# Patient Record
Sex: Female | Born: 1960
Health system: Southern US, Community
[De-identification: ages and names within clinical notes are randomized; demographics above are authoritative.]

## PROBLEM LIST (undated history)

## (undated) DIAGNOSIS — T7840XA Allergy, unspecified, initial encounter: Secondary | ICD-10-CM

## (undated) DIAGNOSIS — S73192A Other sprain of left hip, initial encounter: Secondary | ICD-10-CM

## (undated) DIAGNOSIS — E041 Nontoxic single thyroid nodule: Secondary | ICD-10-CM

## (undated) DIAGNOSIS — M1990 Unspecified osteoarthritis, unspecified site: Secondary | ICD-10-CM

## (undated) DIAGNOSIS — IMO0002 Reserved for concepts with insufficient information to code with codable children: Secondary | ICD-10-CM

## (undated) DIAGNOSIS — C801 Malignant (primary) neoplasm, unspecified: Secondary | ICD-10-CM

## (undated) DIAGNOSIS — M48061 Spinal stenosis, lumbar region without neurogenic claudication: Secondary | ICD-10-CM

## (undated) DIAGNOSIS — S83206A Unspecified tear of unspecified meniscus, current injury, right knee, initial encounter: Secondary | ICD-10-CM

## (undated) HISTORY — DX: Malignant (primary) neoplasm, unspecified: C80.1

## (undated) HISTORY — PX: FOOT SURGERY: SHX648

## (undated) HISTORY — DX: Unspecified osteoarthritis, unspecified site: M19.90

## (undated) HISTORY — DX: Allergy, unspecified, initial encounter: T78.40XA

## (undated) HISTORY — DX: Other sprain of left hip, initial encounter: S73.192A

## (undated) HISTORY — DX: Spinal stenosis, lumbar region without neurogenic claudication: M48.061

## (undated) HISTORY — DX: Nontoxic single thyroid nodule: E04.1

## (undated) HISTORY — DX: Reserved for concepts with insufficient information to code with codable children: IMO0002

## (undated) HISTORY — PX: HAND SURGERY: SHX662

## (undated) HISTORY — DX: Unspecified tear of unspecified meniscus, current injury, right knee, initial encounter: S83.206A

## (undated) HISTORY — PX: KNEE ARTHROPLASTY: SHX992

---

## 1986-03-15 HISTORY — PX: DILATION AND CURETTAGE OF UTERUS: SHX78

## 1986-03-15 HISTORY — PX: LAPAROTOMY: SHX154

## 1987-03-16 HISTORY — PX: ANKLE SURGERY: SHX546

## 1999-04-15 ENCOUNTER — Other Ambulatory Visit: Admission: RE | Admit: 1999-04-15 | Discharge: 1999-04-15 | Payer: Self-pay | Admitting: Obstetrics and Gynecology

## 2000-04-29 ENCOUNTER — Other Ambulatory Visit: Admission: RE | Admit: 2000-04-29 | Discharge: 2000-04-29 | Payer: Self-pay | Admitting: Obstetrics and Gynecology

## 2001-05-26 ENCOUNTER — Other Ambulatory Visit: Admission: RE | Admit: 2001-05-26 | Discharge: 2001-05-26 | Payer: Self-pay | Admitting: Obstetrics and Gynecology

## 2002-07-20 ENCOUNTER — Other Ambulatory Visit: Admission: RE | Admit: 2002-07-20 | Discharge: 2002-07-20 | Payer: Self-pay | Admitting: Obstetrics and Gynecology

## 2003-08-07 ENCOUNTER — Other Ambulatory Visit: Admission: RE | Admit: 2003-08-07 | Discharge: 2003-08-07 | Payer: Self-pay | Admitting: Obstetrics and Gynecology

## 2003-12-02 ENCOUNTER — Encounter: Payer: Self-pay | Admitting: Internal Medicine

## 2004-07-20 ENCOUNTER — Ambulatory Visit: Payer: Self-pay | Admitting: Internal Medicine

## 2004-08-18 ENCOUNTER — Other Ambulatory Visit: Admission: RE | Admit: 2004-08-18 | Discharge: 2004-08-18 | Payer: Self-pay | Admitting: *Deleted

## 2005-03-05 ENCOUNTER — Ambulatory Visit: Payer: Self-pay | Admitting: Internal Medicine

## 2005-08-23 ENCOUNTER — Other Ambulatory Visit: Admission: RE | Admit: 2005-08-23 | Discharge: 2005-08-23 | Payer: Self-pay | Admitting: Obstetrics & Gynecology

## 2005-10-04 ENCOUNTER — Ambulatory Visit: Payer: Self-pay | Admitting: Internal Medicine

## 2006-05-03 ENCOUNTER — Ambulatory Visit: Payer: Self-pay | Admitting: Internal Medicine

## 2006-09-30 ENCOUNTER — Other Ambulatory Visit: Admission: RE | Admit: 2006-09-30 | Discharge: 2006-09-30 | Payer: Self-pay | Admitting: Obstetrics and Gynecology

## 2006-11-04 ENCOUNTER — Ambulatory Visit: Payer: Self-pay | Admitting: Internal Medicine

## 2006-11-15 ENCOUNTER — Encounter (INDEPENDENT_AMBULATORY_CARE_PROVIDER_SITE_OTHER): Payer: Self-pay | Admitting: *Deleted

## 2006-11-16 ENCOUNTER — Ambulatory Visit: Payer: Self-pay | Admitting: Internal Medicine

## 2006-11-17 ENCOUNTER — Encounter (INDEPENDENT_AMBULATORY_CARE_PROVIDER_SITE_OTHER): Payer: Self-pay | Admitting: *Deleted

## 2006-12-05 ENCOUNTER — Encounter: Admission: RE | Admit: 2006-12-05 | Discharge: 2006-12-05 | Payer: Self-pay | Admitting: Obstetrics & Gynecology

## 2006-12-08 ENCOUNTER — Encounter: Admission: RE | Admit: 2006-12-08 | Discharge: 2006-12-08 | Payer: Self-pay | Admitting: Obstetrics & Gynecology

## 2007-01-09 ENCOUNTER — Encounter: Payer: Self-pay | Admitting: Internal Medicine

## 2007-09-22 ENCOUNTER — Ambulatory Visit: Payer: Self-pay | Admitting: Internal Medicine

## 2007-11-22 ENCOUNTER — Other Ambulatory Visit: Admission: RE | Admit: 2007-11-22 | Discharge: 2007-11-22 | Payer: Self-pay | Admitting: Obstetrics & Gynecology

## 2007-11-22 ENCOUNTER — Encounter: Payer: Self-pay | Admitting: Internal Medicine

## 2007-12-01 ENCOUNTER — Encounter: Payer: Self-pay | Admitting: Internal Medicine

## 2007-12-06 ENCOUNTER — Encounter: Admission: RE | Admit: 2007-12-06 | Discharge: 2007-12-06 | Payer: Self-pay | Admitting: Obstetrics and Gynecology

## 2007-12-06 ENCOUNTER — Encounter: Payer: Self-pay | Admitting: Internal Medicine

## 2007-12-20 ENCOUNTER — Ambulatory Visit: Payer: Self-pay | Admitting: Internal Medicine

## 2007-12-22 ENCOUNTER — Encounter: Payer: Self-pay | Admitting: Internal Medicine

## 2008-01-01 ENCOUNTER — Ambulatory Visit: Payer: Self-pay | Admitting: Internal Medicine

## 2008-01-01 DIAGNOSIS — E559 Vitamin D deficiency, unspecified: Secondary | ICD-10-CM | POA: Insufficient documentation

## 2008-01-01 DIAGNOSIS — M159 Polyosteoarthritis, unspecified: Secondary | ICD-10-CM | POA: Insufficient documentation

## 2008-01-01 DIAGNOSIS — N946 Dysmenorrhea, unspecified: Secondary | ICD-10-CM | POA: Insufficient documentation

## 2008-01-08 ENCOUNTER — Encounter (INDEPENDENT_AMBULATORY_CARE_PROVIDER_SITE_OTHER): Payer: Self-pay | Admitting: *Deleted

## 2008-02-13 ENCOUNTER — Ambulatory Visit: Payer: Self-pay | Admitting: Internal Medicine

## 2008-04-12 ENCOUNTER — Encounter: Payer: Self-pay | Admitting: Internal Medicine

## 2008-05-31 ENCOUNTER — Encounter: Payer: Self-pay | Admitting: Internal Medicine

## 2008-06-04 ENCOUNTER — Encounter: Payer: Self-pay | Admitting: Internal Medicine

## 2008-09-02 ENCOUNTER — Telehealth (INDEPENDENT_AMBULATORY_CARE_PROVIDER_SITE_OTHER): Payer: Self-pay | Admitting: *Deleted

## 2008-12-09 ENCOUNTER — Encounter: Admission: RE | Admit: 2008-12-09 | Discharge: 2008-12-09 | Payer: Self-pay | Admitting: Obstetrics and Gynecology

## 2008-12-09 ENCOUNTER — Ambulatory Visit: Payer: Self-pay | Admitting: Internal Medicine

## 2009-04-08 ENCOUNTER — Ambulatory Visit: Payer: Self-pay | Admitting: Internal Medicine

## 2009-04-08 DIAGNOSIS — M2550 Pain in unspecified joint: Secondary | ICD-10-CM | POA: Insufficient documentation

## 2009-04-08 DIAGNOSIS — D485 Neoplasm of uncertain behavior of skin: Secondary | ICD-10-CM | POA: Insufficient documentation

## 2009-04-16 ENCOUNTER — Ambulatory Visit: Payer: Self-pay | Admitting: Internal Medicine

## 2009-04-16 LAB — CONVERTED CEMR LAB
OCCULT 1: NEGATIVE
OCCULT 2: NEGATIVE

## 2009-04-17 ENCOUNTER — Encounter (INDEPENDENT_AMBULATORY_CARE_PROVIDER_SITE_OTHER): Payer: Self-pay | Admitting: *Deleted

## 2009-12-19 ENCOUNTER — Encounter: Admission: RE | Admit: 2009-12-19 | Discharge: 2009-12-19 | Payer: Self-pay | Admitting: Obstetrics and Gynecology

## 2010-01-14 ENCOUNTER — Ambulatory Visit (HOSPITAL_BASED_OUTPATIENT_CLINIC_OR_DEPARTMENT_OTHER): Admission: RE | Admit: 2010-01-14 | Discharge: 2010-01-14 | Payer: Self-pay | Admitting: Orthopedic Surgery

## 2010-03-10 ENCOUNTER — Ambulatory Visit
Admission: RE | Admit: 2010-03-10 | Discharge: 2010-03-10 | Payer: Self-pay | Source: Home / Self Care | Attending: Internal Medicine | Admitting: Internal Medicine

## 2010-03-10 DIAGNOSIS — J019 Acute sinusitis, unspecified: Secondary | ICD-10-CM | POA: Insufficient documentation

## 2010-04-12 LAB — CONVERTED CEMR LAB
ALT: 13 units/L (ref 0–35)
ALT: 17 units/L (ref 0–35)
Albumin: 3.9 g/dL (ref 3.5–5.2)
Albumin: 4.1 g/dL (ref 3.5–5.2)
Alkaline Phosphatase: 47 units/L (ref 39–117)
Alkaline Phosphatase: 56 units/L (ref 39–117)
BUN: 11 mg/dL (ref 6–23)
BUN: 14 mg/dL (ref 6–23)
Basophils Absolute: 0 10*3/uL (ref 0.0–0.1)
Basophils Absolute: 0 10*3/uL (ref 0.0–0.1)
Bilirubin, Direct: 0.1 mg/dL (ref 0.0–0.3)
Bilirubin, Direct: 0.1 mg/dL (ref 0.0–0.3)
Calcium: 9.6 mg/dL (ref 8.4–10.5)
Chloride: 108 meq/L (ref 96–112)
Cholesterol: 155 mg/dL (ref 0–200)
Creatinine, Ser: 0.8 mg/dL (ref 0.4–1.2)
Eosinophils Absolute: 0 10*3/uL (ref 0.0–0.7)
Eosinophils Absolute: 0.1 10*3/uL (ref 0.0–0.6)
Eosinophils Absolute: 0.1 10*3/uL (ref 0.0–0.7)
GFR calc Af Amer: 87 mL/min
GFR calc Af Amer: 87 mL/min
GFR calc non Af Amer: 72 mL/min
GFR calc non Af Amer: 72 mL/min
Glucose, Bld: 91 mg/dL (ref 70–99)
Glucose, Bld: 97 mg/dL (ref 70–99)
HCT: 37.8 % (ref 36.0–46.0)
HCT: 39.1 % (ref 36.0–46.0)
HDL: 49.6 mg/dL (ref >39.0)
HDL: 52.2 mg/dL (ref >39.0)
Hemoglobin: 12.5 g/dL (ref 12.0–15.0)
Lymphocytes Relative: 31.3 % (ref 12.0–46.0)
Lymphs Abs: 1.2 10*3/uL (ref 0.7–4.0)
MCHC: 33.1 g/dL (ref 30.0–36.0)
MCHC: 35 g/dL (ref 30.0–36.0)
MCV: 95 fL (ref 78.0–100.0)
Monocytes Absolute: 0.4 10*3/uL (ref 0.1–1.0)
Monocytes Relative: 8.2 % (ref 3.0–12.0)
Monocytes Relative: 8.6 % (ref 3.0–12.0)
Monocytes Relative: 8.9 % (ref 3.0–11.0)
Neutro Abs: 2.5 10*3/uL (ref 1.4–7.7)
Neutro Abs: 3.6 10*3/uL (ref 1.4–7.7)
Platelets: 252 10*3/uL (ref 150–400)
Platelets: 303 10*3/uL (ref 150–400)
Potassium: 3.6 meq/L (ref 3.5–5.1)
RBC: 4.08 M/uL (ref 3.87–5.11)
RDW: 12 % (ref 11.5–14.6)
RDW: 12.5 % (ref 11.5–14.6)
Sodium: 141 meq/L (ref 135–145)
TSH: 0.76 microintl units/mL (ref 0.35–5.50)
Total Bilirubin: 0.6 mg/dL (ref 0.3–1.2)
Total CHOL/HDL Ratio: 3.1
Total CHOL/HDL Ratio: 3.1
Triglycerides: 54 mg/dL (ref 0.0–149.0)
Triglycerides: 57 mg/dL (ref 0–149)
Triglycerides: 91 mg/dL (ref 0–149)
VLDL: 18 mg/dL (ref 0–40)
Vit D, 25-Hydroxy: 48 ng/mL (ref 30–89)

## 2010-04-14 NOTE — Letter (Signed)
Summary: Results Follow up Letter  [facility]  [address] [place], [place] [address]   Phone: [phone]  Fax: [phone]    04/17/2009 MRN: [id number]  Michele Bennett [address] [place], [place]  [address]  Dear Ms. [name],  The following are the results of your recent test(s):  Test         Result    Pap Smear:        Normal _____  Not Normal _____ Comments: ______________________________________________________ Cholesterol: LDL(Bad cholesterol):         Your goal is less than:         HDL (Good cholesterol):       Your goal is more than: Comments:  ______________________________________________________ Mammogram:        Normal _____  Not Normal _____ Comments:  ___________________________________________________________________ Hemoccult:        Normal _X____  Not normal _______ Comments:    _____________________________________________________________________ Other Tests:    We routinely do not discuss normal results over the telephone.  If you desire a copy of the results, or you have any questions about this information we can discuss them at your next office visit.   Sincerely,

## 2010-04-14 NOTE — Assessment & Plan Note (Signed)
Summary: cpx/alr   Vital Signs:  Patient profile:   50 year old female Height:      68 inches Weight:      169.8 pounds BMI:     25.91 Temp:     99.0 degrees F oral Resp:     14 per minute BP sitting:   104 / 70  (left arm) Cuff size:   large  Vitals Entered By: Shonna Chock (April 08, 2009 8:38 AM)  Comments REVIEWED MED LIST, PATIENT AGREED DOSE AND INSTRUCTION CORRECT    History of Present Illness: Michele Bennett is here for a physical; she has pain & swelling in L great toe. She has seen Dr Lajoyce Corners.  Allergies: No Known Drug Allergies  Past History:  Past Medical History: Dysmennorrhea ( neg [place] 2006); Bigeminy due to excess of  caffeine; DDD of cervical spine on MRI 2005;L hip pain due to labial tear , Dr Julius Bowels ,Orthopedist ,[place] Vitamin D deficiency ( 23 in 11/2007)  Past Surgical History: Surgical repair  R ankle 1989 post fracture Exploratory laparotomy for dysmenorrhe @ age 55; D&C; G0 Thumb surgery X 2; No colonoscopy to date due to lack of coverage for procedure ( her father had polyp in 63s)  Family History: Father:colon polyps, Parkinson's  Mother: breast CA, CAD 90 % lesion @ cath, DJD; MGM DJD Siblings: neg  Social History: Occupation: Chief Financial Officer, [employer] Never Smoked Alcohol use-yes:socially Regular exercise-minimally  Review of Systems       The patient complains of suspicious skin lesions.  The patient denies anorexia, fever, weight loss, weight gain, vision loss, decreased hearing, hoarseness, chest pain, syncope, dyspnea on exertion, peripheral edema, prolonged cough, headaches, hemoptysis, abdominal pain, melena, hematochezia, severe indigestion/heartburn, hematuria, incontinence, depression, unusual weight change, abnormal bleeding, enlarged lymph nodes, and angioedema.         Cold intolerance. New lesion R ear 3-4 months , intermittent tenderness. Perennial nasal congestion with ST.Fexofexadine  of benefit as needed . MS:  Complains of joint pain and  joint swelling; denies joint redness, low back pain, mid back pain, and thoracic pain; Spur w/o gout on Xrays by Dr Lajoyce Corners.  Physical Exam  General:  well-nourished,in no acute distress; alert,appropriate and cooperative throughout examination Head:  Normocephalic and atraumatic without obvious abnormalities. Eyes:  No corneal or conjunctival inflammation noted. Perrla. Funduscopic exam benign, without hemorrhages, exudates or papilledema.  Ears:  External ear exam shows no significant  deformities (see Skin).  Otoscopic examination reveals clear canals, tympanic membranes are intact bilaterally without bulging, retraction, inflammation or discharge. Hearing is grossly normal bilaterally.Mini al scarring R TM Nose:  External nasal examination shows no deformity or inflammation. Nasal mucosa are pink and moist without lesions or exudates. Mouth:  Oral mucosa and oropharynx without lesions or exudates.  Teeth in good repair. Neck:  No deformities, masses, or tenderness noted. Lungs:  Normal respiratory effort, chest expands symmetrically. Lungs are clear to auscultation, no crackles or wheezes. Heart:  Normal rate and regular rhythm. S1 and S2 normal without gallop, murmur, click, rub. S4  Abdomen:  Bowel sounds positive,abdomen soft and non-tender without masses, organomegaly or hernias noted. Aorta palplable w/o AAA Genitalia:  Dr Genice Rouge Msk:  No deformity or scoliosis noted of thoracic or lumbar spine.   Pulses:  R and L carotid,radial,dorsalis pedis and posterior tibial pulses are full and equal bilaterally Extremities:  No clubbing, cyanosis, edema, or deformity noted with normal full range of motion of all joints. MINOR DJD @ DIP joints .  Mild enlargement L great toe joint Neurologic:  alert & oriented X3 and DTRs symmetrical and normal.   Skin:  3X 2 mm slightly pearly lesion R superior ear Cervical Nodes:  No lymphadenopathy noted Axillary Nodes:  No palpable lymphadenopathy Psych:  memory  intact for recent and remote, normally interactive, and good eye contact.     Impression & Recommendations:  Problem # 1:  ROUTINE GENERAL MEDICAL EXAM@HEALTH  CARE FACL (ICD-V70.0)  Orders: Venipuncture ([address]) TLB-Lipid Panel (80061-LIPID) TLB-BMP (Basic Metabolic Panel-BMET) (80048-METABOL) TLB-CBC Platelet - w/Differential (85025-CBCD) TLB-Hepatic/Liver Function Pnl (80076-HEPATIC) TLB-TSH (Thyroid Stimulating Hormone) (84443-TSH) T-Vitamin D (25-Hydroxy) (60454-09811) EKG w/ Interpretation (93000) Dermatology Referral (Derma) Orthopedic Surgeon Referral (Ortho Surgeon) TLB-Uric Acid, Blood (84550-URIC)  Problem # 2:  ARTHRALGIA (ICD-719.40)  ? osteophyte L great toe joint  Orders: Venipuncture (91478) Orthopedic Surgeon Referral (Ortho Surgeon) TLB-Uric Acid, Blood (84550-URIC)  Problem # 3:  VITAMIN D DEFICIENCY (ICD-268.9)  PMH of  Orders: Venipuncture (29562) T-Vitamin D (25-Hydroxy) (13086-57846)  Problem # 4:  NEOPLASM OF UNCERTAIN BEHAVIOR OF SKIN (ICD-238.2)  Orders: Dermatology Referral (Derma)  Complete Medication List: 1)  Tylenol Arthritis Prn Only  2)  Ibuprofen 800 Mg Tabs (Ibuprofen) .Marland Kitchen.. 1 by mouth as needed for menstural cramps 3)  Celebrex 200 Mg Caps (Celecoxib) .... As directed 4)  Vitamin D 96295 Unit Caps (Ergocalciferol) .Marland Kitchen.. 1 by mouth weekly 5)  Meloxicam 7.5 Mg Tabs (Meloxicam) .Marland Kitchen.. 1 by mouth once daily as needed 6)  Amoxicillin-pot Clavulanate 500-125 Mg Tabs (Amoxicillin-pot clavulanate) .Marland Kitchen.. 1 q 12 hrs with food 7)  Fexofenadine Hcl 180 Mg Tabs (Fexofenadine hcl) .Marland Kitchen.. 1 by mouth once daily as needed  Patient Instructions: 1)  Complete stool cards because of family history Prescriptions: FEXOFENADINE HCL 180 MG TABS (FEXOFENADINE HCL) 1 by mouth once daily as needed  #90 x 3   Entered and Authorized by:   Marga Melnick MD   Signed by:   Marga Melnick MD on 04/08/2009   Method used:   Print then Give to Patient   RxID:    [id number] MELOXICAM 7.5 MG TABS (MELOXICAM) 1 by mouth once daily as needed  #30 x 2   Entered and Authorized by:   Marga Melnick MD   Signed by:   Marga Melnick MD on 04/08/2009   Method used:   Print then Give to Patient   RxID:   [id number]

## 2010-04-16 NOTE — Assessment & Plan Note (Signed)
Summary: POSSIBLE STREP/KN   Vital Signs:  Patient profile:   50 year old female Height:      68 inches (172.72 cm) Weight:      171.50 pounds (77.95 kg) BMI:     26.17 O2 Sat:      98 % on Room air Temp:     98.5 degrees F (36.94 degrees C) oral Pulse rate:   67 / minute Resp:     15 per minute BP sitting:   100 / 60  (left arm) Cuff size:   regular  Vitals Entered By: Lucious Groves CMA (March 10, 2010 1:04 PM)  O2 Flow:  Room air CC: Possible strep/sinus inf/kb, URI symptoms Comments Patient notes that she has been having congestion, green mucous production, and HA. She denies fever, cough, SOB, and chest pain.   CC:  Possible strep/sinus inf/kb and URI symptoms.  History of Present Illness:      This is a 50 year old woman who presents with URI symptoms; onset 12/25 as fatigue & headache.  The patient reports nasal congestion, purulent nasal discharge, sore throat, and R  earache, but denies productive cough.  The patient denies fever ( but ? chilled), stiff neck, and dyspnea.  The patient also reports headache , bilateral facial pain and tender adenopathy.  The patient denies the following risk factors for Strep sinusitis: tooth pain.  Strep exposure over holidays. Rx: Sudafed  Current Medications (verified): 1)  Tylenol Arthritis Prn Only 2)  Ibuprofen 800 Mg  Tabs (Ibuprofen) .Marland Kitchen.. 1 By Mouth As Needed For Menstural Cramps 3)  Celebrex 200 Mg Caps (Celecoxib) .... As Directed 4)  Vitamin D 32440 Unit Caps (Ergocalciferol) .Marland Kitchen.. 1 By Mouth Weekly 5)  Meloxicam 7.5 Mg Tabs (Meloxicam) .Marland Kitchen.. 1 By Mouth Once Daily As Needed 6)  Fexofenadine Hcl 180 Mg Tabs (Fexofenadine Hcl) .Marland Kitchen.. 1 By Mouth Once Daily As Needed  Allergies (verified): No Known Drug Allergies  Physical Exam  General:  in no acute distress; alert,appropriate and cooperative throughout examination Ears:  External ear exam shows no significant lesions or deformities.  Otoscopic examination reveals  dull R TM w/o   discharge. Hearing is grossly normal bilaterally. Nose:  External nasal examination shows no deformity or inflammation. Nasal mucosa are pink and moist without lesions or exudates. Mouth:  Oral mucosa and oropharynx without lesions or exudates.  Teeth in good repair. Slightly hoarse Lungs:  Normal respiratory effort, chest expands symmetrically. Lungs are clear to auscultation, no crackles or wheezes. Heart:  Normal rate and regular rhythm. S1 and S2 normal without gallop, murmur, click, rub . S4 Cervical Nodes:  Tender to palpation Axillary Nodes:  No palpable lymphadenopathy   Impression & Recommendations:  Problem # 1:  SINUSITIS- ACUTE-NOS (ICD-461.9)  The following medications were removed from the medication list:    Amoxicillin-pot Clavulanate 500-125 Mg Tabs (Amoxicillin-pot clavulanate) .Marland Kitchen... 1 q 12 hrs with food Her updated medication list for this problem includes:    Amoxicillin 500 Mg Caps (Amoxicillin) .Marland Kitchen... 1 three times a day  Problem # 2:  PHARYNGITIS-ACUTE (ICD-462)  The following medications were removed from the medication list:    Amoxicillin-pot Clavulanate 500-125 Mg Tabs (Amoxicillin-pot clavulanate) .Marland Kitchen... 1 q 12 hrs with food Her updated medication list for this problem includes:    Ibuprofen 800 Mg Tabs (Ibuprofen) .Marland Kitchen... 1 by mouth as needed for menstural cramps    Celebrex 200 Mg Caps (Celecoxib) .Marland Kitchen... As directed    Meloxicam 7.5 Mg Tabs (  Meloxicam) .Marland Kitchen... 1 by mouth once daily as needed    Amoxicillin 500 Mg Caps (Amoxicillin) .Marland Kitchen... 1 three times a day  Orders: Rapid Strep ([address]109): NEGATIVE  Complete Medication List: 1)  Tylenol Arthritis Prn Only  2)  Ibuprofen 800 Mg Tabs (Ibuprofen) .Marland Kitchen.. 1 by mouth as needed for menstural cramps 3)  Celebrex 200 Mg Caps (Celecoxib) .... As directed 4)  Vitamin D 60454 Unit Caps (Ergocalciferol) .Marland Kitchen.. 1 by mouth weekly 5)  Meloxicam 7.5 Mg Tabs (Meloxicam) .Marland Kitchen.. 1 by mouth once daily as needed 6)  Fexofenadine Hcl 180  Mg Tabs (Fexofenadine hcl) .Marland Kitchen.. 1 by mouth once daily as needed 7)  Amoxicillin 500 Mg Caps (Amoxicillin) .Marland Kitchen.. 1 three times a day  Patient Instructions: 1)  Neti pot once daily - two times a day as needed until sinuses are clear. 2)  Drink as much NON dairy  fluid as you can tolerate for the next few days. Prescriptions: AMOXICILLIN 500 MG CAPS (AMOXICILLIN) 1 three times a day  #30 x 0   Entered and Authorized by:   Marga Melnick MD   Signed by:   Marga Melnick MD on 03/10/2010   Method used:   Faxed to ...       [facility] (retail)       [address]       [place], [place]  [address]       Ph: [phone]       Fax: [phone]   RxID:   [id number]    Orders Added: 1)  Est. Patient Level III [27253] 2)  Rapid Strep [66440]

## 2010-07-28 ENCOUNTER — Encounter: Payer: Self-pay | Admitting: Internal Medicine

## 2010-07-29 ENCOUNTER — Encounter: Payer: Self-pay | Admitting: Internal Medicine

## 2010-07-29 ENCOUNTER — Ambulatory Visit (INDEPENDENT_AMBULATORY_CARE_PROVIDER_SITE_OTHER): Payer: 59 | Admitting: Internal Medicine

## 2010-07-29 DIAGNOSIS — D179 Benign lipomatous neoplasm, unspecified: Secondary | ICD-10-CM

## 2010-07-29 DIAGNOSIS — IMO0002 Reserved for concepts with insufficient information to code with codable children: Secondary | ICD-10-CM

## 2010-07-29 DIAGNOSIS — M5410 Radiculopathy, site unspecified: Secondary | ICD-10-CM

## 2010-07-29 NOTE — Progress Notes (Signed)
  Subjective:    Patient ID: Michele Bennett, female    DOB: 11/01/1960, 50 y.o.   MRN: [id number]  HPI  Mass :Onset:07/23/2010; she had  pain in area initially  05/08 but pain has persisted. Constant pain , up to an 8,sensation of  "pulled muscle "when  walking , "burning " @ times w/o trigger,dull & throbbing @ rest sitting. Either improving or "I've gotten used to it". Trigger/injury:no; present after getting out of bed & upon walking  05/10   Redness swelling:no Constitutional no : Fever, chills, sweats, weight change Heme:no  Abnormal bruising or clotting, lymphadenopathy Treatment/response:heating pad  helped pain    Review of Systems     Objective:   Physical Exam she is healthy and well-nourished in appearance; she is in no acute distress.  She has no organomegaly or lymphadenopathy.  The skin is clear without rashes or lesions. There is a pea-sized subcutaneous lesion above the right hip which transilluminates.  She is able to lie down sit up without help. Deep tendon reflexes and strength in  extremities are normal. Gait is also normal. Homan's sign is normal. SLR is negative to > 90 degrees .Full ROM of hip w/o pain        Assessment & Plan:  #1 the mass appears to be a small lipoma which is probably incidental  #2 the pain is suggested to be radicular pain in the T -12 distribution. She will take 800 mg of  Motrin if she is having active pain. If symptoms progress and imaging of the lower thoracic and upper lumbar spine would be pursued.

## 2010-10-06 ENCOUNTER — Ambulatory Visit (INDEPENDENT_AMBULATORY_CARE_PROVIDER_SITE_OTHER): Payer: 59 | Admitting: Internal Medicine

## 2010-10-06 ENCOUNTER — Encounter: Payer: Self-pay | Admitting: Internal Medicine

## 2010-10-06 VITALS — BP 120/74 | Temp 99.1°F | Wt 172.0 lb

## 2010-10-06 DIAGNOSIS — L723 Sebaceous cyst: Secondary | ICD-10-CM

## 2010-10-06 DIAGNOSIS — L0390 Cellulitis, unspecified: Secondary | ICD-10-CM

## 2010-10-06 DIAGNOSIS — L720 Epidermal cyst: Secondary | ICD-10-CM

## 2010-10-06 DIAGNOSIS — L02419 Cutaneous abscess of limb, unspecified: Secondary | ICD-10-CM

## 2010-10-06 DIAGNOSIS — L03119 Cellulitis of unspecified part of limb: Secondary | ICD-10-CM

## 2010-10-06 MED ORDER — AMOXICILLIN-POT CLAVULANATE 500-125 MG PO TABS: 1.0000 | ORAL_TABLET | Freq: Two times a day (BID) | ORAL | Status: AC

## 2010-10-06 NOTE — Progress Notes (Signed)
  Subjective:    Patient ID: Michele Bennett, female    DOB: 1960-08-25, 50 y.o.   MRN: [id number]  HPI She noted redness, swelling, increased temp  and tenderness at the site of the epidermal inclusion cyst at her right waist area on 7/19 upon awakening. By 7/22 she noted some fever &  slight  chills but no sweats. She is treating this with warm compresses; but the lesion appears to be increasing in size.    Review of Systems over Memorial Day weekend she was treated for bronchitis with Zithromax. She has no history of MRSA.     Objective:   Physical Exam clinically she appears in good health; she is in no acute distress.  She has no organomegaly.  At the right superior hip area there is a 2 x 2.5 cm, slightly fluctuant and markedly tender erythematous area.           Assessment & Plan:  #1 cellulitis at the site of an epidermoid inclusion cyst  Plan: Broad-spectrum antibiotics; continuation of warm compresses; Surgical referral for definitive treatment.

## 2010-10-06 NOTE — Patient Instructions (Signed)
continue the warm moist compresses to 3 times a day. Do not manipulate or squeeze the lesion

## 2010-10-09 ENCOUNTER — Encounter (INDEPENDENT_AMBULATORY_CARE_PROVIDER_SITE_OTHER): Payer: Self-pay | Admitting: Surgery

## 2010-10-14 ENCOUNTER — Encounter (INDEPENDENT_AMBULATORY_CARE_PROVIDER_SITE_OTHER): Payer: Self-pay | Admitting: Surgery

## 2010-10-14 ENCOUNTER — Ambulatory Visit (INDEPENDENT_AMBULATORY_CARE_PROVIDER_SITE_OTHER): Payer: 59 | Admitting: Surgery

## 2010-10-14 VITALS — BP 132/92 | HR 72 | Temp 97.6°F | Ht 67.5 in | Wt 169.4 lb

## 2010-10-14 DIAGNOSIS — L723 Sebaceous cyst: Secondary | ICD-10-CM | POA: Insufficient documentation

## 2010-10-14 DIAGNOSIS — L089 Local infection of the skin and subcutaneous tissue, unspecified: Secondary | ICD-10-CM | POA: Insufficient documentation

## 2010-10-14 NOTE — Progress Notes (Signed)
Michele Bennett is a 50 y.o. female.    Chief Complaint  Patient presents with  . Other    new pt- eval cyst    HPI HPI This patient was referred by Dr. Alwyn Ren for evaluation of a sebaceous cyst of the right hip. The patient states that she had a small 1 cm mass in this area for about 2 months. 2 weeks ago she wall cup and this area was red swollen and warm. It became very tender. Dr. Alwyn Ren started the patient on Augmentin which has resolved the tenderness and swelling. There still a little bit of erythema. She presents now for evaluation.  Past Medical History  Diagnosis Date  . Cyst     hip  . Allergy     Past Surgical History  Procedure Date  . Ankle surgery 1989     Post fracture   . Laparotomy 26    For Dysmenorrhe   . Dilation and curettage of uterus   . Hand surgery     Thumb surgery x 2  . Foot surgery     left- bone spurs    Family History  Problem Relation Age of Onset  . Colonic polyp Father   . Parkinsonism Father   . Breast cancer Mother   . Coronary artery disease Mother   . Arthritis Mother     DJD  . Arthritis Maternal Grandmother     DJD    Social History History  Substance Use Topics  . Smoking status: Never Smoker   . Smokeless tobacco: Not on file  . Alcohol Use: Yes    No Known Allergies  Current Outpatient Prescriptions  Medication Sig Dispense Refill  . Acetaminophen (TYLENOL ARTHRITIS PAIN PO) Take by mouth.        . celecoxib (CELEBREX) 200 MG capsule Take 200 mg by mouth as directed.        . fexofenadine (ALLEGRA) 180 MG tablet Take 180 mg by mouth daily.        Marland Kitchen ibuprofen (ADVIL,MOTRIN) 800 MG tablet Take 800 mg by mouth as needed. For menstrual cramps       . amoxicillin-clavulanate (AUGMENTIN) 500-125 MG per tablet Take 1 tablet (500 mg total) by mouth 2 (two) times daily with a meal.  14 tablet  0  . Vitamin D, Ergocalciferol, (DRISDOL) 50000 UNITS CAPS Take 50,000 Units by mouth every 7 (seven) days.          Review  of Systems ROS Essentially negative Physical Exam Physical Exam   Blood pressure 132/92, pulse 72, temperature 97.6 F (36.4 C), height 5' 7.5" (1.715 m), weight 169 lb 6.4 oz (76.839 kg). Skin: - right hip - 2 cm oval shaped area of subcutaneous mass with some mild overlying erythema.  Non-tender Assessment/Plan Infected sebaceous cyst - right hip.  We will allow the remaining inflammation to resolve, then we will excise this in the office under local anesthetic.  Rahshawn Remo K. 10/14/2010, 3:40 PM

## 2010-10-14 NOTE — Patient Instructions (Signed)
We will remove this cyst in our office .

## 2010-10-26 ENCOUNTER — Ambulatory Visit (INDEPENDENT_AMBULATORY_CARE_PROVIDER_SITE_OTHER): Payer: 59

## 2010-10-26 ENCOUNTER — Ambulatory Visit (INDEPENDENT_AMBULATORY_CARE_PROVIDER_SITE_OTHER): Payer: 59 | Admitting: Surgery

## 2010-10-26 ENCOUNTER — Encounter (INDEPENDENT_AMBULATORY_CARE_PROVIDER_SITE_OTHER): Payer: Self-pay | Admitting: Surgery

## 2010-10-26 DIAGNOSIS — L723 Sebaceous cyst: Secondary | ICD-10-CM

## 2010-10-26 NOTE — Progress Notes (Signed)
The patient comes in for excision of the right hip sebaceous cyst.  In the last couple of weeks, this area has essentially resolved.  I cannot palpate a distinct mass, so I don't think we should pursue an excision today.  If this area becomes enlarged or inflamed, I would like to see Michele Bennett back to consider excision.  Otherwise, follow-up on a PRN basis.  No charge for this brief visit

## 2010-10-26 NOTE — Patient Instructions (Signed)
Call us if this area enlarges or becomes inflamed.

## 2010-12-14 ENCOUNTER — Other Ambulatory Visit: Payer: Self-pay | Admitting: Obstetrics and Gynecology

## 2010-12-14 DIAGNOSIS — Z1231 Encounter for screening mammogram for malignant neoplasm of breast: Secondary | ICD-10-CM

## 2011-01-22 ENCOUNTER — Ambulatory Visit
Admission: RE | Admit: 2011-01-22 | Discharge: 2011-01-22 | Disposition: A | Discharge status: Home / Self Care | Payer: 59 | Source: Ambulatory Visit | Attending: Obstetrics and Gynecology | Admitting: Obstetrics and Gynecology

## 2011-01-22 DIAGNOSIS — Z1231 Encounter for screening mammogram for malignant neoplasm of breast: Secondary | ICD-10-CM

## 2011-03-10 ENCOUNTER — Encounter: Payer: Self-pay | Admitting: Internal Medicine

## 2011-03-12 ENCOUNTER — Encounter: Payer: Self-pay | Admitting: Internal Medicine

## 2011-03-12 ENCOUNTER — Ambulatory Visit (INDEPENDENT_AMBULATORY_CARE_PROVIDER_SITE_OTHER): Payer: 59 | Admitting: Internal Medicine

## 2011-03-12 VITALS — BP 112/78 | HR 63 | Temp 98.4°F | Resp 14 | Ht 68.25 in | Wt 170.0 lb

## 2011-03-12 DIAGNOSIS — I4949 Other premature depolarization: Secondary | ICD-10-CM

## 2011-03-12 DIAGNOSIS — I493 Ventricular premature depolarization: Secondary | ICD-10-CM

## 2011-03-12 DIAGNOSIS — Z0000 Encounter for general adult medical examination without abnormal findings: Secondary | ICD-10-CM

## 2011-03-12 LAB — CBC WITH DIFFERENTIAL/PLATELET
Basophils Absolute: 0 10*3/uL (ref 0.0–0.1)
Eosinophils Absolute: 0.1 10*3/uL (ref 0.0–0.7)
Hemoglobin: 13.9 g/dL (ref 12.0–15.0)
Lymphocytes Relative: 27.2 % (ref 12.0–46.0)
Monocytes Relative: 8.8 % (ref 3.0–12.0)
Neutrophils Relative %: 62.3 % (ref 43.0–77.0)
Platelets: 277 10*3/uL (ref 150.0–400.0)
RDW: 13.6 % (ref 11.5–14.6)

## 2011-03-12 LAB — BASIC METABOLIC PANEL
BUN: 14 mg/dL (ref 6–23)
Calcium: 9.7 mg/dL (ref 8.4–10.5)
Creatinine, Ser: 1 mg/dL (ref 0.4–1.2)
GFR: 66.37 mL/min (ref >60.00)
Glucose, Bld: 97 mg/dL (ref 70–99)
Sodium: 140 mEq/L (ref 135–145)

## 2011-03-12 LAB — LIPID PANEL
HDL: 68.7 mg/dL (ref >39.00)
LDL Cholesterol: 114 mg/dL — ABNORMAL HIGH (ref 0–99)
VLDL: 13.2 mg/dL (ref 0.0–40.0)

## 2011-03-12 LAB — HEPATIC FUNCTION PANEL
AST: 21 U/L (ref 0–37)
Alkaline Phosphatase: 51 U/L (ref 39–117)
Bilirubin, Direct: 0 mg/dL (ref 0.0–0.3)
Total Bilirubin: 0.9 mg/dL (ref 0.3–1.2)

## 2011-03-12 LAB — TSH: TSH: 0.74 u[IU]/mL (ref 0.35–5.50)

## 2011-03-12 NOTE — Patient Instructions (Signed)
Consider glucosamine sulfate 1500 mg daily for joint symptoms. Take this daily  for 3 months and then leave it off for 2 months. This will rehydrate the cartilages. To prevent palpitations or premature beats, avoid stimulants such as decongestants, diet pills, nicotine, or caffeine (coffee, tea, cola, or chocolate) to excess.  Preventive Health Care: Exercise  30-45  minutes a day, 3-4 days a week. Walking is especially valuable in preventing Osteoporosis. Eat a low-fat diet with lots of fruits and vegetables, up to 7-9 servings per day. Consume less than 30 grams of sugar per day from foods & drinks with High Fructose Corn Syrup as # 1,2,3 or #4 on label.

## 2011-03-12 NOTE — Progress Notes (Signed)
Subjective:    Patient ID: Michele Bennett, female    DOB: 1961/03/02, 50 y.o.   MRN: [id number]  HPI  Michele Bennett  is here for a physical;acute issues intermittent  R popliteal pain .      Review of Systems Patient reports no vision/ hearing  changes, adenopathy,fever, weight change,  persistant / recurrent hoarseness , swallowing issues, chest pain,edema,persistant /recurrent cough, hemoptysis, dyspnea( rest/ exertional/paroxysmal nocturnal), gastrointestinal bleeding(melena, rectal bleeding), abdominal pain, significant heartburn,  bowel changes,GU symptoms(dysuria, hematuria,pyuria, incontinence), Gyn symptoms(abnormal  bleeding , pain),  syncope, focal weakness, memory loss,numbness & tingling, skin/hair /nail changes,abnormal bruising or bleeding, anxiety,or depression.      Objective:   Physical Exam Gen.: Healthy and well-nourished in appearance. Alert, appropriate and cooperative throughout exam. Head: Normocephalic without obvious abnormalities Eyes: No corneal or conjunctival inflammation noted. Pupils equal round reactive to light and accommodation. Fundal exam is benign without hemorrhages, exudate, papilledema. Extraocular motion intact. Vision grossly normal. Ears: External  ear exam reveals no significant lesions or deformities. Canals clear .TMs normal. Hearing is grossly normal bilaterally. Nose: External nasal exam reveals no deformity or inflammation. Nasal mucosa are pink and moist. No lesions or exudates noted.   Mouth: Oral mucosa and oropharynx reveal no lesions or exudates. Teeth in good repair. Neck: No deformities, masses, or tenderness noted. Range of motion & . Thyroid  normal. Lungs: Normal respiratory effort; chest expands symmetrically. Lungs are clear to auscultation without rales, wheezes, or increased work of breathing. Heart: Normal rate and rhythm. Normal S1 and S2. No gallop, click, or rub. S 4 ; no murmur.Rare extra beat Abdomen: Bowel sounds normal;  abdomen soft and nontender. No masses, organomegaly or hernias noted. Aorta palpable w/o AAA Genitalia: Dr Tresa Res   .                                                                                   Musculoskeletal/extremities: No deformity or scoliosis noted of  the thoracic or lumbar spine. No clubbing, cyanosis, edema noted. Range of motion  normal .Tone & strength  normal.Joints :minor finger changes of DJD. Nail health  Good.Crepitus of knees Vascular: Carotid, radial artery, dorsalis pedis and  posterior tibial pulses are full and equal. No bruits present. Neurologic: Alert and oriented x3. Deep tendon reflexes symmetrical and normal.          Skin: Intact without suspicious lesions or rashes. Lymph: No cervical, axillary lymphadenopathy present. Psych: Mood and affect are normal. Normally interactive                                                                                         Assessment & Plan:  #1 comprehensive physical exam; no acute findings #2 see Problem List with Assessments & Recommendations.  #3 right popliteal space discomfort in the context of degenerative  joint disease of knees. Glucosamine may be of benefit. Plan: see Orders

## 2011-03-13 LAB — VITAMIN D 25 HYDROXY (VIT D DEFICIENCY, FRACTURES): Vit D, 25-Hydroxy: 37 ng/mL (ref 30–89)

## 2011-03-16 HISTORY — PX: KNEE ARTHROSCOPY: SHX127

## 2011-07-14 DIAGNOSIS — S83206A Unspecified tear of unspecified meniscus, current injury, right knee, initial encounter: Secondary | ICD-10-CM

## 2011-07-14 HISTORY — DX: Unspecified tear of unspecified meniscus, current injury, right knee, initial encounter: S83.206A

## 2011-10-06 ENCOUNTER — Other Ambulatory Visit: Payer: Self-pay | Admitting: Orthopedic Surgery

## 2011-10-06 DIAGNOSIS — M25561 Pain in right knee: Secondary | ICD-10-CM

## 2011-10-10 ENCOUNTER — Other Ambulatory Visit: Payer: 59

## 2011-10-11 ENCOUNTER — Ambulatory Visit
Admission: RE | Admit: 2011-10-11 | Discharge: 2011-10-11 | Disposition: A | Discharge status: Home / Self Care | Payer: 59 | Source: Ambulatory Visit | Attending: Orthopedic Surgery | Admitting: Orthopedic Surgery

## 2011-10-11 DIAGNOSIS — M25561 Pain in right knee: Secondary | ICD-10-CM

## 2011-10-25 ENCOUNTER — Encounter: Payer: Self-pay | Admitting: Internal Medicine

## 2011-10-25 ENCOUNTER — Ambulatory Visit (INDEPENDENT_AMBULATORY_CARE_PROVIDER_SITE_OTHER): Payer: 59 | Admitting: Internal Medicine

## 2011-10-25 VITALS — BP 130/82 | HR 71 | Temp 98.7°F | Wt 173.0 lb

## 2011-10-25 DIAGNOSIS — R5383 Other fatigue: Secondary | ICD-10-CM

## 2011-10-25 DIAGNOSIS — J029 Acute pharyngitis, unspecified: Secondary | ICD-10-CM

## 2011-10-25 DIAGNOSIS — R590 Localized enlarged lymph nodes: Secondary | ICD-10-CM

## 2011-10-25 DIAGNOSIS — R599 Enlarged lymph nodes, unspecified: Secondary | ICD-10-CM

## 2011-10-25 DIAGNOSIS — R5381 Other malaise: Secondary | ICD-10-CM

## 2011-10-25 LAB — CBC WITH DIFFERENTIAL/PLATELET
Basophils Absolute: 0 10*3/uL (ref 0.0–0.1)
HCT: 39.3 % (ref 36.0–46.0)
Hemoglobin: 13.1 g/dL (ref 12.0–15.0)
Lymphs Abs: 1.5 10*3/uL (ref 0.7–4.0)
Monocytes Relative: 8.9 % (ref 3.0–12.0)
Neutro Abs: 3.2 10*3/uL (ref 1.4–7.7)
RDW: 13.3 % (ref 11.5–14.6)

## 2011-10-25 LAB — POCT RAPID STREP A (OFFICE): Rapid Strep A Screen: NEGATIVE

## 2011-10-25 NOTE — Patient Instructions (Addendum)
Zicam Melts or Zinc lozenges ; vitamin C 2000 mg daily; & Echinacea for 4-7 days. Report fever, exudate("pus") or progressive pain.  Please try to go on My Chart within the next 24 hours to allow me to release the results directly to you.

## 2011-10-25 NOTE — Progress Notes (Signed)
  Subjective:    Patient ID: Michele Bennett, female    DOB: 1960/07/22, 51 y.o.   MRN: [id number]  HPI She describes fatigue for several months despite sleeping 8 hours or more. Intermittently she's noted some submandibular lymphadenopathy. On 10/23/11 she experienced chills , low-grade fever and a sore throat.    Review of Systems With this present illness she denies associated frontal headache, facial pain, nasal purulence, cough, or sputum production.  She has had a weight gain of 5-6 #. She denies hoarseness, dysphagia, constipation, diarrhea, blurred vision, double vision, or loss of vision. She has noted no new changes in her hair, skin, nails. She is intolerant to cold.  Her TSH has been very stable in the last 3 years with a value less than 1.00     Objective:   Physical Exam Gen.: Healthy and well-nourished in appearance. Alert, appropriate and cooperative throughout exam. Eyes: No corneal or conjunctival inflammation noted.  Extraocular motion intact. No lid lag. Ears: External  ear exam reveals no significant lesions or deformities. Canals clear .TMs normal. Hearing is grossly normal bilaterally. Nose: External nasal exam reveals no deformity or inflammation. Nasal mucosa are pink and moist. No lesions or exudates noted.  Mouth: Oral mucosa and oropharynx reveal no lesions or exudates. Teeth in good repair. Neck: No deformities, masses, or tenderness noted.  Thyroid normal. Lungs: Normal respiratory effort; chest expands symmetrically. Lungs are clear to auscultation without rales, wheezes, or increased work of breathing. Heart: Normal rate and rhythm. Normal S1 and S2. No gallop, click, or rub. No murmur. Abdomen: Bowel sounds normal; abdomen soft and nontender. No masses, organomegaly or hernias noted.Faint aortic bruit ; no AAA .                                                                                Musculoskeletal/extremities: No clubbing, cyanosis, edema, or  deformity noted. Nail health  good. Vascular: Carotid, radial artery, dorsalis pedis and  posterior tibial pulses are full and equal. No bruits present. Neurologic: Alert and oriented x3. Deep tendon reflexes symmetrical and normal. No tremor         Skin: Intact without suspicious lesions or rashes. Lymph: No cervical, axillary lymphadenopathy present. Psych: Mood and affect are normal. Normally interactive                                                                                         Assessment & Plan:  #1 pharyngitis  #2 fatigue with intermittent submandibular lymphadenopathy over 3 months  Plan: See orders & recommendations

## 2011-11-14 LAB — HM MAMMOGRAPHY: HM Mammogram: NORMAL

## 2011-11-14 LAB — HM PAP SMEAR: HM Pap smear: NORMAL

## 2011-12-21 ENCOUNTER — Ambulatory Visit (INDEPENDENT_AMBULATORY_CARE_PROVIDER_SITE_OTHER): Payer: 59

## 2011-12-21 ENCOUNTER — Other Ambulatory Visit: Payer: Self-pay | Admitting: Obstetrics and Gynecology

## 2011-12-21 DIAGNOSIS — Z23 Encounter for immunization: Secondary | ICD-10-CM

## 2011-12-21 DIAGNOSIS — Z1231 Encounter for screening mammogram for malignant neoplasm of breast: Secondary | ICD-10-CM

## 2012-01-24 ENCOUNTER — Ambulatory Visit
Admission: RE | Admit: 2012-01-24 | Discharge: 2012-01-24 | Disposition: A | Discharge status: Home / Self Care | Payer: 59 | Source: Ambulatory Visit | Attending: Obstetrics and Gynecology | Admitting: Obstetrics and Gynecology

## 2012-01-24 DIAGNOSIS — Z1231 Encounter for screening mammogram for malignant neoplasm of breast: Secondary | ICD-10-CM

## 2012-03-24 ENCOUNTER — Encounter: Payer: Self-pay | Admitting: Internal Medicine

## 2012-03-24 ENCOUNTER — Other Ambulatory Visit: Payer: Self-pay | Admitting: *Deleted

## 2012-03-24 ENCOUNTER — Ambulatory Visit (INDEPENDENT_AMBULATORY_CARE_PROVIDER_SITE_OTHER): Payer: 59 | Admitting: Internal Medicine

## 2012-03-24 VITALS — BP 120/72 | HR 110 | Temp 98.1°F | Wt 170.0 lb

## 2012-03-24 DIAGNOSIS — R51 Headache: Secondary | ICD-10-CM

## 2012-03-24 DIAGNOSIS — R519 Headache, unspecified: Secondary | ICD-10-CM

## 2012-03-24 MED ORDER — SUMATRIPTAN SUCCINATE 100 MG PO TABS: ORAL_TABLET | ORAL | Status: DC

## 2012-03-24 NOTE — Patient Instructions (Addendum)
Chronic daily headaches represent  a conversion of migraines into a headache which recurs repeatedly every day .These are almost always  due to the use of excess nonsteroidals such as ibuprofen or naproxen and or caffeine. The nonsteroidals and caffeine should be weaned as quickly as possible; but they should not be "cold turkey" going from a high dose to none.  Please keep a diary of your headaches . Document  each occurrence on the calendar with notation of : #1 any prodrome ( any non headache symptom such as marked fatigue,visual changes, ,etc ) which precedes actual headache ; #2) severity on 1-10 scale; #3) any triggers ( food/ drink,enviromenntal or weather changes ,physical or emotional stress) in 8-12 hour period prior to the headache; & #4) response to any medications or other intervention. Please review "Headache" @ [facility] for additional information.       

## 2012-03-24 NOTE — Progress Notes (Signed)
Rx sent to incorrect pharmacy. Rx resent.

## 2012-03-24 NOTE — Progress Notes (Signed)
  Subjective:    Patient ID: Michele Bennett, female    DOB: 04-10-1960, 52 y.o.   MRN: [id number]  HPI The respiratory tract symptoms began  2 weeks ago  as dull frontal headache; severe pain in frontal area & temples  1/3-4 . Occipital "gripping pain" as of 1/4. Worse with cough, standing up or waist flexion. No better with NSAIDS  & Excedrin Migraine daily since 1/4. Low grade fever ,chills and sweats  present  @ night intermittently x 1 week  Minor cough unassociated with sputum, shortness of breath and wheezing .   There is no history of migraine. The patient had never smoked                   Review of Systems Symptoms not present included  facial pain, dental pain, sore throat, nasal purulence, earache , and otic discharge  Itchy , watery eyes & sneezing were not noted.  Nausea/vomiting: some nausea over 24 hrs 1/3-4  Photophobia/phonophobia:during 1/3-4 Tearing of eyes: no  Family hx migraine: no Personal stressors: no Relation to menstrual cycle:onset 1/3   Neck pain/stiffness: no Vision/speech/swallow/hearing difficulty: tinnitus in ears  Focal weakness/numbness: no Altered mental status:no Trauma: no New type of headache: yes Anticoagulant use: no         Objective:   Physical Exam  Gen. appearance: Well-nourished, in no distress Eyes: Extraocular motion intact, field of vision normal, vision grossly intact, no nystagmus ENT: Canals clear, tympanic membranes normal, tuning fork exam lateralizes to R, AC> BC , hearing grossly normal Neck: Normal range of motion, no masses, normal thyroid Cardiovascular: Rate  (85)and rhythm normal; no murmurs, gallops . S4 Muscle skeletal: Range of motion, tone, &  strength normal Neuro:no cranial nerve deficit, deep tendon  reflexes normal, gait normal. Rhomberg & finger to nose normal. Minimal asymmetry of nasolabial fold Lymph: No cervical or axillary LA Skin: Warm and dry without suspicious lesions or  rashes Psych: no anxiety or mood change. Normally interactive and cooperative.        Assessment & Plan:  #1 migraine variant with conversion to chronic daily headache by nonsteroidals and Excedrin Migraine. This was in the context of onset of menses.

## 2012-03-27 ENCOUNTER — Telehealth: Payer: Self-pay | Admitting: Internal Medicine

## 2012-03-27 MED ORDER — AMOXICILLIN 500 MG PO CAPS: 500.0000 mg | ORAL_CAPSULE | Freq: Three times a day (TID) | ORAL | Status: DC

## 2012-03-27 NOTE — Telephone Encounter (Signed)
Patient Information:  Caller Name: Charlaine  Phone: [phone]  Patient: Michele Bennett, Michele Bennett  Gender: Female  DOB: 30-Mar-1960  Age: 52 Years  PCP: Marga Melnick  Pregnant: No  Office Follow Up:  Does the office need to follow up with this patient?: Yes  Instructions For The Office: May an antibiotic be called in?  Please call pt to advise.  RN Note:  Pt thinks she may have a sinus infection and can she try an antibiotic. She ahs tried Sudaphed this past weekend and got some relief with that.  Requesting can something be called in as she was just seen Friday, 03/25/11?  Symptoms  Reason For Call & Symptoms: Pt was seen by Dr. Alwyn Ren 03/24/12 and she got generic Imitrex, and it did not help at all.  She thinks she may have a sinus infection.   Has pressure in the back of her head with movement.  Reviewed Health History In EMR: Yes  Reviewed Medications In EMR: Yes  Reviewed Allergies In EMR: Yes  Reviewed Surgeries / Procedures: Yes  Date of Onset of Symptoms: 03/13/2012  Treatments Tried: Imitrex  Treatments Tried Worked: No OB / GYN:  LMP: 03/17/2012  Guideline(s) Used:  Headache  Disposition Per Guideline:   See Today or Tomorrow in Office  Reason For Disposition Reached:   Unexplained headache that is present > 24 hours  Advice Given:  Pain Medicines:  For pain relief, you can take either acetaminophen, ibuprofen, or naproxen.  They are over-the-counter (OTC) pain drugs. You can buy them at the drugstore.  Patient Refused Recommendation:  Patient Requests Prescription  May pt try and antibiotic for sinus congestion and see if that helps with her headache?

## 2012-03-27 NOTE — Telephone Encounter (Signed)
Left message to call office

## 2012-03-27 NOTE — Telephone Encounter (Signed)
Discuss with patient, Rx sent. 

## 2012-03-27 NOTE — Telephone Encounter (Signed)
Amoxicillin 500 mg 1 tid #21

## 2012-06-10 ENCOUNTER — Ambulatory Visit (INDEPENDENT_AMBULATORY_CARE_PROVIDER_SITE_OTHER): Payer: 59 | Admitting: Internal Medicine

## 2012-06-10 ENCOUNTER — Encounter: Payer: Self-pay | Admitting: Internal Medicine

## 2012-06-10 VITALS — BP 110/72 | HR 73 | Temp 98.1°F | Ht 67.0 in | Wt 170.0 lb

## 2012-06-10 DIAGNOSIS — J0100 Acute maxillary sinusitis, unspecified: Secondary | ICD-10-CM

## 2012-06-10 MED ORDER — AZITHROMYCIN 250 MG PO TABS: ORAL_TABLET | ORAL | Status: DC

## 2012-06-10 NOTE — Progress Notes (Signed)
Subjective:     Patient ID: Michele Bennett, female   DOB: Apr 19, 1960, 52 y.o.   MRN: [id number]  HPI Michele Bennett is a pleasant 52 y/o woman presenting in the acute clinic for cough with greenish sputum and sinus congestion.  Pt is allergic to mold >> turned pine needles over the weekend >> started to have congestion 4-5 days ago >> cough >> started to have phlegm >> started to become green 3 days ago >> started Mucinex, but did not help. Took Allegra D but decided to come in when sputum turned green. She had a flu vaccine this season. Has SOB at night - positional, due to phlegm accumulation (post-nasal drip). No wheezing. No h/o asthma, PNA. No fever/chills. Has sinus tenderness (maxillary), HAs: R>L, frontal. Has pain with swallowing, believes that this is from coughing. Has some pain in right ear. Has nasal congestion, too.  Pt tells me she has similar episodes ~every 2 years, and usually Z-pack resolves them.  Review of Systems Please see above.    Objective:   Physical Exam BP 110/72  Pulse 73  Temp(Src) 98.1 F (36.7 C) (Oral)  Ht 5\' 7"  (1.702 m)  Wt 170 lb (77.111 kg)  BMI 26.62 kg/m2  SpO2 98% Constitutional: normal, in NAD, hoarse voice, coughing Eyes: PERRLA, EOMI, no exophthalmos ENT: moist mucous membranes, no thyromegaly, no tonsillomegaly, no erythema, slight upper cervical lymphadenopathy; sinus point tenderness more in maxillary sinuses, cannot lateralize; tympanic membranes clear bilaterally; no pain with pulling tragus or with pressing on mastoid. Cardiovascular: RRR, No MRG Respiratory: CTA B Gastrointestinal: abdomen soft, NT, ND, BS+ Skin: moist, warm, no rashes  Assessment:     1. Sinus congestion     Plan:     - likely trigerred by allergen (mold), now with possible bacterial component - no concerning features: fever/chills, mastoid pain, SOB/wheezing - explained that ear pain can be referred from upper cervical LAD - she had similar episodes in the  past, resolved by Z-pack - sent Z-pack to her pharmacy - advised to call us if spx not better in a week

## 2012-06-10 NOTE — Patient Instructions (Signed)
I have sent a z-pack to your pharmacy. Please call us if symptoms are not better in a week.

## 2012-09-12 ENCOUNTER — Telehealth: Payer: Self-pay | Admitting: Internal Medicine

## 2012-09-12 NOTE — Telephone Encounter (Signed)
Patient Information:  Caller Name: Danicia  Phone: [phone]  Patient: Michele Bennett, Michele Bennett  Gender: Female  DOB: 09-24-1960  Age: 52 Years  PCP: Lelon Perla.  Pregnant: No  Office Follow Up:  Does the office need to follow up with this patient?: Yes  Instructions For The Office: Appt not available,  please contact patient at  [phone].   Symptoms  Reason For Call & Symptoms: Poison ivy started few spots on face and neck on Sun 6/29.  Now rash over face, in hairline and on neck.  One spot in crook of arm.  Red ash very itchy but not blisters or draining yet.  Reviewed Health History In EMR: Yes  Reviewed Medications In EMR: Yes  Reviewed Allergies In EMR: Yes  Reviewed Surgeries / Procedures: Yes  Date of Onset of Symptoms: 09/10/2012  Treatments Tried: Benadryl po,     applying witch hazel and Cortisone cream  Treatments Tried Worked: No OB / GYN:  LMP: 08/29/2012  Guideline(s) Used:  Poison Ivy - [place] or [place]  Disposition Per Guideline:   See Today in Office  Reason For Disposition Reached:   Face, eyes, lips or genitals are involved  Advice Given:  Hydrocortisone Cream for Itching:   Apply 1% hydrocortisone cream 4 times a day to reduce itching. Use it for 5 days.  Keep the cream in the refrigerator (Reason: it feels better if applied cold).  Apply Cold to the Area:  Soak the involved area in cool water for 20 minutes or massage it with an ice cube as often as necessary to reduce itching and oozing.  Avoid Scratching:   Cut your fingernails short and try not to scratch so as to prevent a secondary infection from bacteria.  Call Back If:  You become worse.  Patient Will Follow Care Advice:  YES

## 2012-09-12 NOTE — Telephone Encounter (Signed)
Generic Elocon ointment 0.1%(15 grams) can be used 1-2 times per day on all sites except for the face. Nothing stronger than Cort Aid should be used on the face.

## 2012-09-12 NOTE — Telephone Encounter (Signed)
Patient is currently using OTC cortisone cream and witch hazel, patient would like any additional recommendations for OTC meds. Patient is schedule to see Dr.Hopper tomorrow at 11:30 am, patient refused to be seen today at other [facility], patient states she was seen before at another location and had issues with billing.

## 2012-09-12 NOTE — Telephone Encounter (Signed)
Discuss with patient  

## 2012-09-13 ENCOUNTER — Encounter: Payer: Self-pay | Admitting: Internal Medicine

## 2012-09-13 ENCOUNTER — Ambulatory Visit (INDEPENDENT_AMBULATORY_CARE_PROVIDER_SITE_OTHER): Payer: 59 | Admitting: Internal Medicine

## 2012-09-13 VITALS — BP 126/78 | HR 76 | Temp 98.7°F | Wt 168.0 lb

## 2012-09-13 DIAGNOSIS — L255 Unspecified contact dermatitis due to plants, except food: Secondary | ICD-10-CM

## 2012-09-13 MED ORDER — MOMETASONE FUROATE 0.1 % EX OINT: TOPICAL_OINTMENT | Freq: Two times a day (BID) | CUTANEOUS | Status: DC

## 2012-09-13 MED ORDER — PREDNISONE 20 MG PO TABS: 20.0000 mg | ORAL_TABLET | Freq: Two times a day (BID) | ORAL | Status: DC

## 2012-09-13 NOTE — Progress Notes (Signed)
  Subjective:    Patient ID: Michele Bennett, female    DOB: 1960/10/25, 52 y.o.   MRN: [id number]  HPI  Symptoms began as a few isolated lesions on the right temple and cheek which she attributed to a mosquito bites. She now believes this was transmitted from her dogs fur as she had not been working outside  This progressed 6/30 to involve the left neck. Subsequently she noted lesions in the left antecubital area and biceps area.  Lesions are pruritic. She's been applying witch hazel and over-the-counter cortisone.  Generic Elocon was called in; this was not picked up.  She has similar episode 5 years ago.    Review of Systems   She denies associated itchy, watery eyes or sneezing. She's had no shortness of breath or wheezing. She also denies any swelling of her lips or tongue.  There's been no associated fever, chills, or sweats.     Objective:   Physical Exam General appearance:good health ;well nourished; no acute distress or increased work of breathing is present.  No  lymphadenopathy about the head, neck, or axilla noted.   Eyes: No conjunctival inflammation or lid edema is present.   Ears:  External ear exam shows no significant lesions or deformities.  Otoscopic examination reveals clear canals, tympanic membranes are intact bilaterally without bulging, retraction, inflammation or discharge.  Nose:  External nasal examination shows no deformity or inflammation. Nasal mucosa are pink and moist without lesions or exudates. No septal dislocation or deviation.No obstruction to airflow.   Oral exam: Dental hygiene is good; lips and gums are healthy appearing.There is no oropharyngeal erythema or exudate noted.   Neck:  No deformities,  masses, or tenderness noted.     Heart:  Normal rate and regular rhythm. S1 and S2 normal without gallop, murmur, click, rub or other extra sounds.   Lungs:Chest clear to auscultation; no wheezes, rhonchi,rales ,or rubs present.No increased  work of breathing.    Extremities:  No cyanosis, edema, or clubbing  noted    Skin: Warm & dry . She has papular lesions over the base of both neck areas. She has faint papular lesions over the right cheek and temple. Similar lesions are present over the left biceps and left antecubital area. One lesion the antecubital area has begun to blister        Assessment & Plan:  #1 Rhus contact dermatitis  Plan: Over-the-counter cortisone can be used on the face; the stronger preparation can be used on the thorax and arm. She'll be given prescription for oral prednisone if there is not rapid resolution.

## 2012-09-13 NOTE — Patient Instructions (Signed)
Apply  Generic Elocon twice a day to the rash as needed; NOT to face ! 

## 2012-10-19 ENCOUNTER — Ambulatory Visit (INDEPENDENT_AMBULATORY_CARE_PROVIDER_SITE_OTHER): Payer: 59 | Admitting: Internal Medicine

## 2012-10-19 ENCOUNTER — Encounter: Payer: Self-pay | Admitting: Internal Medicine

## 2012-10-19 VITALS — BP 108/78 | HR 68 | Temp 98.2°F | Resp 12 | Ht 68.03 in | Wt 173.0 lb

## 2012-10-19 DIAGNOSIS — Z0000 Encounter for general adult medical examination without abnormal findings: Secondary | ICD-10-CM

## 2012-10-19 DIAGNOSIS — M159 Polyosteoarthritis, unspecified: Secondary | ICD-10-CM

## 2012-10-19 DIAGNOSIS — M5030 Other cervical disc degeneration, unspecified cervical region: Secondary | ICD-10-CM | POA: Insufficient documentation

## 2012-10-19 NOTE — Patient Instructions (Addendum)
As per the Standard of Care , screening Colonoscopy recommended @ 50 & every 5-10 years thereafter . More frequent monitor would be dictated by family history or findings @ Colonoscopy 

## 2012-10-19 NOTE — Progress Notes (Signed)
  Subjective:    Patient ID: Michele Bennett, female    DOB: 10-06-1960, 52 y.o.   MRN: [id number]  HPI   She is here for a physical;acute issues ongoing osteoarthritis/degenerative joint disease involving multiple joints.    Review of Systems  Generic meloxicam and complementary therapies have been of some benefit. The extensive serologic workup performed by her rheumatologist 06/14/12 was reviewed.  The only abnormality was a mildly reduced vitamin D level of 36.     Objective:   Physical Exam  Gen.: Healthy and well-nourished in appearance. Alert, appropriate and cooperative throughout exam. Appears younger than stated age  Head: Normocephalic without obvious abnormalities Eyes: No corneal or conjunctival inflammation noted.  Extraocular motion intact. Vision grossly normal without lenses Ears: External  ear exam reveals no significant lesions or deformities. Canals clear .TMs normal. Hearing is grossly normal bilaterally. Nose: External nasal exam reveals no deformity or inflammation. Nasal mucosa are pink and moist. No lesions or exudates noted.   Mouth: Oral mucosa and oropharynx reveal no lesions or exudates. Teeth in good repair. Neck: No deformities, masses, or tenderness noted. Range of motion decreased. Thyroid normal. Lungs: Normal respiratory effort; chest expands symmetrically. Lungs are clear to auscultation without rales, wheezes, or increased work of breathing. Heart: Normal rate and rhythm. Normal S1 and S2. No gallop, click, or rub. S4 w/o murmur. Abdomen: Bowel sounds normal; abdomen soft and nontender. No masses, organomegaly or hernias noted. Genitalia: As per Gyn                                  Musculoskeletal/extremities: No deformity or scoliosis noted of  the thoracic or lumbar spine.   No clubbing, cyanosis, edema, or significant extremity  deformity noted. Range of motion normal .Tone & strength  Normal. Joints  reveal mild  DJD DIP changes. Nail health  good. Able to lie down & sit up w/o help. Negative SLR bilaterally. Crepitus of knees Vascular: Carotid, radial artery, dorsalis pedis and  posterior tibial pulses are full and equal. No bruits present. Neurologic: Alert and oriented x3. Deep tendon reflexes symmetrical and normal.         Skin: Intact without suspicious lesions or rashes. Lymph: No cervical, axillary lymphadenopathy present. Psych: Mood and affect are normal. Normally interactive                                                                                      Assessment & Plan:  #1 comprehensive physical exam; no acute findings  Plan: see Orders  & Recommendations

## 2012-10-20 ENCOUNTER — Other Ambulatory Visit (INDEPENDENT_AMBULATORY_CARE_PROVIDER_SITE_OTHER): Payer: 59

## 2012-10-20 DIAGNOSIS — Z0000 Encounter for general adult medical examination without abnormal findings: Secondary | ICD-10-CM

## 2012-10-20 LAB — CBC WITH DIFFERENTIAL/PLATELET
Basophils Relative: 0.9 % (ref 0.0–3.0)
Eosinophils Relative: 1.8 % (ref 0.0–5.0)
HCT: 39.3 % (ref 36.0–46.0)
Hemoglobin: 13.1 g/dL (ref 12.0–15.0)
Lymphs Abs: 1.9 10*3/uL (ref 0.7–4.0)
MCV: 96 fl (ref 78.0–100.0)
Monocytes Absolute: 0.5 10*3/uL (ref 0.1–1.0)
Neutro Abs: 3.5 10*3/uL (ref 1.4–7.7)
RBC: 4.09 Mil/uL (ref 3.87–5.11)
WBC: 6 10*3/uL (ref 4.5–10.5)

## 2012-10-20 LAB — BASIC METABOLIC PANEL
BUN: 14 mg/dL (ref 6–23)
Chloride: 105 mEq/L (ref 96–112)
Potassium: 4.1 mEq/L (ref 3.5–5.1)

## 2012-10-20 LAB — HEPATIC FUNCTION PANEL
ALT: 15 U/L (ref 0–35)
Total Protein: 6.5 g/dL (ref 6.0–8.3)

## 2012-10-20 LAB — LIPID PANEL
Cholesterol: 171 mg/dL (ref 0–200)
Triglycerides: 69 mg/dL (ref 0.0–149.0)

## 2012-10-23 ENCOUNTER — Encounter: Payer: Self-pay | Admitting: Internal Medicine

## 2012-11-28 ENCOUNTER — Encounter: Payer: Self-pay | Admitting: Internal Medicine

## 2012-12-20 ENCOUNTER — Other Ambulatory Visit: Payer: Self-pay

## 2012-12-20 ENCOUNTER — Ambulatory Visit (INDEPENDENT_AMBULATORY_CARE_PROVIDER_SITE_OTHER): Payer: 59

## 2012-12-20 DIAGNOSIS — Z23 Encounter for immunization: Secondary | ICD-10-CM

## 2012-12-20 DIAGNOSIS — Z1231 Encounter for screening mammogram for malignant neoplasm of breast: Secondary | ICD-10-CM

## 2012-12-29 ENCOUNTER — Ambulatory Visit: Payer: Self-pay | Admitting: Nurse Practitioner

## 2013-01-25 ENCOUNTER — Ambulatory Visit
Admission: RE | Admit: 2013-01-25 | Discharge: 2013-01-25 | Disposition: A | Discharge status: Home / Self Care | Payer: 59 | Source: Ambulatory Visit

## 2013-01-25 DIAGNOSIS — Z1231 Encounter for screening mammogram for malignant neoplasm of breast: Secondary | ICD-10-CM

## 2013-01-29 ENCOUNTER — Other Ambulatory Visit: Payer: Self-pay | Admitting: Nurse Practitioner

## 2013-01-29 DIAGNOSIS — R928 Other abnormal and inconclusive findings on diagnostic imaging of breast: Secondary | ICD-10-CM

## 2013-02-01 ENCOUNTER — Ambulatory Visit
Admission: RE | Admit: 2013-02-01 | Discharge: 2013-02-01 | Disposition: A | Discharge status: Home / Self Care | Payer: 59 | Source: Ambulatory Visit | Attending: Nurse Practitioner | Admitting: Nurse Practitioner

## 2013-02-01 DIAGNOSIS — R928 Other abnormal and inconclusive findings on diagnostic imaging of breast: Secondary | ICD-10-CM

## 2013-02-02 ENCOUNTER — Encounter: Payer: Self-pay | Admitting: Nurse Practitioner

## 2013-02-02 ENCOUNTER — Ambulatory Visit (INDEPENDENT_AMBULATORY_CARE_PROVIDER_SITE_OTHER): Payer: 59 | Admitting: Nurse Practitioner

## 2013-02-02 VITALS — BP 120/78 | HR 72 | Resp 16 | Ht 67.25 in | Wt 171.0 lb

## 2013-02-02 DIAGNOSIS — Z01419 Encounter for gynecological examination (general) (routine) without abnormal findings: Secondary | ICD-10-CM

## 2013-02-02 DIAGNOSIS — Z1211 Encounter for screening for malignant neoplasm of colon: Secondary | ICD-10-CM

## 2013-02-02 DIAGNOSIS — Z0000 Encounter for general adult medical examination without abnormal findings: Secondary | ICD-10-CM

## 2013-02-02 LAB — POCT URINALYSIS DIPSTICK
Glucose, UA: NEGATIVE
Ketones, UA: NEGATIVE
Leukocytes, UA: NEGATIVE
Urobilinogen, UA: NEGATIVE

## 2013-02-02 NOTE — Patient Instructions (Signed)

## 2013-02-02 NOTE — Progress Notes (Signed)
Patient ID: EVANI SHRIDER, female   DOB: 12-10-1960, 52 y.o.   MRN: [id number] 52 y.o. G0,P0. Single Caucasian Fe here for annual exam.  She has been sexually active with older boyfriend since last visit.  She declines STD's as he has not dated since ending their last relationship.  Menses is regular for the most part.  She had an irregular cycle in July with a cycle that was 2 weeks apart.  Bleeding was also very heavy.  Patient's last menstrual period was 01/21/2013.          Sexually active: no  The current method of family planning is none.    Exercising: yes  walking, gardening Smoker:  no  Health Maintenance: Pap: 12/28/11, WNL, neg HR HPV MMG: 02/01/13, Bi-Rads 2: benign finding  Colonoscopy: never TDaP: 2007 Labs: HB: 12.5 Urine: negative, pH 5.5    reports that she has never smoked. She has never used smokeless tobacco. She reports that she drinks about 1.2 ounces of alcohol per week. She reports that she does not use illicit drugs.  Past Medical History  Diagnosis Date  . Cyst     hip; probable epidemoid inclusion cyst; resolution with antibiotics  . Allergy   . Vitamin D deficiency   . Tear of left acetabular labrum     Dr Julius Bowels  . Right knee meniscal tear  07/2011    Dr Elita Quick, Ortho; S/P cortisone injection    Past Surgical History  Procedure Laterality Date  . Ankle surgery  1989     Post fracture   . Laparotomy  1988    For Dysmenorrhea  . Dilation and curettage of uterus    . Hand surgery      Thumb surgery x 2  . Foot surgery      left- bone spurs, Dr Turner Daniels    Current Outpatient Prescriptions  Medication Sig Dispense Refill  . Cholecalciferol (VITAMIN D3) 2000 UNITS TABS Take by mouth daily.        . fexofenadine (ALLEGRA) 180 MG tablet Take 180 mg by mouth as needed.       Marland Kitchen ibuprofen (ADVIL,MOTRIN) 800 MG tablet Take 800 mg by mouth as needed. For menstrual cramps      . meloxicam (MOBIC) 7.5 MG tablet Take 7.5 mg by mouth daily.      .  TURMERIC PO Take by mouth daily.       No current facility-administered medications for this visit.    Family History  Problem Relation Age of Onset  . Parkinsonism Father   . Colon polyps Father   . Breast cancer Mother   . Coronary artery disease Mother     ? radiation related  . Arthritis Mother     DJD  . Arthritis Maternal Grandmother     DJD  . Diabetes Maternal Grandfather   . Heart attack Neg Hx   . Transient ischemic attack Father     ROS:  Pertinent items are noted in HPI.  Otherwise, a comprehensive ROS was negative.  Exam:   BP 120/78  Pulse 72  Resp 16  Ht 5' 7.25" (1.708 m)  Wt 171 lb (77.565 kg)  BMI 26.59 kg/m2  LMP 01/21/2013 Height: 5' 7.25" (170.8 cm)  Ht Readings from Last 3 Encounters:  02/02/13 5' 7.25" (1.708 m)  10/19/12 5' 8.03" (1.728 m)  06/10/12 5\' 7"  (1.702 m)    General appearance: alert, cooperative and appears stated age Head: Normocephalic, without obvious abnormality, atraumatic Neck:  no adenopathy, supple, symmetrical, trachea midline and thyroid normal to inspection and palpation Lungs: clear to auscultation bilaterally Breasts: normal appearance, no masses or tenderness, FCB changes bilaterally. Heart: regular rate and rhythm Abdomen: soft, non-tender; no masses,  no organomegaly Extremities: extremities normal, atraumatic, no cyanosis or edema Skin: Skin color, texture, turgor normal. No rashes or lesions Lymph nodes: Cervical, supraclavicular, and axillary nodes normal. No abnormal inguinal nodes palpated Neurologic: Grossly normal   Pelvic: External genitalia:  no lesions              Urethra:  normal appearing urethra with no masses, tenderness or lesions              Bartholin's and Skene's: normal                 Vagina: normal appearing vagina with normal color and discharge, no lesions              Cervix: anteverted              Pap taken: no Bimanual Exam:  Uterus:  normal size, contour, position, consistency,  mobility, non-tender              Adnexa: no mass, fullness, tenderness               Rectovaginal: Confirms               Anus:  normal sphincter tone, no lesions  A:  Well Woman with normal exam  Perimenopause consistent with irregular menses - will keep a menstrual record and if no menses > 90 days to call back.  Condoms for birth control if SA  P:   Pap smear as per guidelines not done today - but plan on repeat next year.  Mammogram due 11/15  Will make a referral to Dr. Daryll Drown on breast self exam, adequate intake of calcium and vitamin D, diet and exercise return annually or prn  An After Visit Summary was printed and given to the patient.

## 2013-02-05 NOTE — Progress Notes (Signed)
Encounter reviewed by Dr. Aaima Gaddie Silva.  

## 2013-02-05 NOTE — Progress Notes (Signed)
Quick Note:  Removed from hold.   ______

## 2013-03-20 ENCOUNTER — Telehealth: Payer: Self-pay | Admitting: Nurse Practitioner

## 2013-03-20 NOTE — Telephone Encounter (Signed)
Spoke with pt and apologized for miscommunication regarding colonoscopy referral. Advised will send info to Dr. Collene Mares today. Pt requests consult visit at a time other than Tuesday or Thursday mornings, hopefully before March when her insurance runs out. Will contact pt with appt as soon as received. Pt appreciative.

## 2013-03-20 NOTE — Telephone Encounter (Signed)
Pt states someone was suppose to call her with a referral for a Colonoscopy. She states she has been waiting since 02/02/2013

## 2013-03-21 ENCOUNTER — Telehealth: Payer: Self-pay | Admitting: *Deleted

## 2013-03-21 NOTE — Telephone Encounter (Signed)
error 

## 2013-03-23 ENCOUNTER — Ambulatory Visit: Payer: 59 | Admitting: Internal Medicine

## 2013-05-28 ENCOUNTER — Other Ambulatory Visit: Payer: Self-pay | Admitting: Nurse Practitioner

## 2013-05-28 NOTE — Telephone Encounter (Signed)
Patient needs refill of  Ibuprofen (ADVIL,MOTRIN) 800 MG tablet  Take 800 mg by mouth as needed. For menstrual cramps, Last Dose: Not Recorded   Kristopher Oppenheim [phone]

## 2013-05-28 NOTE — Telephone Encounter (Signed)
Last AEX 02/02/2013 Last refill 12/28/2011 #30/12 refills Next appt 02/12/2014  Paper Chart in your door Please approve or deny rx.

## 2013-05-29 MED ORDER — IBUPROFEN 800 MG PO TABS: 800.0000 mg | ORAL_TABLET | ORAL | Status: DC | PRN

## 2013-05-29 NOTE — Telephone Encounter (Addendum)
-  LM for pt re: rx sent to [facility].

## 2014-01-07 ENCOUNTER — Other Ambulatory Visit: Payer: Self-pay

## 2014-01-07 DIAGNOSIS — Z1231 Encounter for screening mammogram for malignant neoplasm of breast: Secondary | ICD-10-CM

## 2014-01-23 ENCOUNTER — Ambulatory Visit (INDEPENDENT_AMBULATORY_CARE_PROVIDER_SITE_OTHER)
Admission: RE | Admit: 2014-01-23 | Discharge: 2014-01-23 | Disposition: A | Discharge status: Home / Self Care | Payer: BC Managed Care – PPO | Source: Ambulatory Visit | Attending: Internal Medicine | Admitting: Internal Medicine

## 2014-01-23 ENCOUNTER — Encounter: Payer: Self-pay | Admitting: Internal Medicine

## 2014-01-23 ENCOUNTER — Other Ambulatory Visit (INDEPENDENT_AMBULATORY_CARE_PROVIDER_SITE_OTHER): Payer: BC Managed Care – PPO

## 2014-01-23 ENCOUNTER — Ambulatory Visit (INDEPENDENT_AMBULATORY_CARE_PROVIDER_SITE_OTHER): Payer: BC Managed Care – PPO | Admitting: Internal Medicine

## 2014-01-23 VITALS — BP 116/60 | HR 74 | Temp 99.1°F | Wt 160.5 lb

## 2014-01-23 DIAGNOSIS — R208 Other disturbances of skin sensation: Secondary | ICD-10-CM

## 2014-01-23 DIAGNOSIS — R200 Anesthesia of skin: Secondary | ICD-10-CM

## 2014-01-23 DIAGNOSIS — M5416 Radiculopathy, lumbar region: Secondary | ICD-10-CM

## 2014-01-23 LAB — TSH: TSH: 0.67 u[IU]/mL (ref 0.35–4.50)

## 2014-01-23 LAB — RPR

## 2014-01-23 LAB — VITAMIN B12: Vitamin B-12: 276 pg/mL (ref 211–911)

## 2014-01-23 NOTE — Progress Notes (Signed)
Pre visit review using our clinic review tool, if applicable. No additional management support is needed unless otherwise documented below in the visit note. 

## 2014-01-23 NOTE — Progress Notes (Signed)
   Subjective:    Patient ID: ISA HITZ, female    DOB: 1961-02-01, 53 y.o.   MRN: [id number]  HPI She describes localized numbness at the distal left thigh above the knee since September. There was no definite trigger or injury for this. It is constant and associated with decreased sensation to cold. She feels she has heightened sensitivity to heat.  She has had intermittent low back pain on the left. This is in context of degenerative joint disease at multiple sites treated and followed by a Rheumatologist.  Evaluation has included normal uric acid levels, angiotensin-converting enzyme, HLA- 27 testing, rheumatoid arthritis factor, ANA, and CCP. She is on Meloxicam as needed.  She does have vitamin D deficiency; her level in August was therapeutic.  She has no genitourinary or constitutional symptoms.    Review of Systems  Constitutional: No fever, chills, significant weight change, fatigue, weakness or night sweats Genitourinary: No dysuria,hematuria, pyuria, frequency, urgency,  incontinence, nocturia, dark urine or flank pain Musculoskeletal: No myalgias or muscle cramping, joint stiffness, joint swelling, joint color change, weakness, or cyanosis Dermatologic: No rash, pruritus, urticaria, or change in color or temperature of skin Neurologic: No  limb weakness, tremor, gait disturbance.     Objective:   Physical Exam   Pertinent or positive findings include: She has negative straight leg raising past 90 bilaterally. Strength, tone, deep tendon reflexes are normal. She does have decreased sensation to touch over the left distal anterior lateral thigh above the knee. Gait including heel and toe walking is normal. She has minor DIP osteoarthritic changes.  General appearance :adequately nourished; in no distress. Eyes: No conjunctival inflammation or scleral icterus is present. Heart:  Normal rate and regular rhythm. S1 and S2 normal without gallop, murmur, click,  rub or other extra sounds  Lungs:Chest clear to auscultation; no wheezes, rhonchi,rales ,or rubs present.No increased work of breathing.  Abdomen: bowel sounds normal, soft and non-tender without masses, organomegaly or hernias noted.Aorta palpable ; no AAA. No guarding or rebound. No flank tenderness to percussion. Vascular : all pulses equal ; no bruits present. Skin:Warm & dry.  Intact without suspicious lesions or rashes ; no jaundice or tenting Lymphatic: No lymphadenopathy is noted about the head, neck, axilla             Assessment & Plan:  #1 localized numbness in L4 distribution  Plan: See orders and recommendations

## 2014-01-23 NOTE — Patient Instructions (Signed)
Your next office appointment will be determined based upon review of your pending labs & x-rays. Those instructions will be transmitted to you through My Chart   Followup as needed for your acute issue. Please report any significant change in your symptoms. 

## 2014-01-24 ENCOUNTER — Other Ambulatory Visit: Payer: Self-pay | Admitting: Internal Medicine

## 2014-01-24 DIAGNOSIS — E538 Deficiency of other specified B group vitamins: Secondary | ICD-10-CM

## 2014-01-28 ENCOUNTER — Ambulatory Visit: Payer: 59

## 2014-02-05 ENCOUNTER — Ambulatory Visit
Admission: RE | Admit: 2014-02-05 | Discharge: 2014-02-05 | Disposition: A | Discharge status: Home / Self Care | Payer: BC Managed Care – PPO | Source: Ambulatory Visit

## 2014-02-05 DIAGNOSIS — Z1231 Encounter for screening mammogram for malignant neoplasm of breast: Secondary | ICD-10-CM

## 2014-02-12 ENCOUNTER — Ambulatory Visit: Payer: 59 | Admitting: Nurse Practitioner

## 2014-03-06 ENCOUNTER — Other Ambulatory Visit (HOSPITAL_BASED_OUTPATIENT_CLINIC_OR_DEPARTMENT_OTHER): Payer: Self-pay | Admitting: Rheumatology

## 2014-03-06 DIAGNOSIS — M5137 Other intervertebral disc degeneration, lumbosacral region: Secondary | ICD-10-CM

## 2014-03-16 ENCOUNTER — Ambulatory Visit (HOSPITAL_BASED_OUTPATIENT_CLINIC_OR_DEPARTMENT_OTHER): Payer: BC Managed Care – PPO

## 2014-03-18 ENCOUNTER — Encounter: Payer: Self-pay | Admitting: Nurse Practitioner

## 2014-03-18 ENCOUNTER — Ambulatory Visit (INDEPENDENT_AMBULATORY_CARE_PROVIDER_SITE_OTHER): Payer: BLUE CROSS/BLUE SHIELD | Admitting: Nurse Practitioner

## 2014-03-18 VITALS — BP 120/76 | HR 64 | Ht 67.75 in | Wt 162.0 lb

## 2014-03-18 DIAGNOSIS — R5383 Other fatigue: Secondary | ICD-10-CM

## 2014-03-18 DIAGNOSIS — Z0000 Encounter for general adult medical examination without abnormal findings: Secondary | ICD-10-CM

## 2014-03-18 DIAGNOSIS — Z01419 Encounter for gynecological examination (general) (routine) without abnormal findings: Secondary | ICD-10-CM

## 2014-03-18 LAB — POCT URINALYSIS DIPSTICK
Bilirubin, UA: NEGATIVE
Blood, UA: NEGATIVE
Glucose, UA: NEGATIVE
Ketones, UA: NEGATIVE
Leukocytes, UA: NEGATIVE
Nitrite, UA: NEGATIVE
Protein, UA: NEGATIVE
Urobilinogen, UA: NEGATIVE
pH, UA: 6.5

## 2014-03-18 NOTE — Patient Instructions (Signed)

## 2014-03-18 NOTE — Progress Notes (Signed)
Patient ID: Michele Bennett, female   DOB: 10/19/1960, 54 y.o.   MRN: [id number] 54 y.o. G0P0 Single Caucasian Fe here for annual exam.  Menses more regular but heavy for 3-4 days. Blood is dark and more thick with clotting.   Changing regular tampon every 2 hours.  She is also feeling quite tired and had Vit B 12 level done by Dr. Linna Darner in November.  Her level was low and has been on supplements.  She desires to get rechecked today along with Vit D.  Not dating or SA. Still has pain with left back and pain in left leg.  Will be having MRI in the near future.   Father is now under care of Hospice for cancer.  He is unable to speak but is alert enough to write notes.  He is 86.  Patient's last menstrual period was 03/09/2014 (exact date).          Sexually active: no  The current method of family planning is none.  Exercising: yes walking, gardening Smoker: no  Health Maintenance: Pap: 12/28/11, WNL, neg HR HPV MMG: 02/06/14, Bi-Rads 1:  Negative   Colonoscopy:  06/12/13, normal, repeat in 7 years due to family history of colon polyps TDaP: 12/2005 Labs:  HB:  12.5  Urine:  Negative    reports that she has never smoked. She has never used smokeless tobacco. She reports that she drinks about 1.2 - 1.8 oz of alcohol per week. She reports that she does not use illicit drugs.  Past Medical History  Diagnosis Date  . Cyst     hip; probable epidemoid inclusion cyst; resolution with antibiotics  . Allergy   . Vitamin D deficiency   . Tear of left acetabular labrum 2006 ?    Dr Sallyanne Havers  . Right knee meniscal tear  07/2011    Dr Hal Morales, Ortho; S/P cortisone injection    Past Surgical History  Procedure Laterality Date  . Ankle surgery  1989     Post fracture   . Laparotomy  1988    For Dysmenorrhea  . Hand surgery      Thumb surgery x 2  . Foot surgery      left- bone spurs, Dr Mayer Camel  . Dilation and curettage of uterus  1988  . Knee arthroscopy Right 2013    Current  Outpatient Prescriptions  Medication Sig Dispense Refill  . Cholecalciferol (VITAMIN D3) 2000 UNITS TABS Take by mouth daily.      . fexofenadine (ALLEGRA) 180 MG tablet Take 180 mg by mouth as needed.     Marland Kitchen ibuprofen (ADVIL,MOTRIN) 800 MG tablet Take 1 tablet (800 mg total) by mouth as needed. For menstrual cramps 30 tablet 6  . meloxicam (MOBIC) 7.5 MG tablet Take 7.5 mg by mouth daily.    . Misc Natural Products (TART CHERRY ADVANCED PO) Take by mouth.    . TURMERIC PO Take by mouth daily.     No current facility-administered medications for this visit.    Family History  Problem Relation Age of Onset  . Parkinsonism Father   . Colon polyps Father   . Transient ischemic attack Father   . Breast cancer Mother 58  . Coronary artery disease Mother     ? radiation related  . Arthritis Mother     DJD  . Arthritis Maternal Grandmother     DJD  . Diabetes Maternal Grandfather   . Heart attack Neg Hx     ROS:  Pertinent items are noted in HPI.  Otherwise, a comprehensive ROS was negative.  Exam:   BP 120/76 mmHg  Pulse 64  Ht 5' 7.75" (1.721 m)  Wt 162 lb (73.483 kg)  BMI 24.81 kg/m2  LMP 03/09/2014 (Exact Date) Height: 5' 7.75" (172.1 cm)  Ht Readings from Last 3 Encounters:  03/18/14 5' 7.75" (1.721 m)  02/02/13 5' 7.25" (1.708 m)  10/19/12 5' 8.03" (1.728 m)    General appearance: alert, cooperative and appears stated age Head: Normocephalic, without obvious abnormality, atraumatic Neck: no adenopathy, supple, symmetrical, trachea midline and thyroid normal to inspection and palpation Lungs: clear to auscultation bilaterally Breasts: normal appearance, no masses or tenderness Heart: regular rate and rhythm Abdomen: soft, non-tender; no masses,  no organomegaly Extremities: extremities normal, atraumatic, no cyanosis or edema Skin: Skin color, texture, turgor normal. No rashes or lesions Lymph nodes: Cervical, supraclavicular, and axillary nodes normal. No abnormal  inguinal nodes palpated Neurologic: Grossly normal   Pelvic: External genitalia:  no lesions              Urethra:  normal appearing urethra with no masses, tenderness or lesions              Bartholin's and Skene's: normal                 Vagina: normal appearing vagina with normal color and discharge, no lesions              Cervix: anteverted              Pap taken: Yes.   Bimanual Exam:  Uterus:  normal size, contour, position, consistency, mobility, non-tender              Adnexa: no mass, fullness, tenderness               Rectovaginal: Confirms               Anus:  normal sphincter tone, no lesions  A:  Well Woman with normal exam  Perimenopausal - now with more regular cycles Condoms for birth control if SA   P:   Reviewed health and wellness pertinent to exam  Pap smear taken today  Mammogram is due 11/16  Will follow with labs  Counseled on breast self exam, mammography screening, adequate intake of calcium and vitamin D, diet and exercise return annually or prn  An After Visit Summary was printed and given to the patient.

## 2014-03-19 LAB — VITAMIN B12: Vitamin B-12: 605 pg/mL (ref 211–911)

## 2014-03-19 LAB — HEMOGLOBIN, FINGERSTICK: HEMOGLOBIN, FINGERSTICK: 12.5 g/dL (ref 12.0–16.0)

## 2014-03-19 LAB — VITAMIN D 25 HYDROXY (VIT D DEFICIENCY, FRACTURES): Vit D, 25-Hydroxy: 31 ng/mL (ref 30–100)

## 2014-03-19 NOTE — Progress Notes (Signed)
Encounter reviewed by Dr. Alfard Cochrane Silva.  

## 2014-03-20 ENCOUNTER — Ambulatory Visit (HOSPITAL_BASED_OUTPATIENT_CLINIC_OR_DEPARTMENT_OTHER): Payer: BLUE CROSS/BLUE SHIELD

## 2014-03-20 LAB — IPS PAP TEST WITH HPV

## 2014-08-08 ENCOUNTER — Ambulatory Visit (INDEPENDENT_AMBULATORY_CARE_PROVIDER_SITE_OTHER): Payer: BLUE CROSS/BLUE SHIELD

## 2014-08-08 ENCOUNTER — Ambulatory Visit (INDEPENDENT_AMBULATORY_CARE_PROVIDER_SITE_OTHER): Payer: BLUE CROSS/BLUE SHIELD | Admitting: Podiatry

## 2014-08-08 ENCOUNTER — Encounter: Payer: Self-pay | Admitting: Podiatry

## 2014-08-08 VITALS — BP 117/71 | HR 81 | Resp 16

## 2014-08-08 DIAGNOSIS — M722 Plantar fascial fibromatosis: Secondary | ICD-10-CM

## 2014-08-08 MED ORDER — METHYLPREDNISOLONE 4 MG PO TBPK: ORAL_TABLET | ORAL | Status: DC

## 2014-08-08 NOTE — Patient Instructions (Signed)

## 2014-08-08 NOTE — Progress Notes (Signed)
   Subjective:    Patient ID: Michele Bennett, female    DOB: 20-May-1960, 54 y.o.   MRN: [id number]  HPI Comments: "I have pain in the heel"  Patient c/o sharp plantar heel left for about 3 weeks. She have some AM pain. She has been in high heels for a few events lately. There is a pinpoint spot on the heel that is painful. She has tried OTC meds and ice. She does have a bulging disc which causes numbness in back. Has had problems with PF in the past.  Foot Pain Associated symptoms include arthralgias and numbness.      Review of Systems  Musculoskeletal: Positive for back pain and arthralgias.  Allergic/Immunologic: Positive for environmental allergies.  Neurological: Positive for numbness.  Hematological: Bruises/bleeds easily.  All other systems reviewed and are negative.      Objective:   Physical Exam: I have reviewed her past medical history medications allergies surgery social history and review systems review pulses are strongly palpable left foot. Neurologic sensorium is intact per Semmes-Weinstein monofilament. Deep tendon reflexes are intact bilateral and muscle strength +5 over 5 dorsiflexion plantar flexors and inverters E Burns MUSCULATURE is intact. Orthopedic evaluation industries all joints distal to the angle full range of motion without crepitus has pain on palpation medial calcaneal tubercle of the left heel. Radiograph confirms small plantar distally oriented to continue [place] soft tissue increased density at the plantar fascial calcaneal insertion site.        Assessment & Plan:  Assessment: Plantar fasciitis left.  Plan: Discussed etiology pathology conservative versus surgical therapies. At this point I injected her left heel today. Started her on a Medrol Dosepak to be followed by meloxicam. Also dispensed a myofascial brace and try to dispensed night splint however she refused the night splint. We discussed appropriate shoe gear stretching exercises  ice therapy and shoe modifications.

## 2014-09-05 ENCOUNTER — Ambulatory Visit: Payer: BLUE CROSS/BLUE SHIELD | Admitting: Podiatry

## 2015-01-02 ENCOUNTER — Encounter: Payer: Self-pay | Admitting: Internal Medicine

## 2015-01-02 ENCOUNTER — Ambulatory Visit (INDEPENDENT_AMBULATORY_CARE_PROVIDER_SITE_OTHER): Payer: BLUE CROSS/BLUE SHIELD | Admitting: Internal Medicine

## 2015-01-02 VITALS — BP 118/70 | HR 66 | Temp 98.3°F | Resp 16 | Ht 67.0 in | Wt 161.0 lb

## 2015-01-02 DIAGNOSIS — Z0000 Encounter for general adult medical examination without abnormal findings: Secondary | ICD-10-CM

## 2015-01-02 DIAGNOSIS — Z23 Encounter for immunization: Secondary | ICD-10-CM | POA: Diagnosis not present

## 2015-01-02 MED ORDER — DICLOFENAC SODIUM 1 % TD GEL: 4.0000 g | Freq: Four times a day (QID) | TRANSDERMAL | Status: AC

## 2015-01-02 NOTE — Patient Instructions (Signed)
  Your next office appointment will be determined based upon review of your pending labs. Those written interpretation of the lab results and instructions will be transmitted to you by My Chart  Critical results will be called.   Followup as needed for any active or acute issue. Please report any significant change in your symptoms. 

## 2015-01-02 NOTE — Progress Notes (Signed)
Pre visit review using our clinic review tool, if applicable. No additional management support is needed unless otherwise documented below in the visit note. 

## 2015-01-02 NOTE — Progress Notes (Signed)
   Subjective:    Patient ID: Michele Bennett, female    DOB: 1960-04-06, 54 y.o.   MRN: [id number]  HPI The patient is here for a physical to assess status of active health conditions.  PMH, FH, & Social History reviewed & updated.No change in [place] as recorded.  She's been compliant with medications without adverse effects. She's on a heart healthy diet. She walks, gardens, and does stretches 10-15 hours per week. She denies cardio pulmonary symptoms.  Colonoscopy was performed 2013; follow-up was recommended in 7 years as her father has colon polyps. She has no GI symptoms.  She has had a bulging disc at L4-5 and received epidural steroid injections with Dr. Ellene Route. She has occasional numbness and tingling in the left lower extremity. She has no other neuromuscular deficit  She does describe possible polydipsia  She has some hair loss. She's also received steroid injections for plantar fasciitis.  Review of Systems  Chest pain, palpitations, tachycardia, exertional dyspnea, paroxysmal nocturnal dyspnea, claudication or edema are absent. No unexplained weight loss, abdominal pain, significant dyspepsia, dysphagia, melena, rectal bleeding, or persistently small caliber stools. Dysuria, pyuria, hematuria, frequency, nocturia or polyuria are denied. Change in skin or nails denied. No bowel changes of constipation or diarrhea. No intolerance to heat or cold. She denies any altered gait except related to the plantar fasciitis. She has no loss of control of bladder or bowels.    Objective:   Physical Exam  Pertinent or positive findings include: She has minor DIP changes in the hands. She has marked crepitus of the knees bilaterally. Straight leg raising is negative bilaterally to 90. Aorta is palpable; there is no aneurysm.  General appearance :adequately nourished; in no distress.  Eyes: No conjunctival inflammation or scleral icterus is present.  Oral exam:  Lips and gums are healthy  appearing.There is no oropharyngeal erythema or exudate noted. Dental hygiene is good.  Heart:  Normal rate and regular rhythm. S1 and S2 normal without gallop, murmur, click, rub or other extra sounds    Lungs:Chest clear to auscultation; no wheezes, rhonchi,rales ,or rubs present.No increased work of breathing.   Abdomen: bowel sounds normal, soft and non-tender without masses, organomegaly or hernias noted.  No guarding or rebound.   Vascular : all pulses equal ; no bruits present.  Skin:Warm & dry.  Intact without suspicious lesions or rashes ; no tenting or jaundice   Lymphatic: No lymphadenopathy is noted about the head, neck, axillas.   Neuro: Strength, tone & DTRs normal.     Assessment & Plan:  #1 comprehensive physical exam; no acute findings  Plan: see Orders  & Recommendations

## 2015-01-03 ENCOUNTER — Other Ambulatory Visit (INDEPENDENT_AMBULATORY_CARE_PROVIDER_SITE_OTHER): Payer: BLUE CROSS/BLUE SHIELD

## 2015-01-03 DIAGNOSIS — Z0189 Encounter for other specified special examinations: Secondary | ICD-10-CM | POA: Diagnosis not present

## 2015-01-03 DIAGNOSIS — Z0000 Encounter for general adult medical examination without abnormal findings: Secondary | ICD-10-CM

## 2015-01-03 LAB — BASIC METABOLIC PANEL
BUN: 18 mg/dL (ref 6–23)
CALCIUM: 9.5 mg/dL (ref 8.4–10.5)
CO2: 28 meq/L (ref 19–32)
CREATININE: 1.05 mg/dL (ref 0.40–1.20)
Chloride: 106 mEq/L (ref 96–112)
GFR: 58.25 mL/min — ABNORMAL LOW (ref >60.00)
Glucose, Bld: 93 mg/dL (ref 70–99)
Potassium: 4.1 mEq/L (ref 3.5–5.1)
Sodium: 141 mEq/L (ref 135–145)

## 2015-01-03 LAB — CBC WITH DIFFERENTIAL/PLATELET
BASOS ABS: 0 10*3/uL (ref 0.0–0.1)
BASOS PCT: 0.7 % (ref 0.0–3.0)
EOS ABS: 0.1 10*3/uL (ref 0.0–0.7)
Eosinophils Relative: 2 % (ref 0.0–5.0)
HCT: 39.5 % (ref 36.0–46.0)
Hemoglobin: 13 g/dL (ref 12.0–15.0)
Lymphocytes Relative: 24.9 % (ref 12.0–46.0)
Lymphs Abs: 1.2 10*3/uL (ref 0.7–4.0)
MCHC: 33 g/dL (ref 30.0–36.0)
MCV: 95.2 fl (ref 78.0–100.0)
Monocytes Absolute: 0.5 10*3/uL (ref 0.1–1.0)
Monocytes Relative: 11.1 % (ref 3.0–12.0)
NEUTROS ABS: 3 10*3/uL (ref 1.4–7.7)
NEUTROS PCT: 61.3 % (ref 43.0–77.0)
Platelets: 260 10*3/uL (ref 150.0–400.0)
RBC: 4.15 Mil/uL (ref 3.87–5.11)
RDW: 12.9 % (ref 11.5–15.5)
WBC: 4.9 10*3/uL (ref 4.0–10.5)

## 2015-01-03 LAB — VITAMIN D 25 HYDROXY (VIT D DEFICIENCY, FRACTURES): VITD: 34.52 ng/mL (ref 30.00–100.00)

## 2015-01-03 LAB — HEPATIC FUNCTION PANEL
ALBUMIN: 4.1 g/dL (ref 3.5–5.2)
ALK PHOS: 56 U/L (ref 39–117)
ALT: 16 U/L (ref 0–35)
AST: 17 U/L (ref 0–37)
Bilirubin, Direct: 0.1 mg/dL (ref 0.0–0.3)
Total Bilirubin: 0.5 mg/dL (ref 0.2–1.2)
Total Protein: 6.7 g/dL (ref 6.0–8.3)

## 2015-01-03 LAB — LIPID PANEL
CHOL/HDL RATIO: 2
Cholesterol: 158 mg/dL (ref 0–200)
HDL: 63.3 mg/dL (ref >39.00)
LDL Cholesterol: 81 mg/dL (ref 0–99)
NONHDL: 94.54
TRIGLYCERIDES: 66 mg/dL (ref 0.0–149.0)
VLDL: 13.2 mg/dL (ref 0.0–40.0)

## 2015-01-03 LAB — TSH: TSH: 1.31 u[IU]/mL (ref 0.35–4.50)

## 2015-01-06 ENCOUNTER — Other Ambulatory Visit: Payer: Self-pay

## 2015-01-06 DIAGNOSIS — Z1231 Encounter for screening mammogram for malignant neoplasm of breast: Secondary | ICD-10-CM

## 2015-02-10 ENCOUNTER — Ambulatory Visit
Admission: RE | Admit: 2015-02-10 | Discharge: 2015-02-10 | Disposition: A | Discharge status: Home / Self Care | Payer: BLUE CROSS/BLUE SHIELD | Source: Ambulatory Visit

## 2015-02-10 DIAGNOSIS — Z1231 Encounter for screening mammogram for malignant neoplasm of breast: Secondary | ICD-10-CM

## 2015-03-16 DIAGNOSIS — M48061 Spinal stenosis, lumbar region without neurogenic claudication: Secondary | ICD-10-CM

## 2015-03-16 HISTORY — DX: Spinal stenosis, lumbar region without neurogenic claudication: M48.061

## 2015-03-24 ENCOUNTER — Ambulatory Visit (INDEPENDENT_AMBULATORY_CARE_PROVIDER_SITE_OTHER): Payer: BLUE CROSS/BLUE SHIELD | Admitting: Nurse Practitioner

## 2015-03-24 ENCOUNTER — Encounter: Payer: Self-pay | Admitting: Nurse Practitioner

## 2015-03-24 VITALS — BP 118/80 | HR 80 | Ht 67.25 in | Wt 160.0 lb

## 2015-03-24 DIAGNOSIS — Z0000 Encounter for general adult medical examination without abnormal findings: Secondary | ICD-10-CM

## 2015-03-24 DIAGNOSIS — Z01419 Encounter for gynecological examination (general) (routine) without abnormal findings: Secondary | ICD-10-CM

## 2015-03-24 NOTE — Patient Instructions (Addendum)

## 2015-03-24 NOTE — Progress Notes (Signed)
Patient ID: Michele Bennett, female   DOB: August 21, 1960, 54 y.o.   MRN: [id number]  55 y.o. G0P0 Single  Caucasian Fe here for annual exam.  Menses slight irregular but still heavy and last 4-5 days.  Heavy for  2 days using super tampon every 2 hours. Some cramps. Some hot flashes that are tolerable -worse at night.  Not dating or SA.  Taking care of father during the week along with her mother.  They live in [place].  Father has Parkinson disease.  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  Patient's last menstrual period was 03/17/2015 (exact date).          Sexually active: No.  The current method of family planning is none and abstinence.    Exercising: Yes.    walking Smoker:  no  Health Maintenance: Pap: 03/18/14, Negative with neg HR HPV MMG: 02/10/15, Bi-Rads 1: Negative Colonoscopy:  05/25/13, Negative, repeat in 7 years TDaP: 2007 Shingles: Not indicated due to age Pneumonia: Not indicated due to age Hep C and HIV: done 05/31/2012 negative Labs: PCP in 12/2014 (in [facility])   reports that she has never smoked. She has never used smokeless tobacco. She reports that she drinks about 1.2 - 1.8 oz of alcohol per week. She reports that she does not use illicit drugs.  Past Medical History  Diagnosis Date  . Cyst     hip; probable epidemoid inclusion cyst; resolution with antibiotics  . Allergy   . Vitamin D deficiency   . Tear of left acetabular labrum 2006 ?    Dr Sallyanne Havers  . Right knee meniscal tear  07/2011    Dr Hal Morales, Ortho; S/P cortisone injection    Past Surgical History  Procedure Laterality Date  . Ankle surgery  1989     Post fracture   . Laparotomy  1988    For Dysmenorrhea  . Hand surgery      Thumb surgery x 2  . Foot surgery      left- bone spurs, Dr Mayer Camel  . Dilation and curettage of uterus  1988  . Knee arthroscopy Right 2013    Current Outpatient Prescriptions  Medication Sig Dispense Refill  . Cholecalciferol (VITAMIN D3)  2000 UNITS TABS Take by mouth daily.      . diclofenac sodium (VOLTAREN) 1 % GEL Apply 4 g topically 4 (four) times daily. 100 g 3  . fexofenadine (ALLEGRA) 180 MG tablet Take 180 mg by mouth as needed.     . meloxicam (MOBIC) 7.5 MG tablet Take 7.5 mg by mouth daily.    . Misc Natural Products (TART CHERRY ADVANCED PO) Take by mouth.    . TURMERIC PO Take by mouth daily.     No current facility-administered medications for this visit.    Family History  Problem Relation Age of Onset  . Parkinsonism Father   . Colon polyps Father     No colon cancer  . Transient ischemic attack Father   . Breast cancer Mother 69  . Coronary artery disease Mother     ? radiation related  . Arthritis Mother     DJD  . Arthritis Maternal Grandmother     DJD  . Diabetes Maternal Grandfather   . Heart attack Neg Hx     ROS:  Pertinent items are noted in HPI.  Otherwise, a comprehensive ROS was negative.  Exam:   BP 118/80 mmHg  Pulse 80  Ht 5' 7.25" (  1.708 m)  Wt 160 lb (72.576 kg)  BMI 24.88 kg/m2  LMP 03/17/2015 (Exact Date) Height: 5' 7.25" (170.8 cm) Ht Readings from Last 3 Encounters:  03/24/15 5' 7.25" (1.708 m)  01/02/15 5\' 7"  (1.702 m)  03/18/14 5' 7.75" (1.721 m)    General appearance: alert, cooperative and appears stated age Head: Normocephalic, without obvious abnormality, atraumatic Neck: no adenopathy, supple, symmetrical, trachea midline and thyroid normal to inspection and palpation Lungs: clear to auscultation bilaterally Breasts: normal appearance, no masses or tenderness Heart: regular rate and rhythm Abdomen: soft, non-tender; no masses,  no organomegaly Extremities: extremities normal, atraumatic, no cyanosis or edema Skin: Skin color, texture, turgor normal. No rashes or lesions Lymph nodes: Cervical, supraclavicular, and axillary nodes normal. No abnormal inguinal nodes palpated Neurologic: Grossly normal   Pelvic: External genitalia:  no lesions               Urethra:  normal appearing urethra with no masses, tenderness or lesions              Bartholin's and Skene's: normal                 Vagina: normal appearing vagina with normal color and discharge, no lesions              Cervix: anteverted              Pap taken: No. Bimanual Exam:  Uterus:  normal size, contour, position, consistency, mobility, non-tender              Adnexa: no mass, fullness, tenderness               Rectovaginal: Confirms               Anus:  normal sphincter tone, no lesions  Chaperone present: no  A:  Well Woman with normal exam  Perimenopause consistent with irregular menses - will keep a menstrual record and if no menses > 90 days to call back. Condoms for birth control if SA  Family stressors with fathers illness   P:   Reviewed health and wellness pertinent to exam  Pap smear as above  Mammogram is due 01/2016  Counseled on breast self exam, mammography screening, adequate intake of calcium and vitamin D, diet and exercise return annually or prn  An After Visit Summary was printed and given to the patient.

## 2015-03-25 NOTE — Progress Notes (Signed)
Encounter reviewed Deontrae Drinkard, MD   

## 2015-12-11 ENCOUNTER — Telehealth: Payer: Self-pay | Admitting: Emergency Medicine

## 2015-12-11 NOTE — Telephone Encounter (Signed)
Pt called and was a patient of Dr Darrol Angel. She is wondering if you would be willing to except for as a transfer patient? Please advise thanks.

## 2015-12-11 NOTE — Telephone Encounter (Signed)
Yes, I will accept her 

## 2015-12-12 NOTE — Telephone Encounter (Signed)
I called patient and left VM letting her know Dr Quay Burow is willing to except her as a transfer patient. Told her to call back if she wants to make an appt.

## 2015-12-16 ENCOUNTER — Ambulatory Visit (INDEPENDENT_AMBULATORY_CARE_PROVIDER_SITE_OTHER): Payer: BLUE CROSS/BLUE SHIELD

## 2015-12-16 DIAGNOSIS — Z23 Encounter for immunization: Secondary | ICD-10-CM | POA: Diagnosis not present

## 2015-12-16 NOTE — Addendum Note (Signed)
Addended by: Delice Bison E on: 12/16/2015 08:39 AM   Modules accepted: Orders

## 2015-12-25 ENCOUNTER — Ambulatory Visit (INDEPENDENT_AMBULATORY_CARE_PROVIDER_SITE_OTHER): Payer: BLUE CROSS/BLUE SHIELD | Admitting: Specialist

## 2015-12-25 DIAGNOSIS — M4316 Spondylolisthesis, lumbar region: Secondary | ICD-10-CM | POA: Diagnosis not present

## 2015-12-25 DIAGNOSIS — M5136 Other intervertebral disc degeneration, lumbar region: Secondary | ICD-10-CM

## 2016-02-02 ENCOUNTER — Other Ambulatory Visit: Payer: Self-pay | Admitting: Nurse Practitioner

## 2016-02-02 DIAGNOSIS — Z1231 Encounter for screening mammogram for malignant neoplasm of breast: Secondary | ICD-10-CM

## 2016-02-12 ENCOUNTER — Encounter (INDEPENDENT_AMBULATORY_CARE_PROVIDER_SITE_OTHER): Payer: Self-pay | Admitting: Specialist

## 2016-02-12 ENCOUNTER — Ambulatory Visit (INDEPENDENT_AMBULATORY_CARE_PROVIDER_SITE_OTHER): Payer: BLUE CROSS/BLUE SHIELD | Admitting: Specialist

## 2016-02-12 VITALS — BP 124/74 | HR 59 | Ht 67.0 in | Wt 160.0 lb

## 2016-02-12 DIAGNOSIS — M48062 Spinal stenosis, lumbar region with neurogenic claudication: Secondary | ICD-10-CM

## 2016-02-12 DIAGNOSIS — M5136 Other intervertebral disc degeneration, lumbar region: Secondary | ICD-10-CM | POA: Diagnosis not present

## 2016-02-12 NOTE — Progress Notes (Signed)
Office Visit Note   Patient: Michele Bennett           Date of Birth: 18-May-1960           MRN: [id number] Visit Date: 02/12/2016              Requested by: Binnie Rail, MD [place], [place] [address] PCP: Binnie Rail, MD   Assessment & Plan: Visit Diagnoses:  1. Spinal stenosis of lumbar region with neurogenic claudication   2. Degenerative disc disease, lumbar     Plan: Avoid bending, stooping and avoid lifting weights greater than 10 lbs. Avoid prolong standing and walking. Avoid frequent bending and stooping  No lifting greater than 10 lbs. May use ice or moist heat for pain. Weight loss is of benefit.  Dr. Romona Curls secretary/Assistant will call to arrange for epidural steroid injection  Follow-Up Instructions: Return in about 4 weeks (around 03/11/2016).   Orders:  Orders Placed This Encounter  Procedures  . Ambulatory referral to Physical Medicine Rehab   No orders of the defined types were placed in this encounter.     Procedures: No procedures performed   Clinical Data: Findings:  MRI from 03/2014, shows left left lateral recess narrowing due to disc broad based protusion and overlying facet hypertrophy at the left L3-4 and left L4-5 primarly secondary to facet hypertrophy.    Subjective: Chief Complaint  Patient presents with  . Lower Back - Pain, Follow-up    Patient is returning for 6 week recheck on low back pain. She states that the back is some better but still having some left leg numbness. Took the diclofenac for the 10 days but felt like the meloxicam worked better. Pain in the the left back and radiation into the left lateral thigh to the left knee. It is associated with numbness, worsened by bending and stooping and lifting. 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    Review of Systems  HENT: Negative.   Eyes: Positive for visual disturbance.  Respiratory:  Negative.   Cardiovascular: Negative.   Gastrointestinal: Negative.   Genitourinary: Negative.   Musculoskeletal: Positive for back pain and gait problem.  Skin: Negative.   Allergic/Immunologic: Positive for environmental allergies.  Neurological: Positive for numbness. Negative for weakness.  Hematological: Negative.   Psychiatric/Behavioral: Negative.      Objective: Vital Signs: BP 124/74   Pulse (!) 59   Ht 5\' 7"  (1.702 m)   Wt 160 lb (72.6 kg)   BMI 25.06 kg/m   Physical Exam  Constitutional: She is oriented to person, place, and time. She appears well-developed and well-nourished.  HENT:  Head: Normocephalic and atraumatic.  Eyes: EOM are normal. Pupils are equal, round, and reactive to light.  Neck: Normal range of motion. Neck supple.  Pulmonary/Chest: Effort normal and breath sounds normal.  Abdominal: Soft. Bowel sounds are normal.  Musculoskeletal: Normal range of motion.  Neurological: She is alert and oriented to person, place, and time.  Skin: Skin is warm and dry.  Psychiatric: She has a normal mood and affect. Her behavior is normal. Judgment and thought content normal.    Back Exam   Tenderness  The patient is experiencing tenderness in the lumbar.  Range of Motion  Extension: normal  Flexion: normal  Lateral Bend Right: normal  Lateral Bend Left: normal  Rotation Right: normal  Rotation Left: normal   Muscle  Strength  Right Quadriceps:  5/5  Left Quadriceps:  5/5  Right Hamstrings:  5/5  Left Hamstrings:  5/5   Tests  Straight leg raise right: negative Straight leg raise left: negative  Reflexes  Patellar: normal Achilles: normal Babinski's sign: normal   Other  Toe Walk: normal Heel Walk: normal Sensation: normal Gait: normal  Erythema: no back redness Scars: absent      Specialty Comments:  No specialty comments available.  Imaging: No results found.   PMFS History: Patient Active Problem List   Diagnosis Date  Noted  . DDD (degenerative disc disease), cervical 10/19/2012  . PVCs (premature ventricular contractions) 03/12/2011  . VITAMIN D DEFICIENCY 01/01/2008  . DYSMENORRHEA 01/01/2008  . DJD (degenerative joint disease), multiple sites 01/01/2008   Past Medical History:  Diagnosis Date  . Allergy   . Cyst    hip; probable epidemoid inclusion cyst; resolution with antibiotics  . Right knee meniscal tear  07/2011   Dr Hal Morales, Ortho; S/P cortisone injection  . Tear of left acetabular labrum 2006 ?   Dr Sallyanne Havers  . Vitamin D deficiency     Family History  Problem Relation Age of Onset  . Parkinsonism Father   . Colon polyps Father     No colon cancer  . Transient ischemic attack Father   . Breast cancer Mother 75  . Coronary artery disease Mother     ? radiation related  . Arthritis Mother     DJD  . Arthritis Maternal Grandmother     DJD  . Diabetes Maternal Grandfather   . Heart attack Neg Hx     Past Surgical History:  Procedure Laterality Date  . ANKLE SURGERY  1989    Post fracture   . DILATION AND CURETTAGE OF UTERUS  1988  . FOOT SURGERY     left- bone spurs, Dr Mayer Camel  . HAND SURGERY     Thumb surgery x 2  . KNEE ARTHROSCOPY Right 2013  . LAPAROTOMY  1988   For Dysmenorrhea   Social History   Occupational History  . Not on file.   Social History Main Topics  . Smoking status: Never Smoker  . Smokeless tobacco: Never Used  . Alcohol use 1.2 - 1.8 oz/week    2 - 3 Glasses of wine per week  . Drug use: No  . Sexual activity: Not Currently    Partners: Male

## 2016-02-12 NOTE — Patient Instructions (Signed)
Avoid bending, stooping and avoid lifting weights greater than 10 lbs. Avoid prolong standing and walking. Avoid frequent bending and stooping  No lifting greater than 10 lbs. May use ice or moist heat for pain. Weight loss is of benefit.  Dr. Romona Curls secretary/Assistant will call to arrange for epidural steroid injection

## 2016-02-15 IMAGING — CR DG LUMBAR SPINE COMPLETE 4+V
5 series · 5 of 5 positions shown · non-contrast
Comparison: None.

CLINICAL DATA: Low back pain. Left leg numbness x2 months. No known
injury.

EXAM:
LUMBAR SPINE - COMPLETE 4+ VIEW

[view not recorded (1 of 5)]
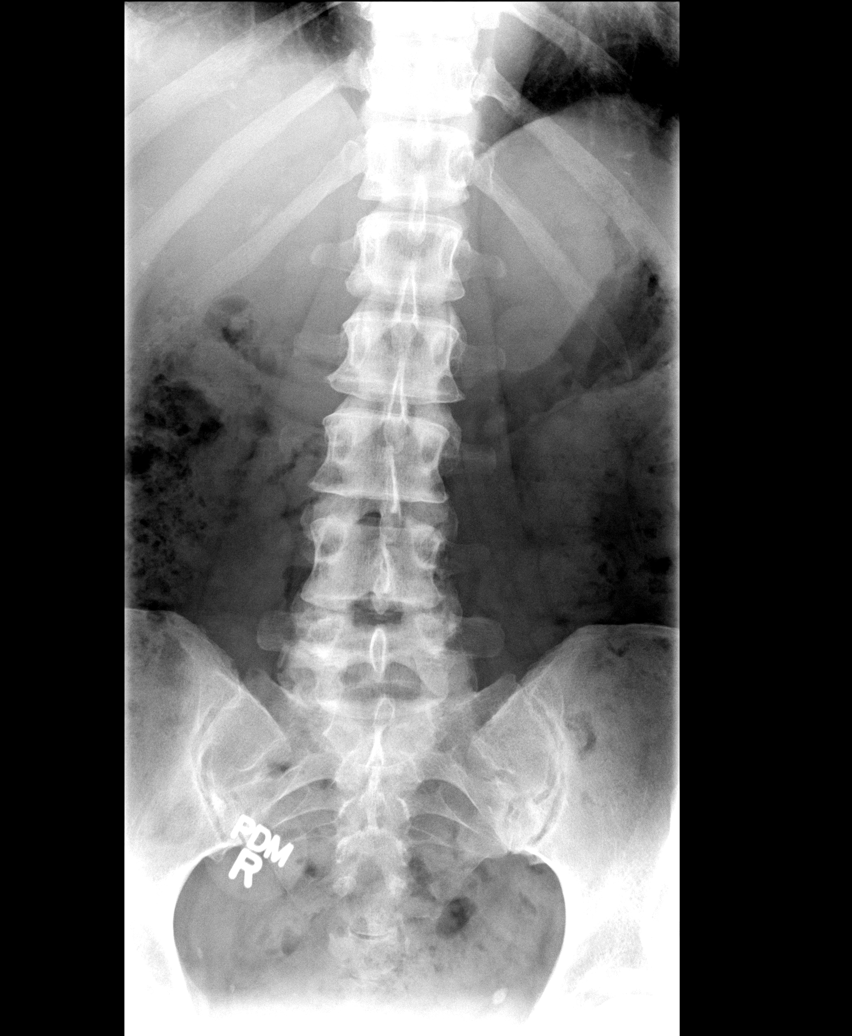

[view not recorded (2 of 5)]
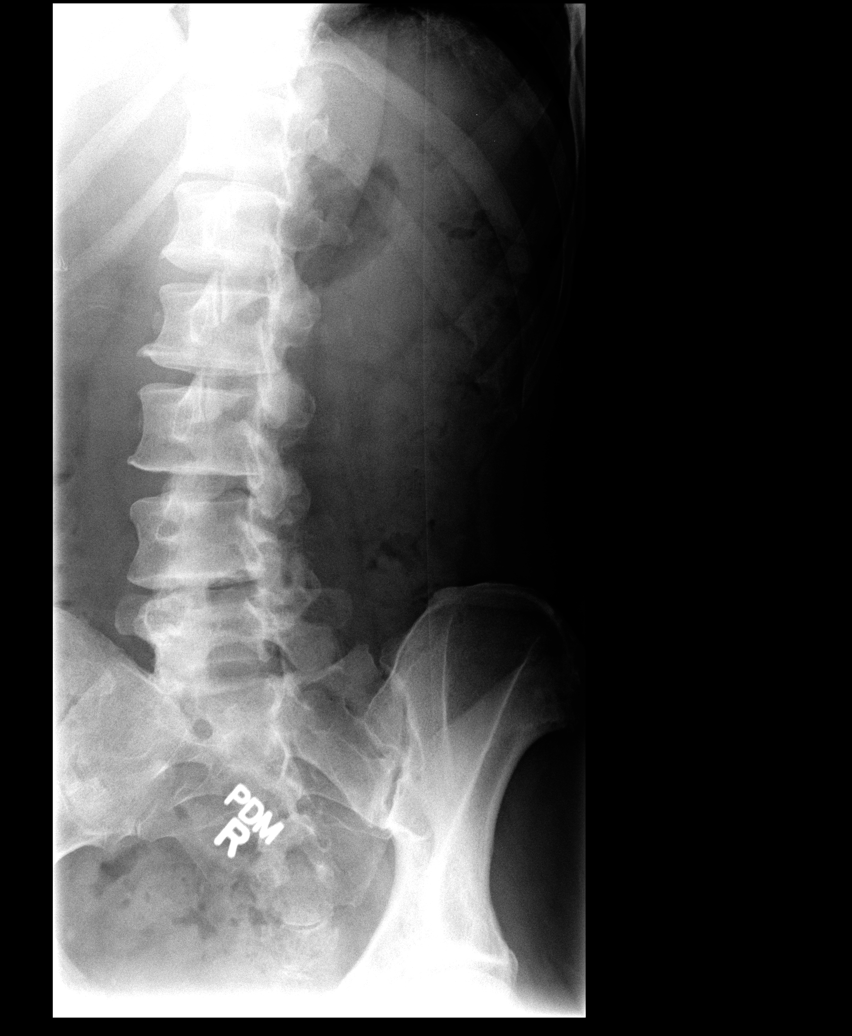

[view not recorded (3 of 5)]
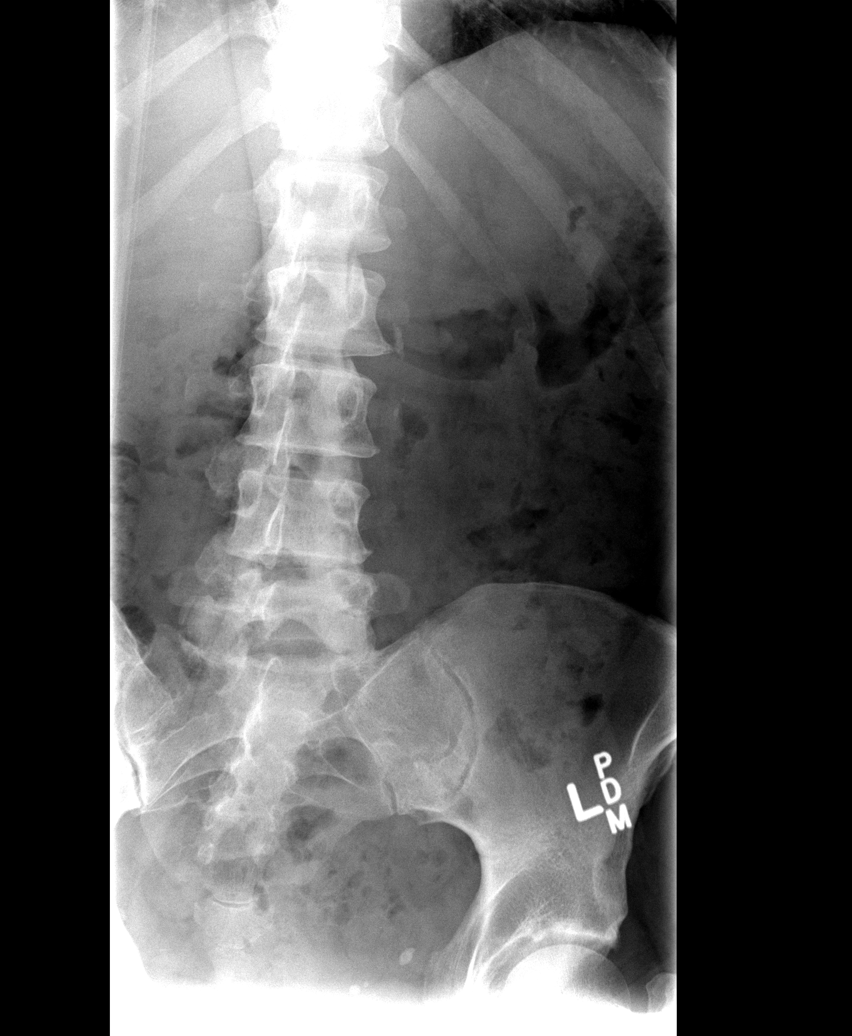

[view not recorded (4 of 5)]
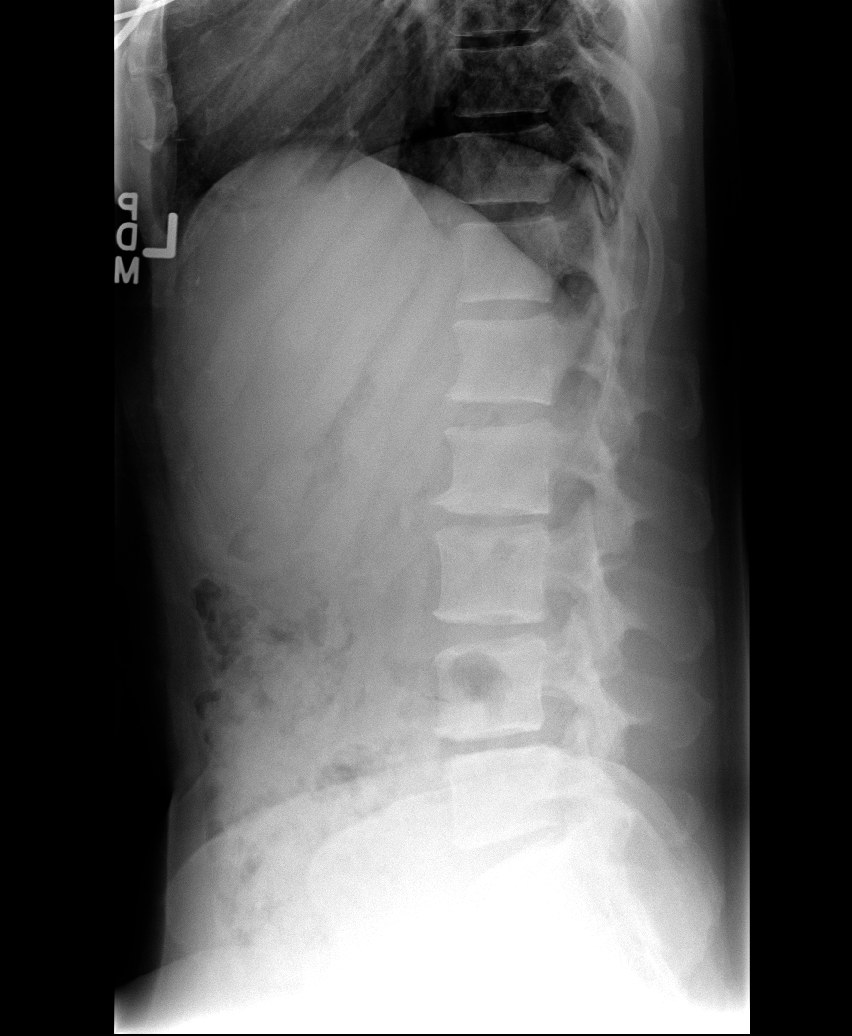

[view not recorded (5 of 5)]
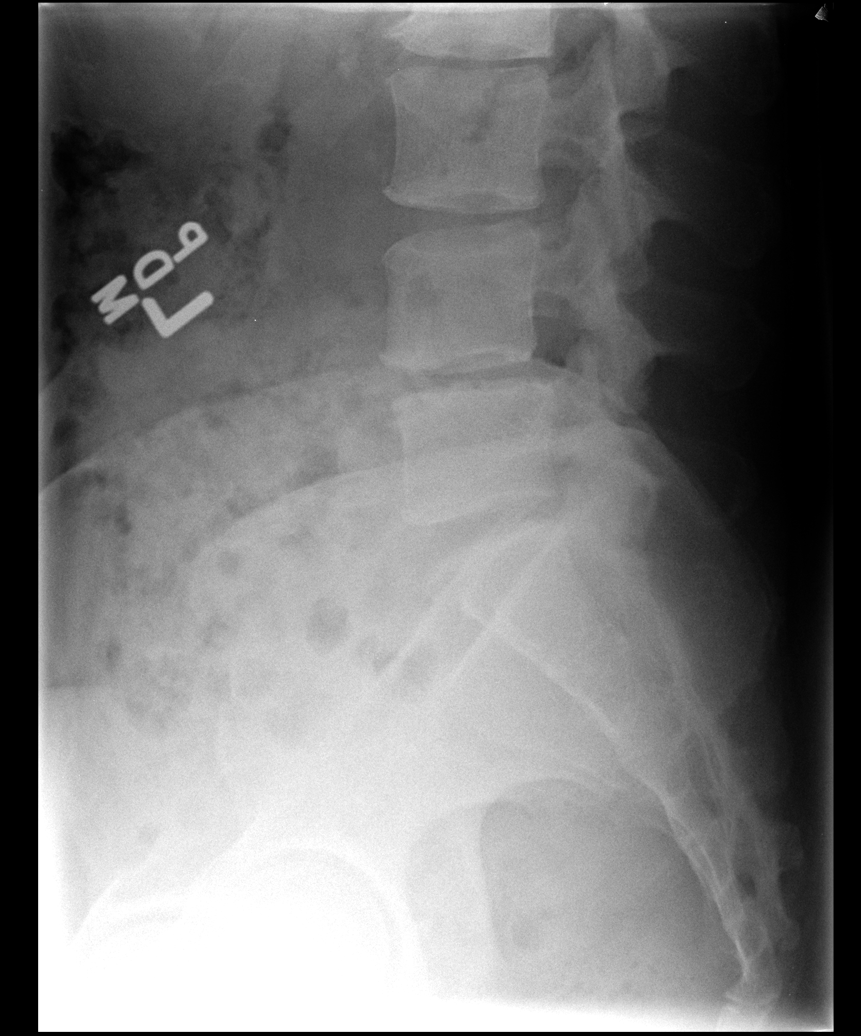

[5 of 5 positions shown; findings below may reference images not displayed]

FINDINGS: Mild diffuse degenerative change. Normal mineralization. Minimal
anterolisthesis L4 on L5 of approximately 4 mm. This is most likely
degenerative. No acute bony abnormality identified. Calcifications
pelvis consistent phleboliths. Aortic atherosclerotic vascular
disease.
IMPRESSION: 1. Mild diffuse degenerative changes lumbar spine.
2. Mild anterolisthesis L4 on L5 of 4 mm. This is most likely
degenerative.

## 2016-02-20 ENCOUNTER — Ambulatory Visit (INDEPENDENT_AMBULATORY_CARE_PROVIDER_SITE_OTHER): Payer: BLUE CROSS/BLUE SHIELD | Admitting: Internal Medicine

## 2016-02-20 ENCOUNTER — Encounter: Payer: Self-pay | Admitting: Internal Medicine

## 2016-02-20 VITALS — BP 124/66 | HR 59 | Temp 98.0°F | Resp 16 | Ht 67.0 in | Wt 162.0 lb

## 2016-02-20 DIAGNOSIS — E559 Vitamin D deficiency, unspecified: Secondary | ICD-10-CM

## 2016-02-20 DIAGNOSIS — N946 Dysmenorrhea, unspecified: Secondary | ICD-10-CM | POA: Diagnosis not present

## 2016-02-20 DIAGNOSIS — M48061 Spinal stenosis, lumbar region without neurogenic claudication: Secondary | ICD-10-CM | POA: Diagnosis not present

## 2016-02-20 DIAGNOSIS — Z0000 Encounter for general adult medical examination without abnormal findings: Secondary | ICD-10-CM

## 2016-02-20 DIAGNOSIS — I493 Ventricular premature depolarization: Secondary | ICD-10-CM

## 2016-02-20 NOTE — Patient Instructions (Signed)
Test(s) ordered today. Your results will be released to [place] (or called to you) after review, usually within 72hours after test completion. If any changes need to be made, you will be notified at that same time.  All other Health Maintenance issues reviewed.   All recommended immunizations and age-appropriate screenings are up-to-date or discussed.  No immunizations administered today.   Medications reviewed and updated.  No changes recommended at this time.   Please followup in one year   Health Maintenance, Female Introduction Adopting a healthy lifestyle and getting preventive care can go a long way to promote health and wellness. Talk with your health care provider about what schedule of regular examinations is right for you. This is a good chance for you to check in with your provider about disease prevention and staying healthy. In between checkups, there are plenty of things you can do on your own. Experts have done a lot of research about which lifestyle changes and preventive measures are most likely to keep you healthy. Ask your health care provider for more information. Weight and diet Eat a healthy diet  Be sure to include plenty of vegetables, fruits, low-fat dairy products, and lean protein.  Do not eat a lot of foods high in solid fats, added sugars, or salt.  Get regular exercise. This is one of the most important things you can do for your health.  Most adults should exercise for at least 150 minutes each week. The exercise should increase your heart rate and make you sweat (moderate-intensity exercise).  Most adults should also do strengthening exercises at least twice a week. This is in addition to the moderate-intensity exercise. Maintain a healthy weight  Body mass index (BMI) is a measurement that can be used to identify possible weight problems. It estimates body fat based on height and weight. Your health care provider can help determine your BMI and help  you achieve or maintain a healthy weight.  For females 81 years of age and older:  A BMI below 18.5 is considered underweight.  A BMI of 18.5 to 24.9 is normal.  A BMI of 25 to 29.9 is considered overweight.  A BMI of 30 and above is considered obese. Watch levels of cholesterol and blood lipids  You should start having your blood tested for lipids and cholesterol at 54 years of age, then have this test every 5 years.  You may need to have your cholesterol levels checked more often if:  Your lipid or cholesterol levels are high.  You are older than 54 years of age.  You are at high risk for heart disease. Cancer screening Lung Cancer  Lung cancer screening is recommended for adults 19-31 years old who are at high risk for lung cancer because of a history of smoking.  A yearly low-dose CT scan of the lungs is recommended for people who:  Currently smoke.  Have quit within the past 15 years.  Have at least a 30-pack-year history of smoking. A pack year is smoking an average of one pack of cigarettes a day for 1 year.  Yearly screening should continue until it has been 15 years since you quit.  Yearly screening should stop if you develop a health problem that would prevent you from having lung cancer treatment. Breast Cancer  Practice breast self-awareness. This means understanding how your breasts normally appear and feel.  It also means doing regular breast self-exams. Let your health care provider know about any changes, no matter how  small.  If you are in your 20s or 30s, you should have a clinical breast exam (CBE) by a health care provider every 1-3 years as part of a regular health exam.  If you are 40 or older, have a CBE every year. Also consider having a breast X-ray (mammogram) every year.  If you have a family history of breast cancer, talk to your health care provider about genetic screening.  If you are at high risk for breast cancer, talk to your health  care provider about having an MRI and a mammogram every year.  Breast cancer gene (BRCA) assessment is recommended for women who have family members with BRCA-related cancers. BRCA-related cancers include:  Breast.  Ovarian.  Tubal.  Peritoneal cancers.  Results of the assessment will determine the need for genetic counseling and BRCA1 and BRCA2 testing. Cervical Cancer  Your health care provider may recommend that you be screened regularly for cancer of the pelvic organs (ovaries, uterus, and vagina). This screening involves a pelvic examination, including checking for microscopic changes to the surface of your cervix (Pap test). You may be encouraged to have this screening done every 3 years, beginning at age 67.  For women ages 14-65, health care providers may recommend pelvic exams and Pap testing every 3 years, or they may recommend the Pap and pelvic exam, combined with testing for human papilloma virus (HPV), every 5 years. Some types of HPV increase your risk of cervical cancer. Testing for HPV may also be done on women of any age with unclear Pap test results.  Other health care providers may not recommend any screening for nonpregnant women who are considered low risk for pelvic cancer and who do not have symptoms. Ask your health care provider if a screening pelvic exam is right for you.  If you have had past treatment for cervical cancer or a condition that could lead to cancer, you need Pap tests and screening for cancer for at least 20 years after your treatment. If Pap tests have been discontinued, your risk factors (such as having a new sexual partner) need to be reassessed to determine if screening should resume. Some women have medical problems that increase the chance of getting cervical cancer. In these cases, your health care provider may recommend more frequent screening and Pap tests. Colorectal Cancer  This type of cancer can be detected and often prevented.  Routine  colorectal cancer screening usually begins at 54 years of age and continues through 54 years of age.  Your health care provider may recommend screening at an earlier age if you have risk factors for colon cancer.  Your health care provider may also recommend using home test kits to check for hidden blood in the stool.  A small camera at the end of a tube can be used to examine your colon directly (sigmoidoscopy or colonoscopy). This is done to check for the earliest forms of colorectal cancer.  Routine screening usually begins at age 65.  Direct examination of the colon should be repeated every 5-10 years through 54 years of age. However, you may need to be screened more often if early forms of precancerous polyps or small growths are found. Skin Cancer  Check your skin from head to toe regularly.  Tell your health care provider about any new moles or changes in moles, especially if there is a change in a mole's shape or color.  Also tell your health care provider if you have a mole that is  larger than the size of a pencil eraser.  Always use sunscreen. Apply sunscreen liberally and repeatedly throughout the day.  Protect yourself by wearing long sleeves, pants, a wide-brimmed hat, and sunglasses whenever you are outside. Heart disease, diabetes, and high blood pressure  High blood pressure causes heart disease and increases the risk of stroke. High blood pressure is more likely to develop in:  People who have blood pressure in the high end of the normal range (130-139/85-89 mm Hg).  People who are overweight or obese.  People who are African American.  If you are 18-39 years of age, have your blood pressure checked every 3-5 years. If you are 40 years of age or older, have your blood pressure checked every year. You should have your blood pressure measured twice-once when you are at a hospital or clinic, and once when you are not at a hospital or clinic. Record the average of the two  measurements. To check your blood pressure when you are not at a hospital or clinic, you can use:  An automated blood pressure machine at a pharmacy.  A home blood pressure monitor.  If you are between 55 years and 79 years old, ask your health care provider if you should take aspirin to prevent strokes.  Have regular diabetes screenings. This involves taking a blood sample to check your fasting blood sugar level.  If you are at a normal weight and have a low risk for diabetes, have this test once every three years after 54 years of age.  If you are overweight and have a high risk for diabetes, consider being tested at a younger age or more often. Preventing infection Hepatitis B  If you have a higher risk for hepatitis B, you should be screened for this virus. You are considered at high risk for hepatitis B if:  You were born in a country where hepatitis B is common. Ask your health care provider which countries are considered high risk.  Your parents were born in a high-risk country, and you have not been immunized against hepatitis B (hepatitis B vaccine).  You have HIV or AIDS.  You use needles to inject street drugs.  You live with someone who has hepatitis B.  You have had sex with someone who has hepatitis B.  You get hemodialysis treatment.  You take certain medicines for conditions, including cancer, organ transplantation, and autoimmune conditions. Hepatitis C  Blood testing is recommended for:  Everyone born from 1945 through 1965.  Anyone with known risk factors for hepatitis C. Sexually transmitted infections (STIs)  You should be screened for sexually transmitted infections (STIs) including gonorrhea and chlamydia if:  You are sexually active and are younger than 54 years of age.  You are older than 54 years of age and your health care provider tells you that you are at risk for this type of infection.  Your sexual activity has changed since you were last  screened and you are at an increased risk for chlamydia or gonorrhea. Ask your health care provider if you are at risk.  If you do not have HIV, but are at risk, it may be recommended that you take a prescription medicine daily to prevent HIV infection. This is called pre-exposure prophylaxis (PrEP). You are considered at risk if:  You are sexually active and do not regularly use condoms or know the HIV status of your partner(s).  You take drugs by injection.  You are sexually active with a partner   who has HIV. Talk with your health care provider about whether you are at high risk of being infected with HIV. If you choose to begin PrEP, you should first be tested for HIV. You should then be tested every 3 months for as long as you are taking PrEP. Pregnancy  If you are premenopausal and you may become pregnant, ask your health care provider about preconception counseling.  If you may become pregnant, take 400 to 800 micrograms (mcg) of folic acid every day.  If you want to prevent pregnancy, talk to your health care provider about birth control (contraception). Osteoporosis and menopause  Osteoporosis is a disease in which the bones lose minerals and strength with aging. This can result in serious bone fractures. Your risk for osteoporosis can be identified using a bone density scan.  If you are 64 years of age or older, or if you are at risk for osteoporosis and fractures, ask your health care provider if you should be screened.  Ask your health care provider whether you should take a calcium or vitamin D supplement to lower your risk for osteoporosis.  Menopause may have certain physical symptoms and risks.  Hormone replacement therapy may reduce some of these symptoms and risks. Talk to your health care provider about whether hormone replacement therapy is right for you. Follow these instructions at home:  Schedule regular health, dental, and eye exams.  Stay current with your  immunizations.  Do not use any tobacco products including cigarettes, chewing tobacco, or electronic cigarettes.  If you are pregnant, do not drink alcohol.  If you are breastfeeding, limit how much and how often you drink alcohol.  Limit alcohol intake to no more than 1 drink per day for nonpregnant women. One drink equals 12 ounces of beer, 5 ounces of wine, or 1 ounces of hard liquor.  Do not use street drugs.  Do not share needles.  Ask your health care provider for help if you need support or information about quitting drugs.  Tell your health care provider if you often feel depressed.  Tell your health care provider if you have ever been abused or do not feel safe at home. This information is not intended to replace advice given to you by your health care provider. Make sure you discuss any questions you have with your health care provider. Document Released: 09/14/2010 Document Revised: 08/07/2015 Document Reviewed: 12/03/2014  2017 [employer]vier

## 2016-02-20 NOTE — Assessment & Plan Note (Signed)
Heavy menses  Check cbc, iron/ferritin

## 2016-02-20 NOTE — Progress Notes (Signed)
0   Subjective:    Patient ID: Michele Bennett, female    DOB: Apr 09, 1960, 55 y.o.   MRN: [id number]  HPI She is here for a physical exam.   She has always been cold.  She feels colder than usual.   She has no complaints.  Her father died since she was here last, but denies other changes in family history.  Medications and allergies reviewed with patient and updated if appropriate.  Patient Active Problem List   Diagnosis Date Noted  . Spinal stenosis of lumbar region 02/20/2016  . DDD (degenerative disc disease), cervical 10/19/2012  . PVCs (premature ventricular contractions) 03/12/2011  . Vitamin D deficiency 01/01/2008  . DYSMENORRHEA 01/01/2008  . DJD (degenerative joint disease), multiple sites 01/01/2008    Current Outpatient Prescriptions on File Prior to Visit  Medication Sig Dispense Refill  . Cholecalciferol (VITAMIN D3) 2000 UNITS TABS Take by mouth daily.      . diclofenac sodium (VOLTAREN) 1 % GEL Apply 4 g topically 4 (four) times daily. 100 g 3  . fexofenadine (ALLEGRA) 180 MG tablet Take 180 mg by mouth as needed.     . meloxicam (MOBIC) 7.5 MG tablet Take 7.5 mg by mouth daily.    . Misc Natural Products (TART CHERRY ADVANCED PO) Take by mouth.    . TURMERIC PO Take by mouth daily.     No current facility-administered medications on file prior to visit.     Past Medical History:  Diagnosis Date  . Allergy   . Cyst    hip; probable epidemoid inclusion cyst; resolution with antibiotics  . Right knee meniscal tear  07/2011   Dr Hal Morales, Ortho; S/P cortisone injection  . Tear of left acetabular labrum 2006 ?   Dr Sallyanne Havers  . Vitamin D deficiency     Past Surgical History:  Procedure Laterality Date  . ANKLE SURGERY  1989    Post fracture   . DILATION AND CURETTAGE OF UTERUS  1988  . FOOT SURGERY     left- bone spurs, Dr Mayer Camel  . HAND SURGERY     Thumb surgery x 2  . KNEE ARTHROSCOPY Right 2013  . LAPAROTOMY  1988   For Dysmenorrhea     Social History   Social History  . Marital status: Single    Spouse name: N/A  . Number of children: 0  . Years of education: N/A   Social History Main Topics  . Smoking status: Never Smoker  . Smokeless tobacco: Never Used  . Alcohol use 1.2 - 1.8 oz/week    2 - 3 Glasses of wine per week  . Drug use: No  . Sexual activity: Not Currently    Partners: Male   Other Topics Concern  . None   Social History Narrative   Walks dog, gardening, PT for back    Family History  Problem Relation Age of Onset  . Parkinsonism Father   . Colon polyps Father     No colon cancer  . Transient ischemic attack Father   . Breast cancer Mother 50  . Coronary artery disease Mother     ? radiation related  . Arthritis Mother     DJD  . Arthritis Maternal Grandmother     DJD  . Diabetes Maternal Grandfather   . Heart attack Neg Hx     Review of Systems  Constitutional: Negative for appetite change, chills, fatigue, fever and unexpected weight change.  No hot flashes  Eyes: Negative for visual disturbance.  Respiratory: Negative for cough, shortness of breath and wheezing.   Cardiovascular: Negative for chest pain, palpitations and leg swelling.  Gastrointestinal: Negative for abdominal pain, blood in stool, constipation, diarrhea and nausea.       No gerd  Endocrine: Positive for cold intolerance.  Genitourinary: Negative for difficulty urinating (no incontince), dysuria and hematuria.  Musculoskeletal: Positive for arthralgias and back pain.  Skin: Negative for color change and rash.  Neurological: Positive for numbness. Negative for dizziness, light-headedness and headaches.  Psychiatric/Behavioral: Negative for dysphoric mood. The patient is not nervous/anxious.        Objective:   Vitals:   02/20/16 1000  BP: 124/66  Pulse: (!) 59  Resp: 16  Temp: 98 F (36.7 C)   Filed Weights   02/20/16 1000  Weight: 162 lb (73.5 kg)   Body mass index is 25.37  kg/m.   Physical Exam Constitutional: She appears well-developed and well-nourished. No distress.  HENT:  Head: Normocephalic and atraumatic.  Right Ear: External ear normal. Normal ear canal and TM Left Ear: External ear normal.  Normal ear canal and TM Mouth/Throat: Oropharynx is clear and moist.  Eyes: Conjunctivae and EOM are normal.  Neck: Neck supple. No tracheal deviation present. No thyromegaly present.  No carotid bruit  Cardiovascular: Normal rate, regular rhythm and normal heart sounds.   No murmur heard.  No edema. Pulmonary/Chest: Effort normal and breath sounds normal. No respiratory distress. She has no wheezes. She has no rales.  Breast: deferred to Gyn Abdominal: Soft. She exhibits no distension. There is no tenderness.  Lymphadenopathy: She has no cervical adenopathy.  Skin: Skin is warm and dry. She is not diaphoretic.  Psychiatric: She has a normal mood and affect. Her behavior is normal.         Assessment & Plan:   Physical exam: Screening blood work  ordered Immunizations  Up to date  Colonoscopy  Up to date  Mammogram  - schedule for monday Gyn   Up to date  Eye exams  Up to date  EKG - done in 2014 - normal Exercise - some exercise, but active - stressed regular exercise Weight  Weight is good Skin  - no concerns Substance abuse - none  See Problem List for Assessment and Plan of chronic medical problems. F/u annually

## 2016-02-20 NOTE — Assessment & Plan Note (Signed)
Aggravated by caffeine  - tries to avoid drinking too much

## 2016-02-20 NOTE — Assessment & Plan Note (Signed)
Having injections Taking mobic daily - discussed possible side effects of long term use

## 2016-02-20 NOTE — Progress Notes (Signed)
Pre visit review using our clinic review tool, if applicable. No additional management support is needed unless otherwise documented below in the visit note. 

## 2016-02-20 NOTE — Assessment & Plan Note (Signed)
Check level 

## 2016-02-23 ENCOUNTER — Ambulatory Visit
Admission: RE | Admit: 2016-02-23 | Discharge: 2016-02-23 | Disposition: A | Discharge status: Home / Self Care | Payer: BLUE CROSS/BLUE SHIELD | Source: Ambulatory Visit | Attending: Nurse Practitioner | Admitting: Nurse Practitioner

## 2016-02-23 DIAGNOSIS — Z1231 Encounter for screening mammogram for malignant neoplasm of breast: Secondary | ICD-10-CM

## 2016-02-25 ENCOUNTER — Ambulatory Visit (INDEPENDENT_AMBULATORY_CARE_PROVIDER_SITE_OTHER): Payer: BLUE CROSS/BLUE SHIELD | Admitting: Physical Medicine and Rehabilitation

## 2016-02-25 ENCOUNTER — Encounter (INDEPENDENT_AMBULATORY_CARE_PROVIDER_SITE_OTHER): Payer: Self-pay | Admitting: Physical Medicine and Rehabilitation

## 2016-02-25 VITALS — BP 135/84 | HR 62

## 2016-02-25 DIAGNOSIS — M5416 Radiculopathy, lumbar region: Secondary | ICD-10-CM | POA: Diagnosis not present

## 2016-02-25 DIAGNOSIS — M48062 Spinal stenosis, lumbar region with neurogenic claudication: Secondary | ICD-10-CM | POA: Diagnosis not present

## 2016-02-25 MED ORDER — LIDOCAINE HCL (PF) 1 % IJ SOLN: 0.3300 mL | Freq: Once | INTRAMUSCULAR | Status: DC

## 2016-02-25 MED ORDER — METHYLPREDNISOLONE ACETATE 80 MG/ML IJ SUSP
80.0000 mg | Freq: Once | INTRAMUSCULAR | Status: AC
  Administered 2016-02-25: 80 mg

## 2016-02-25 NOTE — Patient Instructions (Signed)

## 2016-02-25 NOTE — Procedures (Signed)
Lumbosacral Transforaminal Epidural Steroid Injection - Infraneural Approach with Fluoroscopic Guidance  Patient: Michele Bennett      Date of Birth: 10/02/1960 MRN: [id number] PCP: Binnie Rail, MD      Visit Date: 02/25/2016   Universal Protocol:    Date/Time: 12/13/171:48 PM  Consent Given By: the patient  Position: PRONE   Additional Comments: Vital signs were monitored before and after the procedure. Patient was prepped and draped in the usual sterile fashion. The correct patient, procedure, and site was verified.   Injection Procedure Details:  Procedure Site One Meds Administered:  Meds ordered this encounter  Medications  . lidocaine (PF) (XYLOCAINE) 1 % injection 0.3 mL  . methylPREDNISolone acetate (DEPO-MEDROL) injection 80 mg      Laterality: Left  Location/Site:  L3-L4 L4-L5  Needle size: 22 G  Needle type: Spinal  Needle Placement: Transforaminal  Findings:  -Contrast Used: 1 mL iohexol 180 mg iodine/mL   -Comments: Excellent flow of contrast along the nerve and into the epidural space.  Procedure Details: After squaring off the end-plates of the desired vertebral level to get a true AP view, the C-arm was obliqued to the painful side so that the superior articulating process is positioned about 1/3 the length of the inferior endplate.  The needle was aimed toward the junction of the superior articular process and the transverse process of the inferior vertebrae. The needle's initial entry is in the lower third of the foramen through Kambin's triangle. The soft tissues overlying this target were infiltrated with 2-3 ml. of 1% Lidocaine without Epinephrine.  The spinal needle was then inserted and advanced toward the target using a "trajectory" view along the fluoroscope beam.  Under AP and lateral visualization, the needle was advanced so it did not puncture dura and did not traverse medially beyond the 6 o'clock position of the pedicle. Bi-planar  projections were used to confirm position. Aspiration was confirmed to be negative for CSF and/or blood. A 1-2 ml. volume of Isovue-250 was injected and flow of contrast was noted at each level. Radiographs were obtained for documentation purposes.   After attaining the desired flow of contrast documented above, a 0.5 to 1.0 ml test dose of 0.25% Marcaine was injected into each respective transforaminal space.  The patient was observed for 90 seconds post injection.  After no sensory deficits were reported, and normal lower extremity motor function was noted,   the above injectate was administered so that equal amounts of the injectate were placed at each foramen (level) into the transforaminal epidural space.   Additional Comments:  The patient tolerated the procedure well Dressing: Band-Aid    Post-procedure details: Patient was observed during the procedure. Post-procedure instructions were reviewed.  Patient left the clinic in stable condition.

## 2016-02-25 NOTE — Progress Notes (Signed)
IREM AUTERY - 55 y.o. female MRN [id number]  Date of birth: 07/31/1960  Office Visit Note: Visit Date: 02/25/2016 PCP: Binnie Rail, MD Referred by: Binnie Rail, MD  Subjective: Chief Complaint  Patient presents with  . Lower Back - Pain   HPI: Michele Bennett is a very active and pleasant 55 year old female complaining of chronic worsening lower back pain left sided. She reports it is somewhat intermittent but has been worsening over time. Worse with leaning over and transitioning from different positions. She also reports some numbness and tingling in the left lateral thigh. She has an MRI showing broad central left paracentral protrusion at L3-4 as well as severe t arthropathy and left lateral recess stenosis at L4-5.    ROS Otherwise per HPI.  Assessment & Plan: Visit Diagnoses:  1. Lumbar radiculopathy   2. Spinal stenosis of lumbar region with neurogenic claudication     Plan: Findings:  Diagnostic and hopefullyleft L3 and L4 transforaminal epidural steroid injection. I would suggest L5 transforaminal injection if this was not beneficial.    Meds & Orders:  Meds ordered this encounter  Medications  . lidocaine (PF) (XYLOCAINE) 1 % injection 0.3 mL  . methylPREDNISolone acetate (DEPO-MEDROL) injection 80 mg    Orders Placed This Encounter  Procedures  . Epidural Steroid injection    Follow-up: Return for Scheduled follow-up with Dr. Louanne Skye.   Procedures: No procedures performed  Lumbosacral Transforaminal Epidural Steroid Injection - Infraneural Approach with Fluoroscopic Guidance  Patient: Michele Bennett      Date of Birth: Oct 20, 1960 MRN: [id number] PCP: Binnie Rail, MD      Visit Date: 02/25/2016   Universal Protocol:    Date/Time: 12/13/171:48 PM  Consent Given By: the patient  Position: PRONE   Additional Comments: Vital signs were monitored before and after the procedure. Patient was prepped and draped in the usual sterile  fashion. The correct patient, procedure, and site was verified.   Injection Procedure Details:  Procedure Site One Meds Administered:  Meds ordered this encounter  Medications  . lidocaine (PF) (XYLOCAINE) 1 % injection 0.3 mL  . methylPREDNISolone acetate (DEPO-MEDROL) injection 80 mg      Laterality: Left  Location/Site:  L3-L4 L4-L5  Needle size: 22 G  Needle type: Spinal  Needle Placement: Transforaminal  Findings:  -Contrast Used: 1 mL iohexol 180 mg iodine/mL   -Comments: Excellent flow of contrast along the nerve and into the epidural space.  Procedure Details: After squaring off the end-plates of the desired vertebral level to get a true AP view, the C-arm was obliqued to the painful side so that the superior articulating process is positioned about 1/3 the length of the inferior endplate.  The needle was aimed toward the junction of the superior articular process and the transverse process of the inferior vertebrae. The needle's initial entry is in the lower third of the foramen through Kambin's triangle. The soft tissues overlying this target were infiltrated with 2-3 ml. of 1% Lidocaine without Epinephrine.  The spinal needle was then inserted and advanced toward the target using a "trajectory" view along the fluoroscope beam.  Under AP and lateral visualization, the needle was advanced so it did not puncture dura and did not traverse medially beyond the 6 o'clock position of the pedicle. Bi-planar projections were used to confirm position. Aspiration was confirmed to be negative for CSF and/or blood. A 1-2 ml. volume of Isovue-250 was injected and flow of contrast was noted at  each level. Radiographs were obtained for documentation purposes.   After attaining the desired flow of contrast documented above, a 0.5 to 1.0 ml test dose of 0.25% Marcaine was injected into each respective transforaminal space.  The patient was observed for 90 seconds post injection.  After no  sensory deficits were reported, and normal lower extremity motor function was noted,   the above injectate was administered so that equal amounts of the injectate were placed at each foramen (level) into the transforaminal epidural space.   Additional Comments:  The patient tolerated the procedure well Dressing: Band-Aid    Post-procedure details: Patient was observed during the procedure. Post-procedure instructions were reviewed.  Patient left the clinic in stable condition.     Clinical History: Lspine MRI 03/29/2014 IMPRESSION: 1. At L3-4 there is a broad central/left paracentral disc protrusion which has mild mass effect on the left intraspinal L4 nerve root. Mild bilateral facet arthropathy. There is mild relative central canal narrowing. 2. At L4-5 there is a moderate broad-based disc bulge. Severe bilateral facet arthropathy with ligamentum flavum infolding resulting in mild spinal stenosis and severe left lateral recess stenosis. There is a 10 x 17 mm left facet extra-spinal synovial cyst.  She reports that she has never smoked. She has never used smokeless tobacco. No results for input(s): HGBA1C, LABURIC in the last 8760 hours.  Objective:  VS:  HT:    WT:   BMI:     BP:135/84  HR:62bpm  TEMP: ( )  RESP:98 % Physical Exam  Musculoskeletal:  The patient ambulates without aid with good distal strength.    Ortho Exam Imaging: No results found.  Past Medical/Family/Surgical/Social History: Medications & Allergies reviewed per EMR Patient Active Problem List   Diagnosis Date Noted  . Lumbar radiculopathy 02/25/2016  . Spinal stenosis of lumbar region 02/20/2016  . DDD (degenerative disc disease), cervical 10/19/2012  . PVCs (premature ventricular contractions) 03/12/2011  . Vitamin D deficiency 01/01/2008  . Dysmenorrhea 01/01/2008  . DJD (degenerative joint disease), multiple sites 01/01/2008   Past Medical History:  Diagnosis Date  . Allergy   . Cyst     hip; probable epidemoid inclusion cyst; resolution with antibiotics  . Right knee meniscal tear  07/2011   Dr Hal Morales, Ortho; S/P cortisone injection  . Tear of left acetabular labrum 2006 ?   Dr Sallyanne Havers  . Vitamin D deficiency    Family History  Problem Relation Age of Onset  . Parkinsonism Father   . Colon polyps Father     No colon cancer  . Transient ischemic attack Father   . Breast cancer Mother 37  . Coronary artery disease Mother     ? radiation related  . Arthritis Mother     DJD  . Arthritis Maternal Grandmother     DJD  . Diabetes Maternal Grandfather   . Heart attack Neg Hx    Past Surgical History:  Procedure Laterality Date  . ANKLE SURGERY  1989    Post fracture   . DILATION AND CURETTAGE OF UTERUS  1988  . FOOT SURGERY     left- bone spurs, Dr Mayer Camel  . HAND SURGERY     Thumb surgery x 2  . KNEE ARTHROSCOPY Right 2013  . LAPAROTOMY  1988   For Dysmenorrhea   Social History   Occupational History  . Not on file.   Social History Main Topics  . Smoking status: Never Smoker  . Smokeless tobacco: Never Used  . Alcohol  use 1.2 - 1.8 oz/week    2 - 3 Glasses of wine per week  . Drug use: No  . Sexual activity: Not Currently    Partners: Male

## 2016-03-01 ENCOUNTER — Other Ambulatory Visit (INDEPENDENT_AMBULATORY_CARE_PROVIDER_SITE_OTHER): Payer: BLUE CROSS/BLUE SHIELD

## 2016-03-01 DIAGNOSIS — E559 Vitamin D deficiency, unspecified: Secondary | ICD-10-CM | POA: Diagnosis not present

## 2016-03-01 DIAGNOSIS — Z0000 Encounter for general adult medical examination without abnormal findings: Secondary | ICD-10-CM | POA: Diagnosis not present

## 2016-03-01 DIAGNOSIS — N946 Dysmenorrhea, unspecified: Secondary | ICD-10-CM

## 2016-03-01 LAB — CBC WITH DIFFERENTIAL/PLATELET
BASOS PCT: 0.9 % (ref 0.0–3.0)
Basophils Absolute: 0.1 10*3/uL (ref 0.0–0.1)
EOS ABS: 0.1 10*3/uL (ref 0.0–0.7)
EOS PCT: 1.9 % (ref 0.0–5.0)
HEMATOCRIT: 41.3 % (ref 36.0–46.0)
HEMOGLOBIN: 13.6 g/dL (ref 12.0–15.0)
LYMPHS PCT: 35.7 % (ref 12.0–46.0)
Lymphs Abs: 2.4 10*3/uL (ref 0.7–4.0)
MCHC: 33 g/dL (ref 30.0–36.0)
MCV: 94.9 fl (ref 78.0–100.0)
MONOS PCT: 9.1 % (ref 3.0–12.0)
Monocytes Absolute: 0.6 10*3/uL (ref 0.1–1.0)
Neutro Abs: 3.4 10*3/uL (ref 1.4–7.7)
Neutrophils Relative %: 52.4 % (ref 43.0–77.0)
Platelets: 300 10*3/uL (ref 150.0–400.0)
RBC: 4.35 Mil/uL (ref 3.87–5.11)
RDW: 13.4 % (ref 11.5–15.5)
WBC: 6.6 10*3/uL (ref 4.0–10.5)

## 2016-03-01 LAB — LIPID PANEL
CHOLESTEROL: 173 mg/dL (ref 0–200)
HDL: 67.3 mg/dL (ref >39.00)
LDL CALC: 90 mg/dL (ref 0–99)
NonHDL: 105.63
TRIGLYCERIDES: 79 mg/dL (ref 0.0–149.0)
Total CHOL/HDL Ratio: 3
VLDL: 15.8 mg/dL (ref 0.0–40.0)

## 2016-03-01 LAB — COMPREHENSIVE METABOLIC PANEL
ALBUMIN: 4.2 g/dL (ref 3.5–5.2)
ALT: 14 U/L (ref 0–35)
AST: 13 U/L (ref 0–37)
Alkaline Phosphatase: 51 U/L (ref 39–117)
BUN: 13 mg/dL (ref 6–23)
CALCIUM: 9.5 mg/dL (ref 8.4–10.5)
CHLORIDE: 106 meq/L (ref 96–112)
CO2: 28 mEq/L (ref 19–32)
CREATININE: 0.97 mg/dL (ref 0.40–1.20)
GFR: 63.55 mL/min (ref >60.00)
Glucose, Bld: 86 mg/dL (ref 70–99)
Potassium: 4.1 mEq/L (ref 3.5–5.1)
Sodium: 140 mEq/L (ref 135–145)
Total Bilirubin: 0.5 mg/dL (ref 0.2–1.2)
Total Protein: 6.6 g/dL (ref 6.0–8.3)

## 2016-03-01 LAB — FERRITIN: FERRITIN: 35 ng/mL (ref 10.0–291.0)

## 2016-03-01 LAB — IRON: Iron: 131 ug/dL (ref 42–145)

## 2016-03-01 LAB — TSH: TSH: 1.34 u[IU]/mL (ref 0.35–4.50)

## 2016-03-01 LAB — VITAMIN D 25 HYDROXY (VIT D DEFICIENCY, FRACTURES): VITD: 23.39 ng/mL — ABNORMAL LOW (ref 30.00–100.00)

## 2016-03-02 ENCOUNTER — Encounter: Payer: Self-pay | Admitting: Internal Medicine

## 2016-03-03 NOTE — Telephone Encounter (Signed)
done

## 2016-03-03 NOTE — Telephone Encounter (Signed)
See mychart message - please mail her a copy of her blood work.

## 2016-03-29 ENCOUNTER — Encounter: Payer: Self-pay | Admitting: Nurse Practitioner

## 2016-03-29 ENCOUNTER — Ambulatory Visit (INDEPENDENT_AMBULATORY_CARE_PROVIDER_SITE_OTHER): Payer: BLUE CROSS/BLUE SHIELD | Admitting: Nurse Practitioner

## 2016-03-29 VITALS — BP 116/74 | HR 80 | Ht 67.0 in | Wt 165.0 lb

## 2016-03-29 DIAGNOSIS — Z0000 Encounter for general adult medical examination without abnormal findings: Secondary | ICD-10-CM

## 2016-03-29 DIAGNOSIS — Z01419 Encounter for gynecological examination (general) (routine) without abnormal findings: Secondary | ICD-10-CM | POA: Diagnosis not present

## 2016-03-29 DIAGNOSIS — N926 Irregular menstruation, unspecified: Secondary | ICD-10-CM

## 2016-03-29 LAB — POCT URINALYSIS DIPSTICK
Bilirubin, UA: NEGATIVE
Blood, UA: NEGATIVE
Glucose, UA: NEGATIVE
KETONES UA: NEGATIVE
LEUKOCYTES UA: NEGATIVE
Nitrite, UA: NEGATIVE
PH UA: 6
Protein, UA: NEGATIVE
UROBILINOGEN UA: NEGATIVE

## 2016-03-29 NOTE — Progress Notes (Signed)
Patient ID: Michele Bennett, female   DOB: [birth year], 56 y.o.   MRN: [id number]  56 y.o. G0P0000 Single  Caucasian Fe here for annual exam.  Father passed 06/09/2015.  Menses is spotting for a few days, then skips a few weeks and another cycle that is moderate flow.  Occasionally will skip months and will have a heavier flow. Denies vaso symptoms.  Hx of Spinal stenosis and had steroid injection of the spine but was without help.  Patient's last menstrual period was 03/10/2016 (exact date).          Sexually active: No.  The current method of family planning is abstinence.    Exercising: Yes.    walking and stretcing daily Smoker:  no  Health Maintenance: Pap: 03/18/14, Negative with neg HR HPV MMG: 02/23/16, Bi-Rads 1: Negative Colonoscopy:  05/25/13, Negative, repeat in 7 years BMD: never TDaP: 12/16/15 Hep C and HIV: 05/31/12 Labs: PCP  Urine: Negative    reports that she has never smoked. She has never used smokeless tobacco. She reports that she drinks about 1.2 - 1.8 oz of alcohol per week . She reports that she does not use drugs.  Past Medical History:  Diagnosis Date  . Allergy   . Cyst    hip; probable epidemoid inclusion cyst; resolution with antibiotics  . Right knee meniscal tear  07/2011   Dr Hal Morales, Ortho; S/P cortisone injection  . Spinal stenosis at L4-L5 level 2017   bilateral  . Tear of left acetabular labrum 2006 ?   Dr Sallyanne Havers  . Vitamin D deficiency     Past Surgical History:  Procedure Laterality Date  . ANKLE SURGERY  1989    Post fracture   . DILATION AND CURETTAGE OF UTERUS  1988  . FOOT SURGERY     left- bone spurs, Dr Mayer Camel  . HAND SURGERY     Thumb surgery x 2  . KNEE ARTHROSCOPY Right 2013  . LAPAROTOMY  1988   For Dysmenorrhea    Current Outpatient Prescriptions  Medication Sig Dispense Refill  . Cholecalciferol (VITAMIN D3) 2000 UNITS TABS Take by mouth daily.      . diclofenac sodium (VOLTAREN) 1 % GEL Apply 4 g topically 4 (four) times  daily. 100 g 3  . fexofenadine (ALLEGRA) 180 MG tablet Take 180 mg by mouth as needed.     . meloxicam (MOBIC) 7.5 MG tablet Take 7.5 mg by mouth daily.    . Misc Natural Products (TART CHERRY ADVANCED PO) Take by mouth.    . TURMERIC PO Take by mouth daily.     Current Facility-Administered Medications  Medication Dose Route Frequency Provider Last Rate Last Dose  . lidocaine (PF) (XYLOCAINE) 1 % injection 0.3 mL  0.3 mL Other Once Magnus Sinning, MD        Family History  Problem Relation Age of Onset  . Parkinsonism Father   . Colon polyps Father     No colon cancer  . Transient ischemic attack Father   . Breast cancer Mother 69  . Coronary artery disease Mother     ? radiation related  . Arthritis Mother     DJD  . Arthritis Maternal Grandmother     DJD  . Diabetes Maternal Grandfather   . Heart attack Neg Hx     ROS:  Pertinent items are noted in HPI.  Otherwise, a comprehensive ROS was negative.  Exam:   BP 116/74 (BP Location: Right Arm, Patient Position:  Sitting, Cuff Size: Normal)   Pulse 80   Ht 5\' 7"  (1.702 m)   Wt 165 lb (74.8 kg)   LMP 03/10/2016 (Exact Date)   BMI 25.84 kg/m  Height: 5\' 7"  (170.2 cm) Ht Readings from Last 3 Encounters:  03/29/16 5\' 7"  (1.702 m)  02/20/16 5\' 7"  (1.702 m)  02/12/16 5\' 7"  (1.702 m)    General appearance: alert, cooperative and appears stated age Head: Normocephalic, without obvious abnormality, atraumatic Neck: no adenopathy, supple, symmetrical, trachea midline and thyroid normal to inspection and palpation Lungs: clear to auscultation bilaterally Breasts: normal appearance, no masses or tenderness Heart: regular rate and rhythm Abdomen: soft, non-tender; no masses,  no organomegaly Extremities: extremities normal, atraumatic, no cyanosis or edema Skin: Skin color, texture, turgor normal. No rashes or lesions Lymph nodes: Cervical, supraclavicular, and axillary nodes normal. No abnormal inguinal nodes  palpated Neurologic: Grossly normal   Pelvic: External genitalia:  no lesions              Urethra:  normal appearing urethra with no masses, tenderness or lesions              Bartholin's and Skene's: normal                 Vagina: normal appearing vagina with normal color and discharge, no lesions              Cervix: anteverted              Pap taken: No. Bimanual Exam:  Uterus:  normal size, contour, position, consistency, mobility, non-tender              Adnexa: no mass, fullness, tenderness               Rectovaginal: Confirms               Anus:  normal sphincter tone, no lesions  Chaperone present: yes  A:  Well Woman with normal exam  Perimenopause consistent with irregular menses - will keep a menstrual record and if no menses > 90 days to call back. Condoms for birth control if SA             Family stressors with fathers death this past year. Cares for mother when needed.  Vit D deficiency   P:   Reviewed health and wellness pertinent to exam  Pap smear not done  Mammogram is due 12/18  Labs reviewed in Epic  Counseled on breast self exam, mammography screening, adequate intake of calcium and vitamin D, diet and exercise return annually or prn  An After Visit Summary was printed and given to the patient.

## 2016-03-29 NOTE — Patient Instructions (Addendum)

## 2016-04-01 NOTE — Progress Notes (Signed)
Encounter reviewed by Dr. Brook Amundson C. Silva.  

## 2016-04-14 ENCOUNTER — Encounter (INDEPENDENT_AMBULATORY_CARE_PROVIDER_SITE_OTHER): Payer: Self-pay | Admitting: Specialist

## 2016-04-14 ENCOUNTER — Ambulatory Visit (INDEPENDENT_AMBULATORY_CARE_PROVIDER_SITE_OTHER): Payer: BLUE CROSS/BLUE SHIELD | Admitting: Specialist

## 2016-04-14 VITALS — BP 109/66 | HR 63 | Ht 67.0 in | Wt 167.0 lb

## 2016-04-14 DIAGNOSIS — M48062 Spinal stenosis, lumbar region with neurogenic claudication: Secondary | ICD-10-CM | POA: Diagnosis not present

## 2016-04-14 DIAGNOSIS — M170 Bilateral primary osteoarthritis of knee: Secondary | ICD-10-CM | POA: Diagnosis not present

## 2016-04-14 DIAGNOSIS — M51369 Other intervertebral disc degeneration, lumbar region without mention of lumbar back pain or lower extremity pain: Secondary | ICD-10-CM

## 2016-04-14 DIAGNOSIS — M5136 Other intervertebral disc degeneration, lumbar region: Secondary | ICD-10-CM | POA: Diagnosis not present

## 2016-04-14 NOTE — Patient Instructions (Signed)
Avoid bending, stooping and avoid lifting weights greater than 10 lbs. Avoid prolong standing and walking. Avoid frequent bending and stooping  No lifting greater than 10 lbs. May use ice or moist heat for pain. Weight loss is of benefit. Handicap license is approved.  The main ways of treat osteoarthritis, that are found to be success. Weight loss helps to decrease pain. Exercise is important to maintaining cartilage and thickness and strengthening. NSAIDs like motrin, tylenol, alleve are meds decreasing the inflamation. Ice is okay  In afternoon and evening and hot shower in the am

## 2016-04-14 NOTE — Progress Notes (Signed)
Office Visit Note   Patient: Michele Bennett           Date of Birth: January 16, 1961           MRN: [id number] Visit Date: 04/14/2016              Requested by: Binnie Rail, MD [place], [place] [address] PCP: Binnie Rail, MD   Assessment & Plan: Visit Diagnoses:  1. DDD (degenerative disc disease), lumbar   2. Spinal stenosis of lumbar region with neurogenic claudication   3. Bilateral primary osteoarthritis of knee     Plan: Avoid bending, stooping and avoid lifting weights greater than 10 lbs. Avoid prolong standing and walking. Avoid frequent bending and stooping  No lifting greater than 10 lbs. May use ice or moist heat for pain. Weight loss is of benefit. Handicap license is approved. The main ways of treat osteoarthritis, that are found to be success. Weight loss helps to decrease pain. Exercise is important to maintaining cartilage and thickness and strengthening. NSAIDs like motrin, tylenol, alleve are meds decreasing the inflamation. Ice is okay  In afternoon and evening and hot shower in the am    Follow-Up Instructions: No Follow-up on file.   Orders:  No orders of the defined types were placed in this encounter.  No orders of the defined types were placed in this encounter.     Procedures: No procedures performed   Clinical Data: No additional findings.   Subjective: Chief Complaint  Patient presents with  . Lower Back - Pain, Follow-up    Ms. Kuechle is here to follow up on her back problem.  On 02/25/2016, she had a Left L3-4, L4-5 Transforaminal injection with Dr. Ernestina Patches.  She states that the injection did not do anything for the numbness in her let thigh.      Review of Systems   Objective: Vital Signs: BP 109/66 (BP Location: Left Arm, Patient Position: Sitting)   Pulse 63   Ht 5\' 7"  (1.702 m)   Wt 167 lb (75.8 kg)   BMI 26.16 kg/m   Physical Exam  Constitutional: She is oriented to person, place, and time. She  appears well-developed and well-nourished.  HENT:  Head: Normocephalic and atraumatic.  Eyes: EOM are normal. Pupils are equal, round, and reactive to light.  Neck: Normal range of motion. Neck supple.  Pulmonary/Chest: Effort normal and breath sounds normal.  Abdominal: Soft. Bowel sounds are normal.  Neurological: She is alert and oriented to person, place, and time.  Skin: Skin is warm and dry.  Psychiatric: She has a normal mood and affect. Her behavior is normal. Judgment and thought content normal.    Back Exam   Tenderness  The patient is experiencing tenderness in the lumbar.  Range of Motion  Extension: abnormal  Flexion: abnormal  Lateral Bend Right: abnormal  Lateral Bend Left: abnormal  Rotation Right: abnormal  Rotation Left: abnormal   Muscle Strength  Right Quadriceps:  5/5  Left Quadriceps:  5/5  Right Hamstrings:  5/5  Left Hamstrings:  5/5   Tests  Straight leg raise right: negative Straight leg raise left: negative  Reflexes  Patellar: normal Achilles: normal Biceps: normal Babinski's sign: normal   Other  Toe Walk: normal Heel Walk: normal Sensation: decreased Gait: normal  Erythema: no back redness Scars: absent      Specialty Comments:  No specialty comments available.  Imaging: No results found.   PMFS History: Patient  Active Problem List   Diagnosis Date Noted  . Lumbar radiculopathy 02/25/2016  . Spinal stenosis of lumbar region 02/20/2016  . DDD (degenerative disc disease), cervical 10/19/2012  . PVCs (premature ventricular contractions) 03/12/2011  . Vitamin D deficiency 01/01/2008  . Dysmenorrhea 01/01/2008  . DJD (degenerative joint disease), multiple sites 01/01/2008   Past Medical History:  Diagnosis Date  . Allergy   . Cyst    hip; probable epidemoid inclusion cyst; resolution with antibiotics  . Right knee meniscal tear  07/2011   Dr Hal Morales, Ortho; S/P cortisone injection  . Spinal stenosis at L4-L5 level  2017   bilateral  . Tear of left acetabular labrum 2006 ?   Dr Sallyanne Havers  . Vitamin D deficiency     Family History  Problem Relation Age of Onset  . Parkinsonism Father   . Colon polyps Father     No colon cancer  . Transient ischemic attack Father   . Breast cancer Mother 68  . Coronary artery disease Mother     ? radiation related  . Arthritis Mother     DJD  . Arthritis Maternal Grandmother     DJD  . Diabetes Maternal Grandfather   . Heart attack Neg Hx     Past Surgical History:  Procedure Laterality Date  . ANKLE SURGERY  1989    Post fracture   . DILATION AND CURETTAGE OF UTERUS  1988  . FOOT SURGERY     left- bone spurs, Dr Mayer Camel  . HAND SURGERY     Thumb surgery x 2  . KNEE ARTHROSCOPY Right 2013  . LAPAROTOMY  1988   For Dysmenorrhea   Social History   Occupational History  . Not on file.   Social History Main Topics  . Smoking status: Never Smoker  . Smokeless tobacco: Never Used  . Alcohol use 1.2 - 1.8 oz/week    2 - 3 Glasses of wine per week  . Drug use: No  . Sexual activity: Not Currently    Partners: Male

## 2016-07-20 ENCOUNTER — Other Ambulatory Visit: Payer: Self-pay | Admitting: Rheumatology

## 2016-07-21 ENCOUNTER — Telehealth: Payer: Self-pay | Admitting: Rheumatology

## 2016-07-21 NOTE — Telephone Encounter (Signed)
LM on patients mobile number to call back to schedule rov.

## 2016-07-21 NOTE — Telephone Encounter (Signed)
-----   Message from Carole Binning, LPN sent at 10/21/6771  8:20 AM EDT ----- Regarding: please schedule patient a follow up visit Please schedule patient a follow visit. Patient is due August 2018. Thanks!

## 2016-07-21 NOTE — Telephone Encounter (Signed)
Last Visit: 10/30/15 Next Visit due August 2018. Message sent to front to schedule patient.  Labs: CBC/CMP WNL 03/01/16  Okay to refill Mobic?

## 2016-11-24 ENCOUNTER — Encounter: Payer: Self-pay | Admitting: Internal Medicine

## 2016-11-24 ENCOUNTER — Ambulatory Visit (INDEPENDENT_AMBULATORY_CARE_PROVIDER_SITE_OTHER): Payer: BLUE CROSS/BLUE SHIELD | Admitting: Internal Medicine

## 2016-11-24 VITALS — BP 128/86 | HR 87 | Temp 98.6°F | Ht 67.0 in | Wt 160.0 lb

## 2016-11-24 DIAGNOSIS — J029 Acute pharyngitis, unspecified: Secondary | ICD-10-CM | POA: Diagnosis not present

## 2016-11-24 DIAGNOSIS — H9201 Otalgia, right ear: Secondary | ICD-10-CM

## 2016-11-24 DIAGNOSIS — K121 Other forms of stomatitis: Secondary | ICD-10-CM

## 2016-11-24 MED ORDER — TRIAMCINOLONE ACETONIDE 0.1 % MT PSTE: 1.0000 "application " | PASTE | Freq: Two times a day (BID) | OROMUCOSAL | 12 refills | Status: DC

## 2016-11-24 MED ORDER — LEVOFLOXACIN 500 MG PO TABS: 500.0000 mg | ORAL_TABLET | Freq: Every day | ORAL | 0 refills | Status: AC

## 2016-11-24 NOTE — Progress Notes (Signed)
Subjective:    Patient ID: Michele Bennett, female    DOB: 16-Jul-1960, 56 y.o.   MRN: [id number]  HPI   Here with 2-3 days acute onset fever, severe right ear pain, facial pain, pressure, headache, general weakness and malaise, and greenish d/c, with mild ST and cough, but pt denies chest pain, wheezing, increased sob or doe, orthopnea, PND, increased LE swelling, palpitations, dizziness or syncope.  Also associated seems to be a rather large ulceration to the right mouth area quite painful, has been recurrent in the past.   Pt denies wt loss, night sweats, loss of appetite, or other constitutional symptoms   Past Medical History:  Diagnosis Date  . Allergy   . Cyst    hip; probable epidemoid inclusion cyst; resolution with antibiotics  . Right knee meniscal tear  07/2011   Dr Hal Morales, Ortho; S/P cortisone injection  . Spinal stenosis at L4-L5 level 2017   bilateral  . Tear of left acetabular labrum 2006 ?   Dr Sallyanne Havers  . Vitamin D deficiency    Past Surgical History:  Procedure Laterality Date  . ANKLE SURGERY  1989    Post fracture   . DILATION AND CURETTAGE OF UTERUS  1988  . FOOT SURGERY     left- bone spurs, Dr Mayer Camel  . HAND SURGERY     Thumb surgery x 2  . KNEE ARTHROSCOPY Right 2013  . LAPAROTOMY  1988   For Dysmenorrhea    reports that she has never smoked. She has never used smokeless tobacco. She reports that she drinks about 1.2 - 1.8 oz of alcohol per week . She reports that she does not use drugs. family history includes Arthritis in her maternal grandmother and mother; Breast cancer (age of onset: 62) in her mother; Colon polyps in her father; Coronary artery disease in her mother; Diabetes in her maternal grandfather; Parkinsonism in her father; Transient ischemic attack in her father. No Known Allergies Current Outpatient Prescriptions on File Prior to Visit  Medication Sig Dispense Refill  . Cholecalciferol (VITAMIN D3) 2000 UNITS TABS Take by mouth daily.       . diclofenac sodium (VOLTAREN) 1 % GEL Apply 4 g topically 4 (four) times daily. 100 g 3  . fexofenadine (ALLEGRA) 180 MG tablet Take 180 mg by mouth as needed.     . meloxicam (MOBIC) 15 MG tablet TAKE ONE TABLET BY MOUTH DAILY 90 tablet 0  . meloxicam (MOBIC) 7.5 MG tablet Take 7.5 mg by mouth daily.    . Misc Natural Products (TART CHERRY ADVANCED PO) Take by mouth.    . TURMERIC PO Take by mouth daily.     No current facility-administered medications on file prior to visit.    Review of Systems  Constitutional: Negative for other unusual diaphoresis or sweats HENT: Negative for ear discharge or swelling Eyes: Negative for other worsening visual disturbances Respiratory: Negative for stridor or other swelling  Gastrointestinal: Negative for worsening distension or other blood Genitourinary: Negative for retention or other urinary change Musculoskeletal: Negative for other MSK pain or swelling Skin: Negative for color change or other new lesions Neurological: Negative for worsening tremors and other numbness  Psychiatric/Behavioral: Negative for worsening agitation or other fatigue All other system neg per pt    Objective:   Physical Exam BP 128/86   Pulse 87   Temp 98.6 F (37 C) (Oral)   Ht 5\' 7"  (1.702 m)   Wt 160 lb (72.6 kg)  SpO2 100%   BMI 25.06 kg/m  VS noted, mild ill Constitutional: Pt appears in NAD HENT: Head: NCAT.  Right Ear: External ear normal.  Left Ear: External ear normal.  Bilat tm's with erythema but right much more than left.  Max sinus areas mild tender.  Pharynx with mild erythema, no exudate; right inner mouth/cheek with 1/2 cm very superficial ulceration without swelling or drainage Eyes: . Pupils are equal, round, and reactive to light. Conjunctivae and EOM are normal Nose: without d/c or deformity Neck: Neck supple. Gross normal ROM Cardiovascular: Normal rate and regular rhythm.   Pulmonary/Chest: Effort normal and breath sounds without  rales or wheezing.  Neurological: Pt is alert. At baseline orientation, motor grossly intact Skin: Skin is warm. No rashes, other new lesions, no LE edema Psychiatric: Pt behavior is normal without agitation , mild nervous No other exam findings    Assessment & Plan:

## 2016-11-24 NOTE — Patient Instructions (Addendum)
Please take all new medication as prescribed - the antibiotic, and the kenalog in [facility]  Please continue all other medications as before, and refills have been done if requested.  Please have the pharmacy call with any other refills you may need.  Please keep your appointments with your specialists as you may have planned

## 2016-11-27 DIAGNOSIS — H9201 Otalgia, right ear: Secondary | ICD-10-CM | POA: Insufficient documentation

## 2016-11-27 NOTE — Assessment & Plan Note (Signed)
D/w pt, appaers to be referred pain from acute illness/pharyngitis

## 2016-11-27 NOTE — Assessment & Plan Note (Signed)
apthous type it appears, for kenalog in [facility] asd,  to f/u any worsening symptoms or concerns

## 2016-11-27 NOTE — Assessment & Plan Note (Signed)
Mild to mod, for antibx course,  to f/u any worsening symptoms or concerns 

## 2016-12-09 ENCOUNTER — Ambulatory Visit (INDEPENDENT_AMBULATORY_CARE_PROVIDER_SITE_OTHER): Payer: BLUE CROSS/BLUE SHIELD | Admitting: General Practice

## 2016-12-09 DIAGNOSIS — M19071 Primary osteoarthritis, right ankle and foot: Secondary | ICD-10-CM | POA: Insufficient documentation

## 2016-12-09 DIAGNOSIS — M19042 Primary osteoarthritis, left hand: Secondary | ICD-10-CM | POA: Insufficient documentation

## 2016-12-09 DIAGNOSIS — M170 Bilateral primary osteoarthritis of knee: Secondary | ICD-10-CM | POA: Insufficient documentation

## 2016-12-09 DIAGNOSIS — M19072 Primary osteoarthritis, left ankle and foot: Secondary | ICD-10-CM

## 2016-12-09 DIAGNOSIS — M722 Plantar fascial fibromatosis: Secondary | ICD-10-CM | POA: Insufficient documentation

## 2016-12-09 DIAGNOSIS — Z23 Encounter for immunization: Secondary | ICD-10-CM

## 2016-12-09 DIAGNOSIS — M5136 Other intervertebral disc degeneration, lumbar region: Secondary | ICD-10-CM | POA: Insufficient documentation

## 2016-12-09 DIAGNOSIS — M19041 Primary osteoarthritis, right hand: Secondary | ICD-10-CM | POA: Insufficient documentation

## 2016-12-09 DIAGNOSIS — M7711 Lateral epicondylitis, right elbow: Secondary | ICD-10-CM | POA: Insufficient documentation

## 2016-12-09 NOTE — Progress Notes (Deleted)
Office Visit Note  Patient: Michele Bennett             Date of Birth: 1960/09/07           MRN: [id number]             PCP: Binnie Rail, MD Referring: Binnie Rail, MD Visit Date: 12/14/2016 Occupation: @GUAROCC @    Subjective:  No chief complaint on file.   History of Present Illness: Michele Bennett is a 56 y.o. female ***   Activities of Daily Living:  Patient reports morning stiffness for *** {minute/hour:19697}.   Patient {ACTIONS;DENIES/REPORTS:21021675::"Denies"} nocturnal pain.  Difficulty dressing/grooming: {ACTIONS;DENIES/REPORTS:21021675::"Denies"} Difficulty climbing stairs: {ACTIONS;DENIES/REPORTS:21021675::"Denies"} Difficulty getting out of chair: {ACTIONS;DENIES/REPORTS:21021675::"Denies"} Difficulty using hands for taps, buttons, cutlery, and/or writing: {ACTIONS;DENIES/REPORTS:21021675::"Denies"}   No Rheumatology ROS completed.   PMFS History:  Patient Active Problem List   Diagnosis Date Noted  . Primary osteoarthritis of both knees 12/09/2016  . Plantar fasciitis 12/09/2016  . Primary osteoarthritis of both feet 12/09/2016  . Primary osteoarthritis of both hands 12/09/2016  . DDD (degenerative disc disease), lumbar 12/09/2016  . Lateral epicondylitis, right elbow 12/09/2016  . Right ear pain 11/27/2016  . Acute pharyngitis 11/24/2016  . Oral ulcer 11/24/2016  . Lumbar radiculopathy 02/25/2016  . Spinal stenosis of lumbar region 02/20/2016  . DDD (degenerative disc disease), cervical 10/19/2012  . PVCs (premature ventricular contractions) 03/12/2011  . Vitamin D deficiency 01/01/2008  . Dysmenorrhea 01/01/2008  . DJD (degenerative joint disease), multiple sites 01/01/2008    Past Medical History:  Diagnosis Date  . Allergy   . Cyst    hip; probable epidemoid inclusion cyst; resolution with antibiotics  . Right knee meniscal tear  07/2011   Dr Hal Morales, Ortho; S/P cortisone injection  . Spinal stenosis at L4-L5 level 2017   bilateral  . Tear of left acetabular labrum 2006 ?   Dr Sallyanne Havers  . Vitamin D deficiency     Family History  Problem Relation Age of Onset  . Parkinsonism Father   . Colon polyps Father        No colon cancer  . Transient ischemic attack Father   . Breast cancer Mother 70  . Coronary artery disease Mother        ? radiation related  . Arthritis Mother        DJD  . Arthritis Maternal Grandmother        DJD  . Diabetes Maternal Grandfather   . Heart attack Neg Hx    Past Surgical History:  Procedure Laterality Date  . ANKLE SURGERY  1989    Post fracture   . DILATION AND CURETTAGE OF UTERUS  1988  . FOOT SURGERY     left- bone spurs, Dr Mayer Camel  . HAND SURGERY     Thumb surgery x 2  . KNEE ARTHROSCOPY Right 2013  . 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  Walks dog, gardening, PT for back     Objective: Vital Signs: There were no vitals taken for this visit.   Physical Exam   Musculoskeletal Exam: ***  CDAI Exam: No CDAI exam completed.    Investigation: No additional findings.  Imaging: No results found.  Speciality Comments: No specialty comments available.    Procedures:  No procedures performed Allergies: Patient has no known allergies.   Assessment / Plan:     Visit Diagnoses: Primary  osteoarthritis of both hands  Lateral epicondylitis, right elbow  Primary osteoarthritis of both knees  Plantar fasciitis  Primary osteoarthritis of both feet  DDD (degenerative disc disease), cervical  DDD (degenerative disc disease), lumbar  Vitamin D deficiency    Orders: No orders of the defined types were placed in this encounter.  No orders of the defined types were placed in this encounter.   Face-to-face time spent with patient was *** minutes. 50% of time was spent in counseling and coordination of care.  Follow-Up Instructions: No Follow-up on file.   Bertrum Helmstetter, RT  Note - This record has been created using [employer].  Chart creation errors have been sought, but may not always  have been located. Such creation errors do not reflect on  the standard of medical care.

## 2016-12-14 ENCOUNTER — Ambulatory Visit: Payer: BLUE CROSS/BLUE SHIELD | Admitting: Rheumatology

## 2016-12-14 ENCOUNTER — Encounter: Payer: Self-pay | Admitting: Rheumatology

## 2016-12-14 ENCOUNTER — Ambulatory Visit (INDEPENDENT_AMBULATORY_CARE_PROVIDER_SITE_OTHER): Payer: BLUE CROSS/BLUE SHIELD | Admitting: Rheumatology

## 2016-12-14 VITALS — BP 145/81 | HR 71 | Ht 67.0 in | Wt 164.0 lb

## 2016-12-14 DIAGNOSIS — M19071 Primary osteoarthritis, right ankle and foot: Secondary | ICD-10-CM | POA: Diagnosis not present

## 2016-12-14 DIAGNOSIS — Z791 Long term (current) use of non-steroidal anti-inflammatories (NSAID): Secondary | ICD-10-CM

## 2016-12-14 DIAGNOSIS — M19042 Primary osteoarthritis, left hand: Secondary | ICD-10-CM

## 2016-12-14 DIAGNOSIS — M5030 Other cervical disc degeneration, unspecified cervical region: Secondary | ICD-10-CM | POA: Diagnosis not present

## 2016-12-14 DIAGNOSIS — M722 Plantar fascial fibromatosis: Secondary | ICD-10-CM

## 2016-12-14 DIAGNOSIS — M19072 Primary osteoarthritis, left ankle and foot: Secondary | ICD-10-CM | POA: Diagnosis not present

## 2016-12-14 DIAGNOSIS — M170 Bilateral primary osteoarthritis of knee: Secondary | ICD-10-CM | POA: Diagnosis not present

## 2016-12-14 DIAGNOSIS — M5136 Other intervertebral disc degeneration, lumbar region: Secondary | ICD-10-CM

## 2016-12-14 DIAGNOSIS — M65331 Trigger finger, right middle finger: Secondary | ICD-10-CM | POA: Diagnosis not present

## 2016-12-14 DIAGNOSIS — M51369 Other intervertebral disc degeneration, lumbar region without mention of lumbar back pain or lower extremity pain: Secondary | ICD-10-CM

## 2016-12-14 DIAGNOSIS — E559 Vitamin D deficiency, unspecified: Secondary | ICD-10-CM

## 2016-12-14 DIAGNOSIS — M19041 Primary osteoarthritis, right hand: Secondary | ICD-10-CM | POA: Diagnosis not present

## 2016-12-14 MED ORDER — MELOXICAM 15 MG PO TABS: 15.0000 mg | ORAL_TABLET | Freq: Every day | ORAL | 0 refills | Status: DC

## 2016-12-14 NOTE — Progress Notes (Signed)
Office Visit Note  Patient: Michele Bennett             Date of Birth: 1961-01-18           MRN: [id number]             PCP: Binnie Rail, MD Referring: Binnie Rail, MD Visit Date: 12/14/2016 Occupation: @GUAROCC @    Recardo Evangelist:  Follow-up (discuss meds, hand joints, bil lat knee pain)   History of Present Illness: Michele Bennett is a 56 y.o. female with history of osteoarthritis. She states she continues to have pain and discomfort in her right knee joint. In April 2018 she had left meniscal tear repair for torn meniscus. She's been having increased pain in her left knee joint. She reports that her left knee joint is hurting more than her right knee joint. She also has pain and discomfort in her bilateral hands. Her right third finger has been triggering. She had cortisone injection to her lower back due to ongoing discomfort. She does have disc disease of her C-spine and lumbar spine. Her plantar fasciitis is still flares off and on. She's been taking Mobic 7.5 mg by mouth daily or twice a day when necessary.  Activities of Daily Living:  Patient reports morning stiffness for all day .   Patient Reports nocturnal pain. Left knee Difficulty dressing/grooming: Reports Difficulty climbing stairs: Reports Difficulty getting out of chair: Reports Difficulty using hands for taps, buttons, cutlery, and/or writing: Reports   Review of Systems  Constitutional: Positive for fatigue. Negative for night sweats, weight gain, weight loss and weakness.  HENT: Negative for ear pain, mouth sores, trouble swallowing, trouble swallowing, mouth dryness and nose dryness.        Recent ear infection   Eyes: Negative.  Negative for pain, redness, visual disturbance and dryness.  Respiratory: Negative.  Negative for cough, shortness of breath and difficulty breathing.   Cardiovascular: Negative.  Negative for chest pain, palpitations, hypertension, irregular heartbeat and swelling in  legs/feet.  Gastrointestinal: Negative.  Negative for blood in stool, constipation and diarrhea.  Endocrine: Negative.  Negative for increased urination.  Genitourinary: Negative.  Negative for vaginal dryness.  Musculoskeletal: Positive for arthralgias, gait problem, joint pain, joint swelling and morning stiffness. Negative for myalgias, muscle weakness, muscle tenderness and myalgias.  Skin: Negative.  Negative for color change, rash, hair loss, skin tightness, ulcers and sensitivity to sunlight.  Allergic/Immunologic: Negative.  Negative for susceptible to infections.  Neurological: Negative for dizziness, memory loss and night sweats.       Negative  Hematological: Negative.  Negative for swollen glands.  Psychiatric/Behavioral: Negative.  Negative for depressed mood and sleep disturbance. The patient is not nervous/anxious.     PMFS History:  Patient Active Problem List   Diagnosis Date Noted  . Primary osteoarthritis of both knees 12/09/2016  . Plantar fasciitis 12/09/2016  . Primary osteoarthritis of both feet 12/09/2016  . Primary osteoarthritis of both hands 12/09/2016  . DDD (degenerative disc disease), lumbar 12/09/2016  . Lateral epicondylitis, right elbow 12/09/2016  . Right ear pain 11/27/2016  . Acute pharyngitis 11/24/2016  . Oral ulcer 11/24/2016  . Lumbar radiculopathy 02/25/2016  . Spinal stenosis of lumbar region 02/20/2016  . DDD (degenerative disc disease), cervical 10/19/2012  . PVCs (premature ventricular contractions) 03/12/2011  . Vitamin D deficiency 01/01/2008  . Dysmenorrhea 01/01/2008  . DJD (degenerative joint disease), multiple sites 01/01/2008    Past Medical History:  Diagnosis Date  .  Allergy   . Cyst    hip; probable epidemoid inclusion cyst; resolution with antibiotics  . Right knee meniscal tear  07/2011   Dr Hal Morales, Ortho; S/P cortisone injection  . Spinal stenosis at L4-L5 level 2017   bilateral  . Tear of left acetabular labrum 2006 ?    Dr Sallyanne Havers  . Vitamin D deficiency     Family History  Problem Relation Age of Onset  . Parkinsonism Father   . Colon polyps Father        No colon cancer  . Transient ischemic attack Father   . Breast cancer Mother 57  . Coronary artery disease Mother        ? radiation related  . Arthritis Mother        DJD  . Arthritis Maternal Grandmother        DJD  . Diabetes Maternal Grandfather   . Heart attack Neg Hx    Past Surgical History:  Procedure Laterality Date  . ANKLE SURGERY  1989    Post fracture   . DILATION AND CURETTAGE OF UTERUS  1988  . FOOT SURGERY     left- bone spurs, Dr Mayer Camel  . HAND SURGERY     Thumb surgery x 2  . KNEE ARTHROSCOPY Right 2013  . 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  Walks dog, gardening, PT for back     Objective: Vital Signs: BP (!) 145/81 (BP Location: Left Arm, Patient Position: Sitting, Cuff Size: Normal)   Pulse 71   Ht 5\' 7"  (1.702 m)   Wt 164 lb (74.4 kg)   BMI 25.69 kg/m    Physical Exam  Constitutional: She is oriented to person, place, and time. She appears well-developed and well-nourished.  HENT:  Head: Normocephalic and atraumatic.  Eyes: Conjunctivae and EOM are normal.  Neck: Normal range of motion.  Cardiovascular: Normal rate, regular rhythm, normal heart sounds and intact distal pulses.   Pulmonary/Chest: Effort normal and breath sounds normal.  Abdominal: Soft. Bowel sounds are normal.  Lymphadenopathy:    She has no cervical adenopathy.  Neurological: She is alert and oriented to person, place, and time.  Skin: Skin is warm and dry. Capillary refill takes less than 2 seconds.  Psychiatric: She has a normal mood and affect. Her behavior is normal.  Nursing note and vitals reviewed.    Musculoskeletal Exam: C-spine and thoracic lumbar spine limited range of motion with some discomfort. Shoulder joints elbow joints wrist joints  are good range of motion. She has some DIP PIP thickening in her hands. She has some tenderness in the right third flexor tendon of her finger. Hip joints knee joints ankles MTPs PIPs DIPs with good range of motion. She is warmth and some swelling in her left knee joint. She has postsurgical change from arthroscopic surgery in her left knee joint.  CDAI Exam: No CDAI exam completed.    Investigation: No additional findings.   Imaging: No results found.  Speciality Comments: No specialty comments available.    Procedures:  No procedures performed Allergies: Patient has no known allergies.   Assessment / Plan:     Visit Diagnoses: DDD (degenerative disc disease), cervical: She continues to have some stiffness and discomfort.  DDD (degenerative disc disease), lumbar: She has chronic lower back pain. She is doing better after cortisone injection.  Primary osteoarthritis of both hands: Joint protection  and muscle strengthening was discussed.  Trigger middle finger of right hand: Have advised her to use topical Voltaren gel.  Primary osteoarthritis of both knees - right severe , left moderate . She's been having increased pain in her left knee joint after meniscal tear repair. She still have some warmth in her left knee. I've advised her to give it some time. He also had discussion regarding right total knee replacement. She would like to think about it. She has been taking Mobic which is been helpful.  Primary osteoarthritis of both feet: Proper fitting shoes were discussed.  Long term (current) use of non-steroidal anti-inflammatories (nsaid) - Plan: CBC with Differential/Platelet, COMPLETE METABOLIC PANEL WITH GFR  Plantar fasciitis: She has intermittent discomfort.  Vitamin D deficiency - Plan: VITAMIN D 25 Hydroxy (Vit-D Deficiency, Fractures)    Orders: Orders Placed This Encounter  Procedures  . CBC with Differential/Platelet  . COMPLETE METABOLIC PANEL WITH GFR  .  VITAMIN D 25 Hydroxy (Vit-D Deficiency, Fractures)   Meds ordered this encounter  Medications  . meloxicam (MOBIC) 15 MG tablet    Sig: Take 1 tablet (15 mg total) by mouth daily.    Dispense:  90 tablet    Refill:  0    Face-to-face time spent with patient was 30 minutes. Greater than 50% of time was spent in counseling and coordination of care.  Follow-Up Instructions: Return in about 6 months (around 06/14/2017) for Osteoarthritis DDD.   Bo Merino, MD  Note - This record has been created using [employer].  Chart creation errors have been sought, but may not always  have been located. Such creation errors do not reflect on  the standard of medical care.

## 2016-12-14 NOTE — Patient Instructions (Signed)
Knee Exercises Ask your health care provider which exercises are safe for you. Do exercises exactly as told by your health care provider and adjust them as directed. It is normal to feel mild stretching, pulling, tightness, or discomfort as you do these exercises, but you should stop right away if you feel sudden pain or your pain gets worse.Do not begin these exercises until told by your health care provider. STRETCHING AND RANGE OF MOTION EXERCISES These exercises warm up your muscles and joints and improve the movement and flexibility of your knee. These exercises also help to relieve pain, numbness, and tingling. Exercise A: Knee Extension, Prone 1. Lie on your abdomen on a bed. 2. Place your left / right knee just beyond the edge of the surface so your knee is not on the bed. You can put a towel under your left / right thigh just above your knee for comfort. 3. Relax your leg muscles and allow gravity to straighten your knee. You should feel a stretch behind your left / right knee. 4. Hold this position for __________ seconds. 5. Scoot up so your knee is supported between repetitions. Repeat __________ times. Complete this stretch __________ times a day. Exercise B: Knee Flexion, Active  1. Lie on your back with both knees straight. If this causes back discomfort, bend your left / right knee so your foot is flat on the floor. 2. Slowly slide your left / right heel back toward your buttocks until you feel a gentle stretch in the front of your knee or thigh. 3. Hold this position for __________ seconds. 4. Slowly slide your left / right heel back to the starting position. Repeat __________ times. Complete this exercise __________ times a day. Exercise C: Quadriceps, Prone  1. Lie on your abdomen on a firm surface, such as a bed or padded floor. 2. Bend your left / right knee and hold your ankle. If you cannot reach your ankle or pant leg, loop a belt around your foot and grab the belt  instead. 3. Gently pull your heel toward your buttocks. Your knee should not slide out to the side. You should feel a stretch in the front of your thigh and knee. 4. Hold this position for __________ seconds. Repeat __________ times. Complete this stretch __________ times a day. Exercise D: Hamstring, Supine 1. Lie on your back. 2. Loop a belt or towel over the ball of your left / right foot. The ball of your foot is on the walking surface, right under your toes. 3. Straighten your left / right knee and slowly pull on the belt to raise your leg until you feel a gentle stretch behind your knee. ? Do not let your left / right knee bend while you do this. ? Keep your other leg flat on the floor. 4. Hold this position for __________ seconds. Repeat __________ times. Complete this stretch __________ times a day. STRENGTHENING EXERCISES These exercises build strength and endurance in your knee. Endurance is the ability to use your muscles for a long time, even after they get tired. Exercise E: Quadriceps, Isometric  1. Lie on your back with your left / right leg extended and your other knee bent. Put a rolled towel or small pillow under your knee if told by your health care provider. 2. Slowly tense the muscles in the front of your left / right thigh. You should see your kneecap slide up toward your hip or see increased dimpling just above the knee. This   motion will push the back of the knee toward the floor. 3. For __________ seconds, keep the muscle as tight as you can without increasing your pain. 4. Relax the muscles slowly and completely. Repeat __________ times. Complete this exercise __________ times a day. Exercise F: Straight Leg Raises - Quadriceps 1. Lie on your back with your left / right leg extended and your other knee bent. 2. Tense the muscles in the front of your left / right thigh. You should see your kneecap slide up or see increased dimpling just above the knee. Your thigh may  even shake a bit. 3. Keep these muscles tight as you raise your leg 4-6 inches (10-15 cm) off the floor. Do not let your knee bend. 4. Hold this position for __________ seconds. 5. Keep these muscles tense as you lower your leg. 6. Relax your muscles slowly and completely after each repetition. Repeat __________ times. Complete this exercise __________ times a day. Exercise G: Hamstring, Isometric 1. Lie on your back on a firm surface. 2. Bend your left / right knee approximately __________ degrees. 3. Dig your left / right heel into the surface as if you are trying to pull it toward your buttocks. Tighten the muscles in the back of your thighs to dig as hard as you can without increasing any pain. 4. Hold this position for __________ seconds. 5. Release the tension gradually and allow your muscles to relax completely for __________ seconds after each repetition. Repeat __________ times. Complete this exercise __________ times a day. Exercise H: Hamstring Curls  If told by your health care provider, do this exercise while wearing ankle weights. Begin with __________ weights. Then increase the weight by 1 lb (0.5 kg) increments. Do not wear ankle weights that are more than __________. 1. Lie on your abdomen with your legs straight. 2. Bend your left / right knee as far as you can without feeling pain. Keep your hips flat against the floor. 3. Hold this position for __________ seconds. 4. Slowly lower your leg to the starting position.  Repeat __________ times. Complete this exercise __________ times a day. Exercise I: Squats (Quadriceps) 1. Stand in front of a table, with your feet and knees pointing straight ahead. You may rest your hands on the table for balance but not for support. 2. Slowly bend your knees and lower your hips like you are going to sit in a chair. ? Keep your weight over your heels, not over your toes. ? Keep your lower legs upright so they are parallel with the table  legs. ? Do not let your hips go lower than your knees. ? Do not bend lower than told by your health care provider. ? If your knee pain increases, do not bend as low. 3. Hold the squat position for __________ seconds. 4. Slowly push with your legs to return to standing. Do not use your hands to pull yourself to standing. Repeat __________ times. Complete this exercise __________ times a day. Exercise J: Wall Slides (Quadriceps)  1. Lean your back against a smooth wall or door while you walk your feet out 18-24 inches (46-61 cm) from it. 2. Place your feet hip-width apart. 3. Slowly slide down the wall or door until your knees bend __________ degrees. Keep your knees over your heels, not over your toes. Keep your knees in line with your hips. 4. Hold for __________ seconds. Repeat __________ times. Complete this exercise __________ times a day. Exercise K: Straight Leg Raises -   Hip Abductors 1. Lie on your side with your left / right leg in the top position. Lie so your head, shoulder, knee, and hip line up. You may bend your bottom knee to help you keep your balance. 2. Roll your hips slightly forward so your hips are stacked directly over each other and your left / right knee is facing forward. 3. Leading with your heel, lift your top leg 4-6 inches (10-15 cm). You should feel the muscles in your outer hip lifting. ? Do not let your foot drift forward. ? Do not let your knee roll toward the ceiling. 4. Hold this position for __________ seconds. 5. Slowly return your leg to the starting position. 6. Let your muscles relax completely after each repetition. Repeat __________ times. Complete this exercise __________ times a day. Exercise L: Straight Leg Raises - Hip Extensors 1. Lie on your abdomen on a firm surface. You can put a pillow under your hips if that is more comfortable. 2. Tense the muscles in your buttocks and lift your left / right leg about 4-6 inches (10-15 cm). Keep your knee  straight as you lift your leg. 3. Hold this position for __________ seconds. 4. Slowly lower your leg to the starting position. 5. Let your leg relax completely after each repetition. Repeat __________ times. Complete this exercise __________ times a day. This information is not intended to replace advice given to you by your health care provider. Make sure you discuss any questions you have with your health care provider. Document Released: 01/13/2005 Document Revised: 11/24/2015 Document Reviewed: 01/05/2015 Elsevier Interactive Patient Education  2018 [employer] Hand Exercises Hand exercises can be helpful to almost anyone. These exercises can strengthen the hands, improve flexibility and movement, and increase blood flow to the hands. These results can make work and daily tasks easier. Hand exercises can be especially helpful for people who have joint pain from arthritis or have nerve damage from overuse (carpal tunnel syndrome). These exercises can also help people who have injured a hand. Most of these hand exercises are fairly gentle stretching routines. You can do them often throughout the day. Still, it is a good idea to ask your health care provider which exercises would be best for you. Warming your hands before exercise may help to reduce stiffness. You can do this with gentle massage or by placing your hands in warm water for 15 minutes. Also, make sure you pay attention to your level of hand pain as you begin an exercise routine. Exercises Knuckle Bend Repeat this exercise 5-10 times with each hand. 6. Stand or sit with your arm, hand, and all five fingers pointed straight up. Make sure your wrist is straight. 7. Gently and slowly bend your fingers down and inward until the tips of your fingers are touching the tops of your palm. 8. Hold this position for a few seconds. 9. Extend your fingers out to their original position, all pointing straight up again.  Finger Fan Repeat this  exercise 5-10 times with each hand. 5. Hold your arm and hand out in front of you. Keep your wrist straight. 6. Squeeze your hand into a fist. 7. Hold this position for a few seconds. 8. Fan out, or spread apart, your hand and fingers as much as possible, stretching every joint fully.  Tabletop Repeat this exercise 5-10 times with each hand. 5. Stand or sit with your arm, hand, and all five fingers pointed straight up. Make sure your wrist is straight.   6. Gently and slowly bend your fingers at the knuckles where they meet the hand until your hand is making an upside-down L shape. Your fingers should form a tabletop. 7. Hold this position for a few seconds. 8. Extend your fingers out to their original position, all pointing straight up again.  Making Os Repeat this exercise 5-10 times with each hand. 1. Stand or sit with your arm, hand, and all five fingers pointed straight up. Make sure your wrist is straight. 2. Make an O shape by touching your pointer finger to your thumb. Hold for a few seconds. Then open your hand wide. 3. Repeat this motion with each finger on your hand.  Table Spread Repeat this exercise 5-10 times with each hand. 5. Place your hand on a table with your palm facing down. Make sure your wrist is straight. 6. Spread your fingers out as much as possible. Hold this position for a few seconds. 7. Slide your fingers back together again. Hold for a few seconds.  Ball Grip  Repeat this exercise 10-15 times with each hand. 7. Hold a tennis ball or another soft ball in your hand. 8. While slowly increasing pressure, squeeze the ball as hard as possible. 9. Squeeze as hard as you can for 3-5 seconds. 10. Relax and repeat.  Wrist Curls Repeat this exercise 10-15 times with each hand. 6. Sit in a chair that has armrests. 7. Hold a light weight in your hand, such as a dumbbell that weighs 1-3 pounds (0.5-1.4 kg). Ask your health care provider what weight would be best for  you. 8. Rest your hand just over the end of the chair arm with your palm facing up. 9. Gently pivot your wrist up and down while holding the weight. Do not twist your wrist from side to side.  Contact a health care provider if:  Your hand pain or discomfort gets much worse when you do an exercise.  Your hand pain or discomfort does not improve within 2 hours after you exercise. If you have any of these problems, stop doing these exercises right away. Do not do them again unless your health care provider says that you can. Get help right away if:  You develop sudden, severe hand pain. If this happens, stop doing these exercises right away. Do not do them again unless your health care provider says that you can. This information is not intended to replace advice given to you by your health care provider. Make sure you discuss any questions you have with your health care provider. Document Released: 02/10/2015 Document Revised: 08/07/2015 Document Reviewed: 09/09/2014 Elsevier Interactive Patient Education  2018 [employer]  

## 2016-12-15 LAB — CBC WITH DIFFERENTIAL/PLATELET
Basophils Absolute: 81 cells/uL (ref 0–200)
Basophils Relative: 1.3 %
Eosinophils Absolute: 62 cells/uL (ref 15–500)
Eosinophils Relative: 1 %
HCT: 40 % (ref 35.0–45.0)
HEMOGLOBIN: 13.6 g/dL (ref 11.7–15.5)
Lymphs Abs: 1643 cells/uL (ref 850–3900)
MCH: 31.9 pg (ref 27.0–33.0)
MCHC: 34 g/dL (ref 32.0–36.0)
MCV: 93.7 fL (ref 80.0–100.0)
MPV: 10.1 fL (ref 7.5–12.5)
Monocytes Relative: 6.6 %
NEUTROS PCT: 64.6 %
Neutro Abs: 4005 cells/uL (ref 1500–7800)
PLATELETS: 298 10*3/uL (ref 140–400)
RBC: 4.27 10*6/uL (ref 3.80–5.10)
RDW: 12.2 % (ref 11.0–15.0)
TOTAL LYMPHOCYTE: 26.5 %
WBC: 6.2 10*3/uL (ref 3.8–10.8)
WBCMIX: 409 {cells}/uL (ref 200–950)

## 2016-12-15 LAB — COMPLETE METABOLIC PANEL WITH GFR
AG Ratio: 1.8 (calc) (ref 1.0–2.5)
ALBUMIN MSPROF: 4.4 g/dL (ref 3.6–5.1)
ALKALINE PHOSPHATASE (APISO): 54 U/L (ref 33–130)
ALT: 12 U/L (ref 6–29)
AST: 15 U/L (ref 10–35)
BUN: 12 mg/dL (ref 7–25)
CHLORIDE: 104 mmol/L (ref 98–110)
CO2: 28 mmol/L (ref 20–32)
CREATININE: 0.87 mg/dL (ref 0.50–1.05)
Calcium: 9.8 mg/dL (ref 8.6–10.4)
GFR, Est African American: 87 mL/min/{1.73_m2} (ref >=60)
GFR, Est Non African American: 75 mL/min/{1.73_m2} (ref >=60)
GLUCOSE: 96 mg/dL (ref 65–99)
Globulin: 2.5 g/dL (calc) (ref 1.9–3.7)
Potassium: 4.4 mmol/L (ref 3.5–5.3)
Sodium: 140 mmol/L (ref 135–146)
Total Bilirubin: 0.6 mg/dL (ref 0.2–1.2)
Total Protein: 6.9 g/dL (ref 6.1–8.1)

## 2016-12-15 LAB — VITAMIN D 25 HYDROXY (VIT D DEFICIENCY, FRACTURES): Vit D, 25-Hydroxy: 36 ng/mL (ref 30–100)

## 2016-12-15 NOTE — Progress Notes (Signed)
Within normal limits. Should take vitamin D 1000 units daily.

## 2017-01-31 ENCOUNTER — Telehealth (INDEPENDENT_AMBULATORY_CARE_PROVIDER_SITE_OTHER): Payer: Self-pay | Admitting: Physical Medicine and Rehabilitation

## 2017-01-31 NOTE — Telephone Encounter (Signed)
Note from [facility] on Follow up from injection stated it did not help with thigh numbness, see if helped overal then ok to re-inject or follow up with [facility]

## 2017-01-31 NOTE — Telephone Encounter (Signed)
Said last injection did help. Scheduled for 02/14/17 at [place] with a driver.

## 2017-02-14 ENCOUNTER — Ambulatory Visit (INDEPENDENT_AMBULATORY_CARE_PROVIDER_SITE_OTHER): Payer: BLUE CROSS/BLUE SHIELD

## 2017-02-14 ENCOUNTER — Encounter (INDEPENDENT_AMBULATORY_CARE_PROVIDER_SITE_OTHER): Payer: Self-pay | Admitting: Physical Medicine and Rehabilitation

## 2017-02-14 ENCOUNTER — Ambulatory Visit (INDEPENDENT_AMBULATORY_CARE_PROVIDER_SITE_OTHER): Payer: BLUE CROSS/BLUE SHIELD | Admitting: Physical Medicine and Rehabilitation

## 2017-02-14 VITALS — BP 144/81 | HR 62 | Temp 98.4°F

## 2017-02-14 DIAGNOSIS — M5116 Intervertebral disc disorders with radiculopathy, lumbar region: Secondary | ICD-10-CM | POA: Diagnosis not present

## 2017-02-14 DIAGNOSIS — M5416 Radiculopathy, lumbar region: Secondary | ICD-10-CM

## 2017-02-14 MED ORDER — BETAMETHASONE SOD PHOS & ACET 6 (3-3) MG/ML IJ SUSP
12.0000 mg | Freq: Once | INTRAMUSCULAR | Status: AC
  Administered 2017-02-14: 12 mg

## 2017-02-14 MED ORDER — LIDOCAINE HCL (PF) 1 % IJ SOLN
2.0000 mL | Freq: Once | INTRAMUSCULAR | Status: AC
  Administered 2017-02-14: 2 mL

## 2017-02-14 NOTE — Patient Instructions (Signed)

## 2017-02-14 NOTE — Progress Notes (Deleted)
Se states that she has sharp pain, and pain with transitioning from sitting to standing, really bad getting getting up out of bed, coughing and sneezing gives her sharp pain in her left lower back if she has not walked out her pain before she does.  + driver, -bts,-dye allergy

## 2017-02-18 NOTE — Procedures (Signed)
Michele Bennett is a 56 year old sharp shooting pain in the low back and left hip particularly with coughing and transitioning from sitting to standing.  She has a prior history of disc herniation at L3-4 and facet joint arthritis at L4-5 with significant lateral recess narrowing on the left.  Prior injection in 2017 in December gave her quite a bit of relief.  She is followed by Dr. Louanne Skye in our office.  We will repeat the injection today.  She will follow-up with Dr. Louanne Skye as needed.  The injection  will be diagnostic and hopefully therapeutic. The patient has failed conservative care including time, medications and activity modification.  Lumbosacral Transforaminal Epidural Steroid Injection - Sub-Pedicular Approach with Fluoroscopic Guidance  Patient: Michele Bennett      Date of Birth: Nov 16, 1960 MRN: [id number] PCP: Binnie Rail, MD      Visit Date: 02/14/2017   Universal Protocol:    Date/Time: 02/14/2017  Consent Given By: the patient  Position: PRONE  Additional Comments: Vital signs were monitored before and after the procedure. Patient was prepped and draped in the usual sterile fashion. The correct patient, procedure, and site was verified.   Injection Procedure Details:  Procedure Site One Meds Administered:  Meds ordered this encounter  Medications  . lidocaine (PF) (XYLOCAINE) 1 % injection 2 mL  . betamethasone acetate-betamethasone sodium phosphate (CELESTONE) injection 12 mg    Laterality: Left  Location/Site:  L3-L4 L4-L5  Needle size: 22 G  Needle type: Spinal  Needle Placement: Transforaminal  Findings:    -Comments: Excellent flow of contrast along the nerve and into the epidural space.  Procedure Details: After squaring off the end-plates to get a true AP view, the C-arm was positioned so that an oblique view of the foramen as noted above was visualized. The target area is just inferior to the "nose of the scotty dog" or sub pedicular. The soft  tissues overlying this structure were infiltrated with 2-3 ml. of 1% Lidocaine without Epinephrine.  The spinal needle was inserted toward the target using a "trajectory" view along the fluoroscope beam.  Under AP and lateral visualization, the needle was advanced so it did not puncture dura and was located close the 6 O'Clock position of the pedical in AP tracterory. Biplanar projections were used to confirm position. Aspiration was confirmed to be negative for CSF and/or blood. A 1-2 ml. volume of Isovue-250 was injected and flow of contrast was noted at each level. Radiographs were obtained for documentation purposes.   After attaining the desired flow of contrast documented above, a 0.5 to 1.0 ml test dose of 0.25% Marcaine was injected into each respective transforaminal space.  The patient was observed for 90 seconds post injection.  After no sensory deficits were reported, and normal lower extremity motor function was noted,   the above injectate was administered so that equal amounts of the injectate were placed at each foramen (level) into the transforaminal epidural space.   Additional Comments:  The patient tolerated the procedure well Dressing: Band-Aid    Post-procedure details: Patient was observed during the procedure. Post-procedure instructions were reviewed.  Patient left the clinic in stable condition.   Pertinent Imaging: Lspine MRI 03/29/2014 IMPRESSION: 1. At L3-4 there is a broad central/left paracentral disc protrusion which has mild mass effect on the left intraspinal L4 nerve root. Mild bilateral facet arthropathy. There is mild relative central canal narrowing. 2. At L4-5 there is a moderate broad-based disc bulge. Severe bilateral facet arthropathy  with ligamentum flavum infolding resulting in mild spinal stenosis and severe left lateral recess stenosis. There is a 10 x 17 mm left facet extra-spinal synovial cyst.

## 2017-03-15 DIAGNOSIS — C801 Malignant (primary) neoplasm, unspecified: Secondary | ICD-10-CM

## 2017-03-15 HISTORY — DX: Malignant (primary) neoplasm, unspecified: C80.1

## 2017-03-25 ENCOUNTER — Telehealth: Payer: Self-pay | Admitting: Obstetrics and Gynecology

## 2017-03-25 NOTE — Telephone Encounter (Signed)
Patient is aware her appointment has been cancelled for 04/01/17 and she is on a wait list.

## 2017-04-01 ENCOUNTER — Ambulatory Visit: Payer: BLUE CROSS/BLUE SHIELD | Admitting: Obstetrics and Gynecology

## 2017-04-04 ENCOUNTER — Ambulatory Visit: Payer: BLUE CROSS/BLUE SHIELD | Admitting: Nurse Practitioner

## 2017-04-07 ENCOUNTER — Other Ambulatory Visit: Payer: Self-pay | Admitting: Obstetrics and Gynecology

## 2017-04-07 DIAGNOSIS — Z139 Encounter for screening, unspecified: Secondary | ICD-10-CM

## 2017-04-22 ENCOUNTER — Ambulatory Visit (INDEPENDENT_AMBULATORY_CARE_PROVIDER_SITE_OTHER): Payer: BLUE CROSS/BLUE SHIELD | Admitting: Obstetrics and Gynecology

## 2017-04-22 ENCOUNTER — Encounter: Payer: Self-pay | Admitting: Obstetrics and Gynecology

## 2017-04-22 ENCOUNTER — Other Ambulatory Visit (HOSPITAL_COMMUNITY)
Admission: RE | Admit: 2017-04-22 | Discharge: 2017-04-22 | Disposition: A | Discharge status: Home / Self Care | Payer: BLUE CROSS/BLUE SHIELD | Source: Ambulatory Visit | Attending: Obstetrics and Gynecology | Admitting: Obstetrics and Gynecology

## 2017-04-22 ENCOUNTER — Other Ambulatory Visit: Payer: Self-pay

## 2017-04-22 VITALS — BP 138/80 | HR 88 | Resp 16 | Ht 66.75 in | Wt 162.0 lb

## 2017-04-22 DIAGNOSIS — N951 Menopausal and female climacteric states: Secondary | ICD-10-CM | POA: Insufficient documentation

## 2017-04-22 DIAGNOSIS — Z01419 Encounter for gynecological examination (general) (routine) without abnormal findings: Secondary | ICD-10-CM

## 2017-04-22 MED ORDER — MEDROXYPROGESTERONE ACETATE 10 MG PO TABS: 10.0000 mg | ORAL_TABLET | Freq: Every day | ORAL | 0 refills | Status: DC

## 2017-04-22 NOTE — Progress Notes (Addendum)
57 y.o. G0P0000 Single Caucasian female here for annual exam.    Long hx of painful intercourse Not sexually active.  No menses from September to December.  Heavy cycle 03/01/17 for 4 - 5 days.  Then again 03/20/17 for 2 weeks bled again.  04/10/17 - 8 days of total bleeding.  Not a lot of pain other then the last episode of bleeding starting 04/10/17.  Menses were usually every 3 - 5 weeks prior to this.   Hot flashes for the last 1.5 years. Occurring at night only.   Prefers to avoid medication.   Feels tired.   Going on vacation next week.   ROS:  unscheduled bleeding/spotting, excessive bleeding, painful menses.  Muscle/joint pain.   UPT - negative.   Routine labs with PCP.   PCP:   Billey Gosling, MD  No LMP recorded. Patient is not currently having periods (Reason: Irregular Periods).           Sexually active: No.  The current method of family planning is abstinence.    Exercising: Yes.    walking, stretching, and gardening Smoker:  no  Health Maintenance: Pap:  03/18/14, Negative with neg HR HPV History of abnormal Pap:  no MMG:  02/23/16 BIRADS 1 negative/density b -- scheduled 05/04/17 TBC Colonoscopy:  05/25/13, Negative, repeat in 7 years TDaP:  12/16/15 HIV and Hep C: 05/31/12 negative Screening Labs:  PCP   reports that  has never smoked. she has never used smokeless tobacco. She reports that she drinks about 1.2 - 1.8 oz of alcohol per week. She reports that she does not use drugs.  Past Medical History:  Diagnosis Date  . Allergy   . Cyst    hip; probable epidemoid inclusion cyst; resolution with antibiotics  . Right knee meniscal tear  07/2011   Dr Hal Morales, Ortho; S/P cortisone injection  . Spinal stenosis at L4-L5 level 2017   bilateral  . Tear of left acetabular labrum 2006 ?   Dr Sallyanne Havers  . Vitamin D deficiency     Past Surgical History:  Procedure Laterality Date  . ANKLE SURGERY  1989    Post fracture   . DILATION AND CURETTAGE OF UTERUS   1988  . FOOT SURGERY     left- bone spurs, Dr Mayer Camel  . HAND SURGERY     Thumb surgery x 2  . KNEE ARTHROSCOPY Right 2013  . LAPAROTOMY  1988   For Dysmenorrhea    Current Outpatient Medications  Medication Sig Dispense Refill  . Cholecalciferol (VITAMIN D3) 2000 UNITS TABS Take by mouth daily.      . diclofenac sodium (VOLTAREN) 1 % GEL Apply 4 g topically 4 (four) times daily. 100 g 3  . fexofenadine (ALLEGRA) 180 MG tablet Take 180 mg by mouth as needed.     . meloxicam (MOBIC) 15 MG tablet Take 1 tablet (15 mg total) by mouth daily. 90 tablet 0  . Misc Natural Products (TART CHERRY ADVANCED PO) Take by mouth.    . triamcinolone (KENALOG) 0.1 % paste Use as directed 1 application in the mouth or throat 2 (two) times daily. 5 g 12  . TURMERIC PO Take by mouth daily.     No current facility-administered medications for this visit.     Family History  Problem Relation Age of Onset  . Parkinsonism Father   . Colon polyps Father        No colon cancer  . Transient ischemic attack Father   .  Breast cancer Mother 24  . Coronary artery disease Mother        ? radiation related  . Arthritis Mother        DJD  . Arthritis Maternal Grandmother        DJD  . Diabetes Maternal Grandfather   . Heart attack Neg Hx     ROS:  Pertinent items are noted in HPI.  Otherwise, a comprehensive ROS was negative.  Exam:   BP 138/80 (BP Location: Left Arm, Patient Position: Sitting, Cuff Size: Normal)   Pulse 88   Resp 16   Ht 5' 6.75" (1.695 m)   Wt 162 lb (73.5 kg)   BMI 25.56 kg/m     General appearance: alert, cooperative and appears stated age Head: Normocephalic, without obvious abnormality, atraumatic Neck: no adenopathy, supple, symmetrical, trachea midline and thyroid normal to inspection and palpation Lungs: clear to auscultation bilaterally Breasts: normal appearance, no masses or tenderness, No nipple retraction or dimpling, No nipple discharge or bleeding, No axillary or  supraclavicular adenopathy Heart: regular rate and rhythm Abdomen: soft, non-tender; no masses, no organomegaly Extremities: extremities normal, atraumatic, no cyanosis or edema Skin: Skin color, texture, turgor normal. No rashes or lesions Lymph nodes: Cervical, supraclavicular, and axillary nodes normal. No abnormal inguinal nodes palpated Neurologic: Grossly normal  Pelvic: External genitalia:  no lesions              Urethra:  normal appearing urethra with no masses, tenderness or lesions              Bartholins and Skenes: normal                 Vagina: normal appearing vagina with normal color and discharge, no lesions              Cervix: no lesions              Pap taken: Yes.   Bimanual Exam:  Uterus:  normal size, contour, position, consistency, mobility, non-tender              Adnexa: no mass, fullness, tenderness              Rectal exam: Yes.  .  Confirms.              Anus:  normal sphincter tone, no lesions  Chaperone was present for exam.  Assessment:   Well woman visit with normal exam. Menopausal symptoms.  Probable anovulatory bleeding.  FH post menopausal breast cancer.   Plan: Mammogram screening discussed. Recommended self breast awareness. Pap and HR HPV as above. Guidelines for Calcium, Vitamin D, regular exercise program including cardiovascular and weight bearing exercise. CBC, FSH, E2, and TSH. Rx for Provera 10 mg x 10 days.   Plan for SIS and EMB if abnormal bleeding persists.  Follow up annually and prn.     After visit summary provided.

## 2017-04-22 NOTE — Patient Instructions (Signed)
EXERCISE AND DIET:  We recommended that you start or continue a regular exercise program for good health. Regular exercise means any activity that makes your heart beat faster and makes you sweat.  We recommend exercising at least 30 minutes per day at least 3 days a week, preferably 4 or 5.  We also recommend a diet low in fat and sugar.  Inactivity, poor dietary choices and obesity can cause diabetes, heart attack, stroke, and kidney damage, among others.    ALCOHOL AND SMOKING:  Women should limit their alcohol intake to no more than 7 drinks/beers/glasses of wine (combined, not each!) per week. Moderation of alcohol intake to this level decreases your risk of breast cancer and liver damage. And of course, no recreational drugs are part of a healthy lifestyle.  And absolutely no smoking or even second hand smoke. Most people know smoking can cause heart and lung diseases, but did you know it also contributes to weakening of your bones? Aging of your skin?  Yellowing of your teeth and nails?  CALCIUM AND VITAMIN D:  Adequate intake of calcium and Vitamin D are recommended.  The recommendations for exact amounts of these supplements seem to change often, but generally speaking 600 mg of calcium (either carbonate or citrate) and 800 units of Vitamin D per day seems prudent. Certain women may benefit from higher intake of Vitamin D.  If you are among these women, your doctor will have told you during your visit.    PAP SMEARS:  Pap smears, to check for cervical cancer or precancers,  have traditionally been done yearly, although recent scientific advances have shown that most women can have pap smears less often.  However, every woman still should have a physical exam from her gynecologist every year. It will include a breast check, inspection of the vulva and vagina to check for abnormal growths or skin changes, a visual exam of the cervix, and then an exam to evaluate the size and shape of the uterus and  ovaries.  And after 56 years of age, a rectal exam is indicated to check for rectal cancers. We will also provide age appropriate advice regarding health maintenance, like when you should have certain vaccines, screening for sexually transmitted diseases, bone density testing, colonoscopy, mammograms, etc.   MAMMOGRAMS:  All women over 40 years old should have a yearly mammogram. Many facilities now offer a "3D" mammogram, which may cost around $50 extra out of pocket. If possible,  we recommend you accept the option to have the 3D mammogram performed.  It both reduces the number of women who will be called back for extra views which then turn out to be normal, and it is better than the routine mammogram at detecting truly abnormal areas.    COLONOSCOPY:  Colonoscopy to screen for colon cancer is recommended for all women at age 50.  We know, you hate the idea of the prep.  We agree, BUT, having colon cancer and not knowing it is worse!!  Colon cancer so often starts as a polyp that can be seen and removed at colonscopy, which can quite literally save your life!  And if your first colonoscopy is normal and you have no family history of colon cancer, most women don't have to have it again for 10 years.  Once every ten years, you can do something that may end up saving your life, right?  We will be happy to help you get it scheduled when you are ready.    Be sure to check your insurance coverage so you understand how much it will cost.  It may be covered as a preventative service at no cost, but you should check your particular policy.     Menopause and Herbal Products What is menopause? Menopause is the normal time of life when menstrual periods decrease in frequency and eventually stop completely. This process can take several years for some women. Menopause is complete when you have had an absence of menstruation for a full year since your last menstrual period. It usually occurs between the ages of 48 and  55. It is not common for menopause to begin before the age of 40. During menopause, your body stops producing the female hormones estrogen and progesterone. Common symptoms associated with this loss of hormones (vasomotor symptoms) are:  Hot flashes.  Hot flushes.  Night sweats.  Other common symptoms and complications of menopause include:  Decrease in sex drive.  Vaginal dryness and thinning of the walls of the vagina. This can make sex painful.  Dryness of the skin and development of wrinkles.  Headaches.  Tiredness.  Irritability.  Memory problems.  Weight gain.  Bladder infections.  Hair growth on the face and chest.  Inability to reproduce offspring (infertility).  Loss of density in the bones (osteoporosis) increasing your risk for breaks (fractures).  Depression.  Hardening and narrowing of the arteries (atherosclerosis). This increases your risk of heart attack and stroke.  What treatment options are available? There are many treatment choices for menopause symptoms. The most common treatment is hormone replacement therapy. Many alternative therapies for menopause are emerging, including the use of herbal products. These supplements can be found in the form of herbs, teas, oils, tinctures, and pills. Common herbal supplements for menopause are made from plants that contain phytoestrogens. Phytoestrogens are compounds that occur naturally in plants and plant products. They act like estrogen in the body. Foods and herbs that contain phytoestrogens include:  Soy.  Flax seeds.  Red clover.  Ginseng.  What menopause symptoms may be helped if I use herbal products?  Vasomotor symptoms. These may be helped by: ? Soy. Some studies show that soy may have a moderate benefit for hot flashes. ? Black cohosh. There is limited evidence indicating this may be beneficial for hot flashes.  Symptoms that are related to heart and blood vessel disease. These may be  helped by soy. Studies have shown that soy can help to lower cholesterol.  Depression. This may be helped by: ? St. John's wort. There is limited evidence that shows this may help mild to moderate depression. ? Black cohosh. There is evidence that this may help depression and mood swings.  Osteoporosis. Soy may help to decrease bone loss that is associated with menopause and may prevent osteoporosis. Limited evidence indicates that red clover may offer some bone loss protection as well. Other herbal products that are commonly used during menopause lack enough evidence to support their use as a replacement for conventional menopause therapies. These products include evening primrose, ginseng, and red clover. What are the cases when herbal products should not be used during menopause? Do not use herbal products during menopause without your health care provider's approval if:  You are taking medicine.  You have a preexisting liver condition.  Are there any risks in my taking herbal products during menopause? If you choose to use herbal products to help with symptoms of menopause, keep in mind that:  Different supplements have different and unmeasured   amounts of herbal ingredients.  Herbal products are not regulated the same way that medicines are.  Concentrations of herbs may vary depending on the way they are prepared. For example, the concentration may be different in a pill, tea, oil, and tincture.  Little is known about the risks of using herbal products, particularly the risks of long-term use.  Some herbal supplements can be harmful when combined with certain medicines.  Most commonly reported side effects of herbal products are mild. However, if used improperly, many herbal supplements can cause serious problems. Talk to your health care provider before starting any herbal product. If problems develop, stop taking the supplement and let your health care provider know. This  information is not intended to replace advice given to you by your health care provider. Make sure you discuss any questions you have with your health care provider. Document Released: 08/18/2007 Document Revised: 01/27/2016 Document Reviewed: 08/14/2013 Elsevier Interactive Patient Education  2017 [employer]  

## 2017-04-23 LAB — CBC
Hematocrit: 40.9 % (ref 34.0–46.6)
Hemoglobin: 13.4 g/dL (ref 11.1–15.9)
MCH: 31.5 pg (ref 26.6–33.0)
MCHC: 32.8 g/dL (ref 31.5–35.7)
MCV: 96 fL (ref 79–97)
PLATELETS: 315 10*3/uL (ref 150–379)
RBC: 4.26 x10E6/uL (ref 3.77–5.28)
RDW: 13.3 % (ref 12.3–15.4)
WBC: 6 10*3/uL (ref 3.4–10.8)

## 2017-04-23 LAB — ESTRADIOL: ESTRADIOL: 185.5 pg/mL

## 2017-04-23 LAB — FOLLICLE STIMULATING HORMONE: FSH: 27.9 m[IU]/mL

## 2017-04-23 LAB — TSH: TSH: 0.82 u[IU]/mL (ref 0.450–4.500)

## 2017-04-26 LAB — CYTOLOGY - PAP
Adequacy: ABSENT
Diagnosis: NEGATIVE
HPV: NOT DETECTED

## 2017-05-04 ENCOUNTER — Ambulatory Visit: Payer: BLUE CROSS/BLUE SHIELD

## 2017-05-13 ENCOUNTER — Ambulatory Visit
Admission: RE | Admit: 2017-05-13 | Discharge: 2017-05-13 | Disposition: A | Discharge status: Home / Self Care | Payer: BLUE CROSS/BLUE SHIELD | Source: Ambulatory Visit | Attending: Obstetrics and Gynecology | Admitting: Obstetrics and Gynecology

## 2017-05-13 DIAGNOSIS — Z139 Encounter for screening, unspecified: Secondary | ICD-10-CM

## 2017-07-11 ENCOUNTER — Encounter: Payer: Self-pay | Admitting: Family

## 2017-07-11 ENCOUNTER — Ambulatory Visit: Payer: BLUE CROSS/BLUE SHIELD | Admitting: Family

## 2017-07-11 VITALS — BP 128/82 | HR 90 | Temp 98.3°F | Wt 159.0 lb

## 2017-07-11 DIAGNOSIS — J209 Acute bronchitis, unspecified: Secondary | ICD-10-CM | POA: Diagnosis not present

## 2017-07-11 MED ORDER — AZITHROMYCIN 250 MG PO TABS: ORAL_TABLET | ORAL | 0 refills | Status: DC

## 2017-07-11 NOTE — Progress Notes (Signed)
Michele Bennett is a 57 y.o. female with the following history as recorded in [facility]:  Patient Active Problem List   Diagnosis Date Noted  . Primary osteoarthritis of both knees 12/09/2016  . Plantar fasciitis 12/09/2016  . Primary osteoarthritis of both feet 12/09/2016  . Primary osteoarthritis of both hands 12/09/2016  . DDD (degenerative disc disease), lumbar 12/09/2016  . Lateral epicondylitis, right elbow 12/09/2016  . Right ear pain 11/27/2016  . Acute pharyngitis 11/24/2016  . Oral ulcer 11/24/2016  . Lumbar radiculopathy 02/25/2016  . Spinal stenosis of lumbar region 02/20/2016  . DDD (degenerative disc disease), cervical 10/19/2012  . PVCs (premature ventricular contractions) 03/12/2011  . Vitamin D deficiency 01/01/2008  . Dysmenorrhea 01/01/2008  . DJD (degenerative joint disease), multiple sites 01/01/2008    Current Outpatient Medications  Medication Sig Dispense Refill  . Cholecalciferol (VITAMIN D3) 2000 UNITS TABS Take by mouth daily.      . diclofenac sodium (VOLTAREN) 1 % GEL Apply 4 g topically 4 (four) times daily. 100 g 3  . fexofenadine (ALLEGRA) 180 MG tablet Take 180 mg by mouth as needed.     . meloxicam (MOBIC) 15 MG tablet Take 1 tablet (15 mg total) by mouth daily. 90 tablet 0  . Misc Natural Products (TART CHERRY ADVANCED PO) Take by mouth.    . TURMERIC PO Take by mouth daily.    Marland Kitchen azithromycin (ZITHROMAX) 250 MG tablet 2 tabs po qd x 1 day; 1 tablet per day x 4 days; 6 tablet 0   No current facility-administered medications for this visit.     Allergies: Patient has no known allergies.  Past Medical History:  Diagnosis Date  . Allergy   . Cyst    hip; probable epidemoid inclusion cyst; resolution with antibiotics  . Right knee meniscal tear  07/2011   Dr Hal Morales, Ortho; S/P cortisone injection  . Spinal stenosis at L4-L5 level 2017   bilateral  . Tear of left acetabular labrum 2006 ?   Dr Sallyanne Havers  . Vitamin D deficiency     Past  Surgical History:  Procedure Laterality Date  . ANKLE SURGERY  1989    Post fracture   . DILATION AND CURETTAGE OF UTERUS  1988  . FOOT SURGERY     left- bone spurs, Dr Mayer Camel  . HAND SURGERY     Thumb surgery x 2  . KNEE ARTHROSCOPY Right 2013  . LAPAROTOMY  1988   For Dysmenorrhea    Family History  Problem Relation Age of Onset  . Parkinsonism Father   . Colon polyps Father        No colon cancer  . Transient ischemic attack Father   . Breast cancer Mother 47  . Coronary artery disease Mother        ? radiation related  . Arthritis Mother        DJD  . Arthritis Maternal Grandmother        DJD  . Diabetes Maternal Grandfather   . Heart attack Neg Hx     Social History   Tobacco Use  . Smoking status: Never Smoker  . Smokeless tobacco: Never Used  Substance Use Topics  . Alcohol use: Yes    Alcohol/week: 1.2 - 1.8 oz    Types: 2 - 3 Glasses of wine per week    Subjective:  Patient presents with concerns for head/ chest congestion; feels like congestion moving into chest; + prone to bronchitis; does not typically need  inhaler; cannot tolerate nasal sprays; pain in her right ear;   Objective:  Vitals:   07/11/17 0835  BP: 128/82  Pulse: 90  Temp: 98.3 F (36.8 C)  SpO2: 98%  Weight: 159 lb (72.1 kg)    General: Well developed, well nourished, in no acute distress  Skin : Warm and dry.  Head: Normocephalic and atraumatic  Eyes: Sclera and conjunctiva clear; pupils round and reactive to light; extraocular movements intact  Ears: External normal; canals clear; tympanic membranes normal  Oropharynx: Pink, supple. No suspicious lesions  Neck: Supple without thyromegaly, adenopathy  Lungs: Respirations unlabored; clear to auscultation bilaterally without wheeze, rales, rhonchi  CVS exam: normal rate and regular rhythm.  Neurologic: Alert and oriented; speech intact; face symmetrical; moves all extremities well; CNII-XII intact without focal deficit  Assessment:   1. Acute bronchitis, unspecified organism     Plan:  Suspect allergic component; continue Allegra; Rx for Z-pak #1 take as directed; increase fluids, rest and follow-up worse, no better.   No follow-ups on file.  No orders of the defined types were placed in this encounter.   Requested Prescriptions   Signed Prescriptions Disp Refills  . azithromycin (ZITHROMAX) 250 MG tablet 6 tablet 0    Sig: 2 tabs po qd x 1 day; 1 tablet per day x 4 days;

## 2017-07-19 NOTE — Progress Notes (Signed)
Subjective:    Patient ID: Michele Bennett, female    DOB: 08/13/1960, 57 y.o.   MRN: [id number]  HPI She is here for a physical exam.   She has been feeling fatigued.  She thinks it is likely hormonal in nature.  Her sleep is not good due to menopause.   She has bad osteoarthritis.  She sees rheumatology.  Cramping in posterior right neck.  It is new and she is not concerned about it.     Medications and allergies reviewed with patient and updated if appropriate.  Patient Active Problem List   Diagnosis Date Noted  . Primary osteoarthritis of both knees 12/09/2016  . Plantar fasciitis 12/09/2016  . Primary osteoarthritis of both feet 12/09/2016  . Primary osteoarthritis of both hands 12/09/2016  . DDD (degenerative disc disease), lumbar 12/09/2016  . Lateral epicondylitis, right elbow 12/09/2016  . Lumbar radiculopathy 02/25/2016  . Spinal stenosis of lumbar region 02/20/2016  . DDD (degenerative disc disease), cervical 10/19/2012  . PVCs (premature ventricular contractions) 03/12/2011  . Vitamin D deficiency 01/01/2008  . DJD (degenerative joint disease), multiple sites 01/01/2008    Current Outpatient Medications on File Prior to Visit  Medication Sig Dispense Refill  . Cholecalciferol (VITAMIN D3) 2000 UNITS TABS Take by mouth daily.      . diclofenac sodium (VOLTAREN) 1 % GEL Apply 4 g topically 4 (four) times daily. 100 g 3  . fexofenadine (ALLEGRA) 180 MG tablet Take 180 mg by mouth as needed.     . Misc Natural Products (TART CHERRY ADVANCED PO) Take by mouth.    . TURMERIC PO Take by mouth daily.     No current facility-administered medications on file prior to visit.     Past Medical History:  Diagnosis Date  . Allergy   . Cyst    hip; probable epidemoid inclusion cyst; resolution with antibiotics  . Right knee meniscal tear  07/2011   Dr Hal Morales, Ortho; S/P cortisone injection  . Spinal stenosis at L4-L5 level 2017   bilateral  . Tear of left  acetabular labrum 2006 ?   Dr Sallyanne Havers  . Vitamin D deficiency     Past Surgical History:  Procedure Laterality Date  . ANKLE SURGERY  1989    Post fracture   . DILATION AND CURETTAGE OF UTERUS  1988  . FOOT SURGERY     left- bone spurs, Dr Mayer Camel  . HAND SURGERY     Thumb surgery x 2  . KNEE ARTHROSCOPY Right 2013  . LAPAROTOMY  1988   For Dysmenorrhea    Social History   Socioeconomic History  . Marital status: Single    Spouse name: Not on file  . Number of children: 0  . Years of education: Not on file  . Highest education level: Not on file  Occupational History  . Not on file  Social Needs  . Financial resource strain: Not on file  . Food insecurity:    Worry: Not on file    Inability: Not on file  . Transportation needs:    Medical: Not on file    Non-medical: Not on file  Tobacco Use  . Smoking status: Never Smoker  . Smokeless tobacco: Never Used  Substance and Sexual Activity  . Alcohol use: Yes    Alcohol/week: 1.2 - 1.8 oz    Types: 2 - 3 Glasses of wine per week    Comment: social  . Drug use: No  .  Sexual activity: Not Currently    Partners: Male  Lifestyle  . Physical activity:    Days per week: Not on file    Minutes per session: Not on file  . Stress: Not on file  Relationships  . Social connections:    Talks on phone: Not on file    Gets together: Not on file    Attends religious service: Not on file    Active member of club or organization: Not on file    Attends meetings of clubs or organizations: Not on file    Relationship status: Not on file  Other Topics Concern  . 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  Walks dog, gardening, PT for back    Family History  Problem Relation Age of Onset  . Parkinsonism Father   . Colon polyps Father        No colon cancer  . Transient ischemic attack Father   . Breast cancer Mother 96  . Coronary artery disease Mother        ? radiation related  .  Arthritis Mother        DJD  . Arthritis Maternal Grandmother        DJD  . Diabetes Maternal Grandfather   . Heart attack Neg Hx     Review of Systems  Constitutional: Negative for chills and fever.  HENT: Positive for voice change.   Eyes: Negative for visual disturbance.  Respiratory: Positive for cough (residual - dry). Negative for chest tightness, shortness of breath and wheezing.   Cardiovascular: Negative for chest pain, palpitations and leg swelling.  Gastrointestinal: Negative for abdominal pain, blood in stool, constipation, diarrhea and nausea.       No gerd  Genitourinary: Negative for dysuria and hematuria.  Musculoskeletal: Positive for arthralgias and back pain.  Skin: Negative for color change and rash.  Neurological: Positive for headaches (occ posterior - tension). Negative for dizziness and light-headedness.  Psychiatric/Behavioral: Positive for sleep disturbance (hormonal). Negative for dysphoric mood. The patient is not nervous/anxious.        Objective:   Vitals:   07/20/17 0918  BP: 116/74  Pulse: 63  Resp: 16  Temp: 97.9 F (36.6 C)  SpO2: 98%   Filed Weights   07/20/17 0918  Weight: 160 lb (72.6 kg)   Body mass index is 25.25 kg/m.  Wt Readings from Last 3 Encounters:  07/20/17 160 lb (72.6 kg)  07/11/17 159 lb (72.1 kg)  04/22/17 162 lb (73.5 kg)     Physical Exam Constitutional: She appears well-developed and well-nourished. No distress.  HENT:  Head: Normocephalic and atraumatic.  Right Ear: External ear normal. Normal ear canal and TM Left Ear: External ear normal.  Normal ear canal and TM Mouth/Throat: Oropharynx is clear and moist.  Eyes: Conjunctivae and EOM are normal.  Neck: Neck supple. No tracheal deviation present. No thyromegaly present.  No carotid bruit  Cardiovascular: Normal rate, regular rhythm and normal heart sounds.   No murmur heard.  No edema. Pulmonary/Chest: Effort normal and breath sounds normal. No  respiratory distress. She has no wheezes. She has no rales.  Breast: deferred to Gyn Abdominal: Soft. She exhibits no distension. There is no tenderness.  Lymphadenopathy: She has no cervical adenopathy.  Skin: Skin is warm and dry. She is not diaphoretic.  Psychiatric: She has a normal mood and affect. Her behavior is normal.        Assessment &  Plan:   Physical exam: Screening blood work   ordered Immunizations  Discussed shingrix, others up to date Colonoscopy    Up to date  Mammogram    Up to date  Gyn    Up to date  Eye exams    Up to date  EKG      Last done 10/2012 Exercise   Gardening, walking dog Weight  Good BMI Skin  no concerns - sees derm every other year Substance abuse   none  See Problem List for Assessment and Plan of chronic medical problems.   FU in one year

## 2017-07-19 NOTE — Patient Instructions (Addendum)
Test(s) ordered today. Your results will be released to MyChart (or called to you) after review, usually within 72hours after test completion. If any changes need to be made, you will be notified at that same time.  All other Health Maintenance issues reviewed.   All recommended immunizations and age-appropriate screenings are up-to-date or discussed.  No immunizations administered today.   Medications reviewed and updated.  No changes recommended at this time.    Please followup in one year   Health Maintenance, Female Adopting a healthy lifestyle and getting preventive care can go a long way to promote health and wellness. Talk with your health care provider about what schedule of regular examinations is right for you. This is a good chance for you to check in with your provider about disease prevention and staying healthy. In between checkups, there are plenty of things you can do on your own. Experts have done a lot of research about which lifestyle changes and preventive measures are most likely to keep you healthy. Ask your health care provider for more information. Weight and diet Eat a healthy diet  Be sure to include plenty of vegetables, fruits, low-fat dairy products, and lean protein.  Do not eat a lot of foods high in solid fats, added sugars, or salt.  Get regular exercise. This is one of the most important things you can do for your health. ? Most adults should exercise for at least 150 minutes each week. The exercise should increase your heart rate and make you sweat (moderate-intensity exercise). ? Most adults should also do strengthening exercises at least twice a week. This is in addition to the moderate-intensity exercise.  Maintain a healthy weight  Body mass index (BMI) is a measurement that can be used to identify possible weight problems. It estimates body fat based on height and weight. Your health care provider can help determine your BMI and help you achieve  or maintain a healthy weight.  For females 20 years of age and older: ? A BMI below 18.5 is considered underweight. ? A BMI of 18.5 to 24.9 is normal. ? A BMI of 25 to 29.9 is considered overweight. ? A BMI of 30 and above is considered obese.  Watch levels of cholesterol and blood lipids  You should start having your blood tested for lipids and cholesterol at 56 years of age, then have this test every 5 years.  You may need to have your cholesterol levels checked more often if: ? Your lipid or cholesterol levels are high. ? You are older than 56 years of age. ? You are at high risk for heart disease.  Cancer screening Lung Cancer  Lung cancer screening is recommended for adults 55-80 years old who are at high risk for lung cancer because of a history of smoking.  A yearly low-dose CT scan of the lungs is recommended for people who: ? Currently smoke. ? Have quit within the past 15 years. ? Have at least a 30-pack-year history of smoking. A pack year is smoking an average of one pack of cigarettes a day for 1 year.  Yearly screening should continue until it has been 15 years since you quit.  Yearly screening should stop if you develop a health problem that would prevent you from having lung cancer treatment.  Breast Cancer  Practice breast self-awareness. This means understanding how your breasts normally appear and feel.  It also means doing regular breast self-exams. Let your health care provider know about any   changes, no matter how small.  If you are in your 20s or 30s, you should have a clinical breast exam (CBE) by a health care provider every 1-3 years as part of a regular health exam.  If you are 40 or older, have a CBE every year. Also consider having a breast X-ray (mammogram) every year.  If you have a family history of breast cancer, talk to your health care provider about genetic screening.  If you are at high risk for breast cancer, talk to your health care  provider about having an MRI and a mammogram every year.  Breast cancer gene (BRCA) assessment is recommended for women who have family members with BRCA-related cancers. BRCA-related cancers include: ? Breast. ? Ovarian. ? Tubal. ? Peritoneal cancers.  Results of the assessment will determine the need for genetic counseling and BRCA1 and BRCA2 testing.  Cervical Cancer Your health care provider may recommend that you be screened regularly for cancer of the pelvic organs (ovaries, uterus, and vagina). This screening involves a pelvic examination, including checking for microscopic changes to the surface of your cervix (Pap test). You may be encouraged to have this screening done every 3 years, beginning at age 21.  For women ages 30-65, health care providers may recommend pelvic exams and Pap testing every 3 years, or they may recommend the Pap and pelvic exam, combined with testing for human papilloma virus (HPV), every 5 years. Some types of HPV increase your risk of cervical cancer. Testing for HPV may also be done on women of any age with unclear Pap test results.  Other health care providers may not recommend any screening for nonpregnant women who are considered low risk for pelvic cancer and who do not have symptoms. Ask your health care provider if a screening pelvic exam is right for you.  If you have had past treatment for cervical cancer or a condition that could lead to cancer, you need Pap tests and screening for cancer for at least 20 years after your treatment. If Pap tests have been discontinued, your risk factors (such as having a new sexual partner) need to be reassessed to determine if screening should resume. Some women have medical problems that increase the chance of getting cervical cancer. In these cases, your health care provider may recommend more frequent screening and Pap tests.  Colorectal Cancer  This type of cancer can be detected and often prevented.  Routine  colorectal cancer screening usually begins at 56 years of age and continues through 56 years of age.  Your health care provider may recommend screening at an earlier age if you have risk factors for colon cancer.  Your health care provider may also recommend using home test kits to check for hidden blood in the stool.  A small camera at the end of a tube can be used to examine your colon directly (sigmoidoscopy or colonoscopy). This is done to check for the earliest forms of colorectal cancer.  Routine screening usually begins at age 50.  Direct examination of the colon should be repeated every 5-10 years through 56 years of age. However, you may need to be screened more often if early forms of precancerous polyps or small growths are found.  Skin Cancer  Check your skin from head to toe regularly.  Tell your health care provider about any new moles or changes in moles, especially if there is a change in a mole's shape or color.  Also tell your health care provider if   you have a mole that is larger than the size of a pencil eraser.  Always use sunscreen. Apply sunscreen liberally and repeatedly throughout the day.  Protect yourself by wearing long sleeves, pants, a wide-brimmed hat, and sunglasses whenever you are outside.  Heart disease, diabetes, and high blood pressure  High blood pressure causes heart disease and increases the risk of stroke. High blood pressure is more likely to develop in: ? People who have blood pressure in the high end of the normal range (130-139/85-89 mm Hg). ? People who are overweight or obese. ? People who are African American.  If you are 18-39 years of age, have your blood pressure checked every 3-5 years. If you are 40 years of age or older, have your blood pressure checked every year. You should have your blood pressure measured twice-once when you are at a hospital or clinic, and once when you are not at a hospital or clinic. Record the average of the  two measurements. To check your blood pressure when you are not at a hospital or clinic, you can use: ? An automated blood pressure machine at a pharmacy. ? A home blood pressure monitor.  If you are between 55 years and 79 years old, ask your health care provider if you should take aspirin to prevent strokes.  Have regular diabetes screenings. This involves taking a blood sample to check your fasting blood sugar level. ? If you are at a normal weight and have a low risk for diabetes, have this test once every three years after 56 years of age. ? If you are overweight and have a high risk for diabetes, consider being tested at a younger age or more often. Preventing infection Hepatitis B  If you have a higher risk for hepatitis B, you should be screened for this virus. You are considered at high risk for hepatitis B if: ? You were born in a country where hepatitis B is common. Ask your health care provider which countries are considered high risk. ? Your parents were born in a high-risk country, and you have not been immunized against hepatitis B (hepatitis B vaccine). ? You have HIV or AIDS. ? You use needles to inject street drugs. ? You live with someone who has hepatitis B. ? You have had sex with someone who has hepatitis B. ? You get hemodialysis treatment. ? You take certain medicines for conditions, including cancer, organ transplantation, and autoimmune conditions.  Hepatitis C  Blood testing is recommended for: ? Everyone born from 1945 through 1965. ? Anyone with known risk factors for hepatitis C.  Sexually transmitted infections (STIs)  You should be screened for sexually transmitted infections (STIs) including gonorrhea and chlamydia if: ? You are sexually active and are younger than 56 years of age. ? You are older than 56 years of age and your health care provider tells you that you are at risk for this type of infection. ? Your sexual activity has changed since you  were last screened and you are at an increased risk for chlamydia or gonorrhea. Ask your health care provider if you are at risk.  If you do not have HIV, but are at risk, it may be recommended that you take a prescription medicine daily to prevent HIV infection. This is called pre-exposure prophylaxis (PrEP). You are considered at risk if: ? You are sexually active and do not regularly use condoms or know the HIV status of your partner(s). ? You take drugs by   You are sexually active with a partner who has HIV.  Talk with your health care provider about whether you are at high risk of being infected with HIV. If you choose to begin PrEP, you should first be tested for HIV. You should then be tested every 3 months for as long as you are taking PrEP. Pregnancy  If you are premenopausal and you may become pregnant, ask your health care provider about preconception counseling.  If you may become pregnant, take 400 to 800 micrograms (mcg) of folic acid every day.  If you want to prevent pregnancy, talk to your health care provider about birth control (contraception). Osteoporosis and menopause  Osteoporosis is a disease in which the bones lose minerals and strength with aging. This can result in serious bone fractures. Your risk for osteoporosis can be identified using a bone density scan.  If you are 65 years of age or older, or if you are at risk for osteoporosis and fractures, ask your health care provider if you should be screened.  Ask your health care provider whether you should take a calcium or vitamin D supplement to lower your risk for osteoporosis.  Menopause may have certain physical symptoms and risks.  Hormone replacement therapy may reduce some of these symptoms and risks. Talk to your health care provider about whether hormone replacement therapy is right for you. Follow these instructions at home:  Schedule regular health, dental, and eye exams.  Stay current  with your immunizations.  Do not use any tobacco products including cigarettes, chewing tobacco, or electronic cigarettes.  If you are pregnant, do not drink alcohol.  If you are breastfeeding, limit how much and how often you drink alcohol.  Limit alcohol intake to no more than 1 drink per day for nonpregnant women. One drink equals 12 ounces of beer, 5 ounces of wine, or 1 ounces of hard liquor.  Do not use street drugs.  Do not share needles.  Ask your health care provider for help if you need support or information about quitting drugs.  Tell your health care provider if you often feel depressed.  Tell your health care provider if you have ever been abused or do not feel safe at home. This information is not intended to replace advice given to you by your health care provider. Make sure you discuss any questions you have with your health care provider. Document Released: 09/14/2010 Document Revised: 08/07/2015 Document Reviewed: 12/03/2014 Elsevier Interactive Patient Education  2018 [employer]  

## 2017-07-20 ENCOUNTER — Ambulatory Visit (INDEPENDENT_AMBULATORY_CARE_PROVIDER_SITE_OTHER): Payer: BLUE CROSS/BLUE SHIELD | Admitting: Internal Medicine

## 2017-07-20 ENCOUNTER — Encounter: Payer: Self-pay | Admitting: Podiatry

## 2017-07-20 ENCOUNTER — Encounter: Payer: Self-pay | Admitting: Internal Medicine

## 2017-07-20 ENCOUNTER — Ambulatory Visit (INDEPENDENT_AMBULATORY_CARE_PROVIDER_SITE_OTHER): Payer: BLUE CROSS/BLUE SHIELD | Admitting: Podiatry

## 2017-07-20 VITALS — BP 116/74 | HR 63 | Temp 97.9°F | Resp 16 | Ht 66.75 in | Wt 160.0 lb

## 2017-07-20 DIAGNOSIS — L603 Nail dystrophy: Secondary | ICD-10-CM

## 2017-07-20 DIAGNOSIS — E559 Vitamin D deficiency, unspecified: Secondary | ICD-10-CM | POA: Diagnosis not present

## 2017-07-20 DIAGNOSIS — Z0000 Encounter for general adult medical examination without abnormal findings: Secondary | ICD-10-CM

## 2017-07-20 NOTE — Assessment & Plan Note (Signed)
Taking vitamin d 2000 units daily Check level 

## 2017-07-25 NOTE — Progress Notes (Signed)
   Subjective: 57 year old female presenting today with a chief complaint of a partially detached left great toenail that became symptomatic one week ago. She states she dropped a can on the toe three months ago in February and the nail turned black at that time. She states the nail is now lifting up and is about to come off. She has not done anything to treat the symptoms. Touching the nail increases the pain. Patient is here for further evaluation and treatment.   Past Medical History:  Diagnosis Date  . Allergy   . Cyst    hip; probable epidemoid inclusion cyst; resolution with antibiotics  . Right knee meniscal tear  07/2011   Dr Hal Morales, Ortho; S/P cortisone injection  . Spinal stenosis at L4-L5 level 2017   bilateral  . Tear of left acetabular labrum 2006 ?   Dr Sallyanne Havers  . Vitamin D deficiency     Objective:  General: Well developed, nourished, in no acute distress, alert and oriented x3   Dermatology: Hyperkeratotic, discolored, thickened, onychodystrophy of the left great toenail. Skin is warm, dry and supple bilateral lower extremities. Negative for open lesions or macerations.  Vascular: Dorsalis Pedis artery and Posterior Tibial artery pedal pulses palpable. No lower extremity edema noted.   Neruologic: Grossly intact via light touch bilateral.  Musculoskeletal: Muscular strength within normal limits in all groups bilateral. Normal range of motion noted to all pedal and ankle joints.   Assessment:  #1 Partially detached, dystrophic nail secondary to trauma left hallux  Plan of Care:  1. Patient evaluated.  2. Discussed treatment alternatives and plan of care. Explained nail avulsion procedure and post procedure course to patient. 3. Patient opted for total temporary nail avulsion.  4. Prior to procedure, local anesthesia infiltration utilized using 3 ml of a 50:50 mixture of 2% plain lidocaine and 0.5% plain marcaine in a normal hallux block fashion and a betadine prep  performed.  5. Light dressing applied. 6. Return to clinic as needed.   Edrick Kins, DPM [facility]  Dr. Edrick Kins, [address]                                        [place], [place] [address]                Office [phone]  Fax [phone]

## 2017-07-28 ENCOUNTER — Other Ambulatory Visit (INDEPENDENT_AMBULATORY_CARE_PROVIDER_SITE_OTHER): Payer: BLUE CROSS/BLUE SHIELD

## 2017-07-28 DIAGNOSIS — Z0000 Encounter for general adult medical examination without abnormal findings: Secondary | ICD-10-CM

## 2017-07-28 DIAGNOSIS — E559 Vitamin D deficiency, unspecified: Secondary | ICD-10-CM

## 2017-07-28 LAB — COMPREHENSIVE METABOLIC PANEL
ALT: 10 U/L (ref 0–35)
AST: 12 U/L (ref 0–37)
Albumin: 3.9 g/dL (ref 3.5–5.2)
Alkaline Phosphatase: 56 U/L (ref 39–117)
BUN: 16 mg/dL (ref 6–23)
CHLORIDE: 105 meq/L (ref 96–112)
CO2: 28 meq/L (ref 19–32)
CREATININE: 0.89 mg/dL (ref 0.40–1.20)
Calcium: 9.3 mg/dL (ref 8.4–10.5)
GFR: 69.82 mL/min (ref >60.00)
GLUCOSE: 91 mg/dL (ref 70–99)
Potassium: 4.3 mEq/L (ref 3.5–5.1)
Sodium: 140 mEq/L (ref 135–145)
Total Bilirubin: 0.6 mg/dL (ref 0.2–1.2)
Total Protein: 6.7 g/dL (ref 6.0–8.3)

## 2017-07-28 LAB — LIPID PANEL
CHOL/HDL RATIO: 3
Cholesterol: 161 mg/dL (ref 0–200)
HDL: 59.7 mg/dL (ref >39.00)
LDL CALC: 89 mg/dL (ref 0–99)
NonHDL: 100.99
Triglycerides: 61 mg/dL (ref 0.0–149.0)
VLDL: 12.2 mg/dL (ref 0.0–40.0)

## 2017-07-28 LAB — VITAMIN D 25 HYDROXY (VIT D DEFICIENCY, FRACTURES): VITD: 41.07 ng/mL (ref 30.00–100.00)

## 2017-07-29 ENCOUNTER — Encounter: Payer: Self-pay | Admitting: Internal Medicine

## 2017-08-29 ENCOUNTER — Ambulatory Visit: Payer: BLUE CROSS/BLUE SHIELD | Admitting: Podiatry

## 2017-08-29 DIAGNOSIS — L603 Nail dystrophy: Secondary | ICD-10-CM | POA: Diagnosis not present

## 2017-09-04 NOTE — Progress Notes (Signed)
   HPI: 57 year old female presenting today for follow up evaluation after having her left great toenail removed on 07/20/17. She states she is doing well overall. She reports some intermittent drainage from the toe. She is concerned that the nail is growing into the skin of the toe. Patient is here for further evaluation and treatment.   Past Medical History:  Diagnosis Date  . Allergy   . Cyst    hip; probable epidemoid inclusion cyst; resolution with antibiotics  . Right knee meniscal tear  07/2011   Dr Hal Morales, Ortho; S/P cortisone injection  . Spinal stenosis at L4-L5 level 2017   bilateral  . Tear of left acetabular labrum 2006 ?   Dr Sallyanne Havers  . Vitamin D deficiency      Physical Exam: General: The patient is alert and oriented x3 in no acute distress.  Dermatology: Growth of new nail to the left hallux noted and is within normal limits. Skin is warm, dry and supple bilateral lower extremities. Negative for open lesions or macerations.  Vascular: Palpable pedal pulses bilaterally. No edema or erythema noted. Capillary refill within normal limits.  Neurological: Epicritic and protective threshold grossly intact bilaterally.   Musculoskeletal Exam: Range of motion within normal limits to all pedal and ankle joints bilateral. Muscle strength 5/5 in all groups bilateral.   Assessment: 1. Growth of new nail left hallux    Plan of Care:  1. Patient evaluated. 2. Recommended good foot hygiene.  3. Debridement of subungual debris performed.  4. Return to clinic as needed.      Edrick Kins, DPM [facility]  Dr. Edrick Kins, DPM    [address], [place] [address]                Office [phone]  Fax [phone]

## 2017-09-07 ENCOUNTER — Telehealth (INDEPENDENT_AMBULATORY_CARE_PROVIDER_SITE_OTHER): Payer: Self-pay | Admitting: Specialist

## 2017-09-07 NOTE — Telephone Encounter (Signed)
Patient is going to be requesting medical records from Dr. Louanne Skye, she said she had brought a MRI disc and that he kept it. Is this something she can get from [place] or does she need to request from [facility]? Please advise # [phone]

## 2017-09-08 NOTE — Telephone Encounter (Signed)
This is the patient that I brought the CD to you for.

## 2017-09-09 NOTE — Telephone Encounter (Signed)
IC patient and advised the outside CD is ready for her to pickup as well a copy of records

## 2017-10-12 ENCOUNTER — Encounter

## 2017-12-31 ENCOUNTER — Ambulatory Visit: Payer: BLUE CROSS/BLUE SHIELD | Admitting: Family Medicine

## 2017-12-31 ENCOUNTER — Encounter: Payer: Self-pay | Admitting: Family Medicine

## 2017-12-31 DIAGNOSIS — J40 Bronchitis, not specified as acute or chronic: Secondary | ICD-10-CM | POA: Diagnosis not present

## 2017-12-31 MED ORDER — AZITHROMYCIN 250 MG PO TABS: ORAL_TABLET | ORAL | 0 refills | Status: DC

## 2017-12-31 NOTE — Progress Notes (Signed)
  Tommi Rumps, MD Phone: [phone]  Michele Bennett is a 57 y.o. female who presents today for f/u.  CC: bronchitis  Patient notes 4 days of sinus congestion with green mucus out of her nose.  She has subsequently developed chest congestion with cough productive of green mucus.  She had some chills.  Did have possible fever on Thursday.  Some right ear discomfort.  Some sore throat.  No shortness of breath.  She has tried Mucinex DM and Zicam.  Typically requires a course of azithromycin to treat bronchitis.  Social History   Tobacco Use  Smoking Status Never Smoker  Smokeless Tobacco Never Used     ROS see history of present illness  Objective  Physical Exam Vitals:   12/31/17 1117  BP: 122/82  Pulse: 79  Temp: 98.1 F (36.7 C)  SpO2: 98%    BP Readings from Last 3 Encounters:  12/31/17 122/82  07/20/17 116/74  07/11/17 128/82   Wt Readings from Last 3 Encounters:  12/31/17 163 lb 4 oz (74 kg)  07/20/17 160 lb (72.6 kg)  07/11/17 159 lb (72.1 kg)    Physical Exam  Constitutional: No distress.  HENT:  Head: Normocephalic and atraumatic.  Mouth/Throat: Oropharynx is clear and moist. No oropharyngeal exudate.  Normal TMs bilaterally  Eyes: Pupils are equal, round, and reactive to light. Conjunctivae are normal.  Neck: Neck supple.  Cardiovascular: Normal rate, regular rhythm and normal heart sounds.  Pulmonary/Chest: Effort normal.  Mildly coarse breath sounds bilaterally, no wheezes or crackles  Musculoskeletal: She exhibits no edema.  Lymphadenopathy:    She has no cervical adenopathy.  Neurological: She is alert.  Skin: Skin is warm and dry. She is not diaphoretic.     Assessment/Plan: Please see individual problem list.  Bronchitis Symptoms most consistent with bronchitis.  Possible sinusitis.  In the past she has responded well to azithromycin.  Prescription for this given.  Given return precautions.   No orders of the defined types  were placed in this encounter.   Meds ordered this encounter  Medications  . azithromycin (ZITHROMAX) 250 MG tablet    Sig: Please take 500 mg (2 tablets) by mouth today, then take 250 mg (1 tablet) by mouth on days 2 through 5    Dispense:  6 tablet    Refill:  0     Tommi Rumps, MD [place]

## 2017-12-31 NOTE — Assessment & Plan Note (Signed)
Symptoms most consistent with bronchitis.  Possible sinusitis.  In the past she has responded well to azithromycin.  Prescription for this given.  Given return precautions.

## 2017-12-31 NOTE — Patient Instructions (Signed)
Nice to see you. We will treat you with azithromycin. If your symptoms worsen please be reevaluated. If you develop shortness of breath, cough productive of blood, or fevers please go to the emergency room.

## 2018-01-03 ENCOUNTER — Ambulatory Visit: Payer: BLUE CROSS/BLUE SHIELD

## 2018-01-10 ENCOUNTER — Ambulatory Visit (INDEPENDENT_AMBULATORY_CARE_PROVIDER_SITE_OTHER): Payer: BLUE CROSS/BLUE SHIELD

## 2018-01-10 DIAGNOSIS — Z23 Encounter for immunization: Secondary | ICD-10-CM | POA: Diagnosis not present

## 2018-01-19 ENCOUNTER — Telehealth: Payer: Self-pay | Admitting: Obstetrics and Gynecology

## 2018-01-19 NOTE — Telephone Encounter (Signed)
Patient thinks she has a yeast infection and would like an appointment today. To triage to assist with scheduling with Dr.Silva. She did state she would she any provider today.

## 2018-01-19 NOTE — Telephone Encounter (Signed)
Spoke with patient. Patient reports green to yellow vaginal d/c with foul odor. Took abx 3 wks ago, followed by 10 days of "natural progesterone to bring on menses". No menses, noticed some brown d/c. Has not had a cycle since July 2019. Not SA, abstinence for contraceptive.   Denies pain or any other GYN symptoms. Recommended OV for further evaluation. OV scheduled with Dr. Quincy Simmonds on 11/8 at [place] to provider for final review. Patient is agreeable to disposition. Will close encounter.

## 2018-01-19 NOTE — Progress Notes (Signed)
GYNECOLOGY  VISIT   HPI: 57 y.o.   Single  Caucasian  female   [id number] with Patient's last menstrual period was 09/20/2017 (exact date).   here for vaginal discharge with odor.   Notices yellow green discharge.   Just finished Zithromax for URI in October.   Took some natural progesterone. This was a pill from a health food store.   Last menses in July and had some spotting off and on since July.  Occurring every 2 -3 days to every 6 weeks.  Perimenopausal by prior labs.   Took a course of Provera in February.   No hot flashes.   Not sleeping well.  No night sweats.   Not sexually active for 8 years.   GYNECOLOGIC HISTORY: Patient's last menstrual period was 09/20/2017 (exact date). Contraception:  Abstinence Menopausal hormone therapy: none Last mammogram: 05-16-17 Neg/density B/BiRads1 Last pap smear: 04-22-17 Neg:Neg HR HPV, 03-18-14 Neg:Neg HR HPV        OB History    Gravida  0   Para  0   Term  0   Preterm  0   AB  0   Living  0     SAB  0   TAB  0   Ectopic  0   Multiple  0   Live Births  0              Patient Active Problem List   Diagnosis Date Noted  . Bronchitis 12/31/2017  . Primary osteoarthritis of both knees 12/09/2016  . Plantar fasciitis 12/09/2016  . Primary osteoarthritis of both feet 12/09/2016  . Primary osteoarthritis of both hands 12/09/2016  . DDD (degenerative disc disease), lumbar 12/09/2016  . Lateral epicondylitis, right elbow 12/09/2016  . Lumbar radiculopathy 02/25/2016  . Spinal stenosis of lumbar region 02/20/2016  . DDD (degenerative disc disease), cervical 10/19/2012  . PVCs (premature ventricular contractions) 03/12/2011  . Vitamin D deficiency 01/01/2008  . DJD (degenerative joint disease), multiple sites 01/01/2008    Past Medical History:  Diagnosis Date  . Allergy   . Cyst    hip; probable epidemoid inclusion cyst; resolution with antibiotics  . Right knee meniscal tear  07/2011   Dr Hal Morales, Ortho;  S/P cortisone injection  . Spinal stenosis at L4-L5 level 2017   bilateral  . Tear of left acetabular labrum 2006 ?   Dr Sallyanne Havers  . Vitamin D deficiency     Past Surgical History:  Procedure Laterality Date  . ANKLE SURGERY  1989    Post fracture   . DILATION AND CURETTAGE OF UTERUS  1988  . FOOT SURGERY     left- bone spurs, Dr Mayer Camel  . HAND SURGERY     Thumb surgery x 2  . KNEE ARTHROSCOPY Right 2013  . LAPAROTOMY  1988   For Dysmenorrhea    Current Outpatient Medications  Medication Sig Dispense Refill  . Cholecalciferol (VITAMIN D3) 2000 UNITS TABS Take by mouth daily.      . diclofenac sodium (VOLTAREN) 1 % GEL Apply 4 g topically 4 (four) times daily. 100 g 3  . fexofenadine (ALLEGRA) 180 MG tablet Take 180 mg by mouth as needed.     . meloxicam (MOBIC) 15 MG tablet Take 15 mg by mouth as needed.     . methocarbamol (ROBAXIN) 500 MG tablet Take 1 tablet by mouth as needed.  1  . Misc Natural Products (TART CHERRY ADVANCED PO) Take by mouth.    . TURMERIC  PO Take by mouth daily.     No current facility-administered medications for this visit.      ALLERGIES: Patient has no known allergies.  Family History  Problem Relation Age of Onset  . Parkinsonism Father   . Colon polyps Father        No colon cancer  . Transient ischemic attack Father   . Breast cancer Mother 39  . Coronary artery disease Mother        ? radiation related  . Arthritis Mother        DJD  . Arthritis Maternal Grandmother        DJD  . Diabetes Maternal Grandfather   . Heart attack Neg Hx     Social History   Socioeconomic History  . Marital status: Single    Spouse name: Not on file  . Number of children: 0  . Years of education: Not on file  . Highest education level: Not on file  Occupational History  . Not on file  Social Needs  . Financial resource strain: Not on file  . Food insecurity:    Worry: Not on file    Inability: Not on file  . Transportation needs:     Medical: Not on file    Non-medical: Not on file  Tobacco Use  . Smoking status: Never Smoker  . Smokeless tobacco: Never Used  Substance and Sexual Activity  . Alcohol use: Yes    Alcohol/week: 2.0 - 3.0 standard drinks    Types: 2 - 3 Glasses of wine per week    Comment: social  . Drug use: No  . Sexual activity: Not Currently    Partners: Male  Lifestyle  . Physical activity:    Days per week: Not on file    Minutes per session: Not on file  . Stress: Not on file  Relationships  . Social connections:    Talks on phone: Not on file    Gets together: Not on file    Attends religious service: Not on file    Active member of club or organization: Not on file    Attends meetings of clubs or organizations: Not on file    Relationship status: Not on file  . Intimate partner violence:    Fear of current or ex partner: Not on file    Emotionally abused: Not on file    Physically abused: Not on file    Forced sexual activity: Not on file  Other Topics Concern  . 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  Walks dog, gardening, PT for back    Review of Systems  All other systems reviewed and are negative.   PHYSICAL EXAMINATION:    BP 112/68 (BP Location: Right Arm, Patient Position: Sitting, Cuff Size: Large)   Pulse 70   Ht 5' 6.75" (1.695 m)   Wt 162 lb 6.4 oz (73.7 kg)   LMP 09/20/2017 (Exact Date)   BMI 25.63 kg/m     General appearance: alert, cooperative and appears stated age  Pelvic: External genitalia:  no lesions              Urethra:  normal appearing urethra with no masses, tenderness or lesions              Bartholins and Skenes: normal                 Vagina: normal appearing vagina with normal color  and yellow thin discharge, no lesions              Cervix: no lesions                Bimanual Exam:  Uterus:  normal size, contour, position, consistency, mobility, non-tender              Adnexa: no mass, fullness,  tenderness                 Chaperone was present for exam.  ASSESSMENT  Vaginal discharge.  I suspect BV. Metrorrhagia.  Perimenopausal by labs. FH breast cancer.  Mother age 29.   PLAN  Affirm.  I discussed vaginitis and BV with patient.  I anticipate tx with Metrogel or Flagyl but will wait for results. Return for pelvic US, sonohysterogram, EMB.  Discussed possible causes of bleeding - hormonal changes, infection, inflammation, polyp, hyperplasia or cancer.    An After Visit Summary was printed and given to the patient.  __15____ minutes face to face time of which over 50% was spent in counseling.

## 2018-01-20 ENCOUNTER — Encounter: Payer: Self-pay | Admitting: Obstetrics and Gynecology

## 2018-01-20 ENCOUNTER — Ambulatory Visit: Payer: BLUE CROSS/BLUE SHIELD | Admitting: Obstetrics and Gynecology

## 2018-01-20 VITALS — BP 112/68 | HR 70 | Ht 66.75 in | Wt 162.4 lb

## 2018-01-20 DIAGNOSIS — N898 Other specified noninflammatory disorders of vagina: Secondary | ICD-10-CM

## 2018-01-20 DIAGNOSIS — N921 Excessive and frequent menstruation with irregular cycle: Secondary | ICD-10-CM | POA: Diagnosis not present

## 2018-01-20 NOTE — Patient Instructions (Signed)

## 2018-01-21 LAB — VAGINITIS/VAGINOSIS, DNA PROBE
Candida Species: NEGATIVE
Gardnerella vaginalis: POSITIVE — AB
Trichomonas vaginosis: NEGATIVE

## 2018-01-23 ENCOUNTER — Telehealth: Payer: Self-pay | Admitting: *Deleted

## 2018-01-23 MED ORDER — METRONIDAZOLE 500 MG PO TABS: 500.0000 mg | ORAL_TABLET | Freq: Two times a day (BID) | ORAL | 0 refills | Status: DC

## 2018-01-23 NOTE — Telephone Encounter (Signed)
Return call to [place].

## 2018-01-23 NOTE — Telephone Encounter (Signed)
Spoke with patient, advised as seen below per Dr. Quincy Simmonds. Flagyl PO to verified pharmacy. ETOH precautions reviewed.  Patient verbalizes understanding and is agreeable. Encounter closed.

## 2018-01-23 NOTE — Telephone Encounter (Signed)
Notes recorded by Burnice Logan, RN on 01/23/2018 at 3:42 PM EST Left message to call Sharee Pimple, RN at [facility].

## 2018-01-23 NOTE — Telephone Encounter (Signed)
-----   Message from Nunzio Cobbs, MD sent at 01/22/2018  5:23 PM EST ----- Please inform of Affirm result showing bacterial vaginosis. She may treat with Flagyl 500 mg po bid for 7 days or Metrogel pv at hs for 5 nights.  Please send Rx to pharmacy of choice. ETOH precautions.

## 2018-01-24 ENCOUNTER — Telehealth: Payer: Self-pay | Admitting: Obstetrics and Gynecology

## 2018-01-24 NOTE — Telephone Encounter (Signed)
Call placed to review benefits for sonohysterogram and endometrial biopsy. Left voicemail message requesting a return call.

## 2018-01-24 NOTE — Telephone Encounter (Signed)
Patient returned call. Reviewed benefits for a sonohysterogram wit an endometrial biopsy. Patient understood and is agreeable. Patient is scheduled on 02/16/18 with Dr Quincy Simmonds. Patient is aware of appointment date, arrival time and cancellation policy. Patient had no further questions.  Forwarding to Dr Quincy Simmonds for final review. Patient is agreeable to disposition. Will close encounter

## 2018-02-07 NOTE — Progress Notes (Signed)
Office Visit Note  Patient: Michele Bennett             Date of Birth: [birth year]           MRN: [id number]             PCP: Binnie Rail, MD Referring: Binnie Rail, MD Visit Date: 02/08/2018 Occupation: @GUAROCC @  Subjective:  Left knee pain   History of Present Illness: KENNAH HEHR is a 57 y.o. female with history of DDD and osteoarthritis.  She continues to chronic neck and lower back pain.  She states she will be following up with a neurosurgeon soon to review the MRI results of her neck.  She reports she is also having pain the left knee joint.  She denies any joint swelling.  She has some difficulty getting up from a chair and climbing steps. She had a x-ray of the left knee at [facility] in April 2019.  She had a cortisone injection in March 2019 but she did not get any relief. She would like to apply for visco gel injections of the left knee joint.  She discontinued Mobic in the spring due to GI upset. She has been taking tylenol for pain relief. She tried physical therapy for 6 months but continues to have discomfort. She has occasional discomfort at night.     Activities of Daily Living:  Patient reports morning stiffness for 10 minutes.   Patient Reports nocturnal pain.  Difficulty dressing/grooming: Denies Difficulty climbing stairs: Reports Difficulty getting out of chair: Reports Difficulty using hands for taps, buttons, cutlery, and/or writing: Reports  Review of Systems  Constitutional: Positive for fatigue.  HENT: Positive for mouth dryness. Negative for mouth sores and nose dryness.   Eyes: Negative for pain, visual disturbance and dryness.  Respiratory: Negative for cough, hemoptysis, shortness of breath and difficulty breathing.   Cardiovascular: Negative for chest pain, palpitations, hypertension and swelling in legs/feet.  Gastrointestinal: Negative for blood in stool, constipation and diarrhea.  Endocrine: Negative for increased  urination.  Genitourinary: Negative for painful urination.  Musculoskeletal: Positive for arthralgias, joint pain and morning stiffness. Negative for joint swelling, myalgias, muscle weakness, muscle tenderness and myalgias.  Skin: Negative for color change, pallor, rash, hair loss, nodules/bumps, skin tightness, ulcers and sensitivity to sunlight.  Allergic/Immunologic: Negative for susceptible to infections.  Neurological: Negative for dizziness, numbness, headaches and weakness.  Hematological: Negative for swollen glands.  Psychiatric/Behavioral: Negative for depressed mood and sleep disturbance. The patient is not nervous/anxious.     PMFS History:  Patient Active Problem List   Diagnosis Date Noted  . Bronchitis 12/31/2017  . Primary osteoarthritis of both knees 12/09/2016  . Plantar fasciitis 12/09/2016  . Primary osteoarthritis of both feet 12/09/2016  . Primary osteoarthritis of both hands 12/09/2016  . DDD (degenerative disc disease), lumbar 12/09/2016  . Lateral epicondylitis, right elbow 12/09/2016  . Lumbar radiculopathy 02/25/2016  . Spinal stenosis of lumbar region 02/20/2016  . DDD (degenerative disc disease), cervical 10/19/2012  . PVCs (premature ventricular contractions) 03/12/2011  . Vitamin D deficiency 01/01/2008  . DJD (degenerative joint disease), multiple sites 01/01/2008    Past Medical History:  Diagnosis Date  . Allergy   . Cyst    hip; probable epidemoid inclusion cyst; resolution with antibiotics  . Right knee meniscal tear  07/2011   Dr Hal Morales, Ortho; S/P cortisone injection  . Spinal stenosis at L4-L5 level 2017   bilateral  . Tear of left acetabular  labrum 2006 ?   Dr Sallyanne Havers  . Vitamin D deficiency     Family History  Problem Relation Age of Onset  . Parkinsonism Father   . Colon polyps Father        No colon cancer  . Transient ischemic attack Father   . Breast cancer Mother 56  . Coronary artery disease Mother        ? radiation  related  . Arthritis Mother        DJD  . Arthritis Maternal Grandmother        DJD  . Diabetes Maternal Grandfather   . Heart attack Neg Hx    Past Surgical History:  Procedure Laterality Date  . ANKLE SURGERY  1989    Post fracture   . DILATION AND CURETTAGE OF UTERUS  1988  . FOOT SURGERY     left- bone spurs, Dr Mayer Camel  . HAND SURGERY     Thumb surgery x 2  . KNEE ARTHROSCOPY Right 2013  . 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  Walks dog, gardening, PT for back    Objective: Vital Signs: BP 118/80 (BP Location: Left Arm, Patient Position: Sitting, Cuff Size: Small)   Pulse 77   Resp 14   Ht 5\' 7"  (1.702 m)   Wt 162 lb 6.4 oz (73.7 kg)   BMI 25.44 kg/m    Physical Exam  Constitutional: She is oriented to person, place, and time. She appears well-developed and well-nourished.  HENT:  Head: Normocephalic and atraumatic.  Eyes: Conjunctivae and EOM are normal.  Neck: Normal range of motion.  Cardiovascular: Normal rate, regular rhythm, normal heart sounds and intact distal pulses.  Pulmonary/Chest: Effort normal and breath sounds normal.  Abdominal: Soft. Bowel sounds are normal.  Lymphadenopathy:    She has no cervical adenopathy.  Neurological: She is alert and oriented to person, place, and time.  Skin: Skin is warm and dry. Capillary refill takes less than 2 seconds.  Psychiatric: She has a normal mood and affect. Her behavior is normal.  Nursing note and vitals reviewed.    Musculoskeletal Exam: C-spine limited ROM with lateral rotation. thoracic spine and lumbar spine good ROM.  Shoulder joints, elbow joints, wrist joints, MCPs, PIPs, and DIPs good ROM with no synovitis.  PIP and DIP synovial thickening consistent with osteoarthritis.  Right middle trigger finger.  Hip joints, knee joints,ankle joints, MTPs, PIPs, and DIPs good ROM with no synovitis.  No warmth or effusion of knee  joints.  No tenderness or swelling of ankle joints.  No tenderness of trochanteric bursa bilaterally.   CDAI Exam: CDAI Score: Not documented Patient Global Assessment: Not documented; Provider Global Assessment: Not documented Swollen: Not documented; Tender: Not documented Joint Exam   Not documented   There is currently no information documented on the homunculus. Go to the Rheumatology activity and complete the homunculus joint exam.  Investigation: Findings:  October 26, 2017 MRI of the cervical spine showed moderate severe C6-C7 central stenosis and moderate flattening of the cord.  Mild C6-7 central stenosis and mild flattening of the cord.  Mild C4-5 central stenosis and mild indentation upon midline cord severe right C2-3 facet arthrosis with associated to have inflammation read by Dr. Luiz Iron   Imaging: No results found.  Recent Labs: Lab Results  Component Value Date   WBC 6.0 04/22/2017   HGB 13.4 04/22/2017  PLT 315 04/22/2017   NA 140 07/28/2017   K 4.3 07/28/2017   CL 105 07/28/2017   CO2 28 07/28/2017   GLUCOSE 91 07/28/2017   BUN 16 07/28/2017   CREATININE 0.89 07/28/2017   BILITOT 0.6 07/28/2017   ALKPHOS 56 07/28/2017   AST 12 07/28/2017   ALT 10 07/28/2017   PROT 6.7 07/28/2017   ALBUMIN 3.9 07/28/2017   CALCIUM 9.3 07/28/2017   GFRAA 87 12/14/2016    Speciality Comments: No specialty comments available.  Procedures:  No procedures performed Allergies: Patient has no known allergies.   Assessment / Plan:     Visit Diagnoses: DDD (degenerative disc disease), cervical: Chronic pain. She has limited ROM with lateral rotation. She had a MRI performed on 06/26/17 showed moderate severe C6-C7 central stenosis and moderate flattening of the cord.  Mild C6-7 central stenosis and mild flattening of the cord.  Mild C4-5 central stenosis and mild indentation upon midline cord severe right C2-3 facet arthrosis with associated to have inflammation  read by Dr. Luiz Iron.  She will be following up with a neurosurgeon for further evaluation.   DDD (degenerative disc disease), lumbar: No midline spinal tenderness. She experiences discomfort in her lower back if she stands for prolonged periods of time.    Primary osteoarthritis of both hands - She has PIP and DIP synovial thickening.  No synovitis noted.  Complete fist formation bilaterally.  She takes tumeric and tart cherry daily.  She has been taking tylenol for pain relief.  She performs hand exercises. Joint protection and muscle strengthening were discussed.    Trigger middle finger of right hand: She continues to have triggering intermittently.  She declined a cortisone injection at this time.   Primary osteoarthritis of both knees -She had a x-ray of both knees on July 06, 2017 performed at [facility] per patient.  She is going to bring copies of the x-ray report to our office. Dr. Estanislado Pandy reviewed X-rays on the patient phone today that revealed severe narrowing of the right knee. medial osteophytes noted. Left knee x-ray revealed severe narrowing.  She tried going to PT for 6 months.  She uses voltaren gel topically and takes tylenol for pain relief.  She has tried cortisone injections in the past, but the most recent injection did not provide any relief.  She has had hyalgan gel injections in the past with great results.  She would like to reapply for bilateral visco gel injections.   Primary osteoarthritis of both feet: She osteoarthritic changes in both feet.  She has no discomfort at this time.  She wears proper fitting shoes.   Long term (current) use of non-steroidal anti-inflammatories (nsaid): She discontinued taking Mobic due to GI upset.  She has been taking tylenol for pain relief.   Vitamin D deficiency: She takes vitamin D 2,000 units by mouth daily.   Plantar fasciitis: Resolved.    Orders: No orders of the defined types were placed in this  encounter.  No orders of the defined types were placed in this encounter.   Face-to-face time spent with patient was 35minutes. Greater than 50% of time was spent in counseling and coordination of care.  Follow-Up Instructions: Return in about 6 months (around 08/09/2018) for DDD, Osteoarthritis.   Ofilia Neas, PA-C   I examined and evaluated the patient with Hazel Sams PA.  Patient brought her MRI of her C-spine today which we reviewed.  She is appointment coming up with the neurosurgeon.  She also have symptoms of right third trigger finger.  For which Voltaren gel was discussed.  She has been having pain and discomfort in her bilateral knee joints more prominent in the left knee joint.  We reviewed the x-rays today which showed moderate to severe osteoarthritis.  She had good results to Visco supplement injections in the past.  We will apply for Visco supplement injections.  The plan of care was discussed as noted above.  Bo Merino, MD  Note - This record has been created using Editor, commissioning.  Chart creation errors have been sought, but may not always  have been located. Such creation errors do not reflect on  the standard of medical care.

## 2018-02-08 ENCOUNTER — Ambulatory Visit: Payer: BLUE CROSS/BLUE SHIELD | Admitting: Rheumatology

## 2018-02-08 ENCOUNTER — Telehealth: Payer: Self-pay

## 2018-02-08 ENCOUNTER — Telehealth (INDEPENDENT_AMBULATORY_CARE_PROVIDER_SITE_OTHER): Payer: Self-pay

## 2018-02-08 ENCOUNTER — Encounter: Payer: Self-pay | Admitting: Rheumatology

## 2018-02-08 VITALS — BP 118/80 | HR 77 | Resp 14 | Ht 67.0 in | Wt 162.4 lb

## 2018-02-08 DIAGNOSIS — M19041 Primary osteoarthritis, right hand: Secondary | ICD-10-CM

## 2018-02-08 DIAGNOSIS — E559 Vitamin D deficiency, unspecified: Secondary | ICD-10-CM

## 2018-02-08 DIAGNOSIS — M19071 Primary osteoarthritis, right ankle and foot: Secondary | ICD-10-CM

## 2018-02-08 DIAGNOSIS — M5136 Other intervertebral disc degeneration, lumbar region: Secondary | ICD-10-CM | POA: Diagnosis not present

## 2018-02-08 DIAGNOSIS — Z791 Long term (current) use of non-steroidal anti-inflammatories (NSAID): Secondary | ICD-10-CM

## 2018-02-08 DIAGNOSIS — M5030 Other cervical disc degeneration, unspecified cervical region: Secondary | ICD-10-CM | POA: Diagnosis not present

## 2018-02-08 DIAGNOSIS — M65331 Trigger finger, right middle finger: Secondary | ICD-10-CM | POA: Diagnosis not present

## 2018-02-08 DIAGNOSIS — M19042 Primary osteoarthritis, left hand: Secondary | ICD-10-CM

## 2018-02-08 DIAGNOSIS — M170 Bilateral primary osteoarthritis of knee: Secondary | ICD-10-CM

## 2018-02-08 DIAGNOSIS — M722 Plantar fascial fibromatosis: Secondary | ICD-10-CM

## 2018-02-08 DIAGNOSIS — M19072 Primary osteoarthritis, left ankle and foot: Secondary | ICD-10-CM

## 2018-02-08 NOTE — Telephone Encounter (Signed)
Noted  

## 2018-02-08 NOTE — Telephone Encounter (Signed)
Please apply for bilateral knee visco, per Lovena Le.  Patient has had hyalgan in the past. Patient requested injections before the end of the year. Thanks!

## 2018-02-08 NOTE — Telephone Encounter (Signed)
Submitted VOB for Synvisc series, bilateral knee. 

## 2018-02-14 ENCOUNTER — Telehealth (INDEPENDENT_AMBULATORY_CARE_PROVIDER_SITE_OTHER): Payer: Self-pay

## 2018-02-14 NOTE — Telephone Encounter (Signed)
PA required for Synvisc series, bilateral knee. PA form faxed to [facility] at [phone].

## 2018-02-16 ENCOUNTER — Encounter: Payer: Self-pay | Admitting: Obstetrics and Gynecology

## 2018-02-16 ENCOUNTER — Other Ambulatory Visit: Payer: Self-pay

## 2018-02-16 ENCOUNTER — Ambulatory Visit (INDEPENDENT_AMBULATORY_CARE_PROVIDER_SITE_OTHER): Payer: BLUE CROSS/BLUE SHIELD

## 2018-02-16 ENCOUNTER — Ambulatory Visit (INDEPENDENT_AMBULATORY_CARE_PROVIDER_SITE_OTHER): Payer: BLUE CROSS/BLUE SHIELD | Admitting: Obstetrics and Gynecology

## 2018-02-16 ENCOUNTER — Other Ambulatory Visit: Payer: Self-pay | Admitting: Obstetrics and Gynecology

## 2018-02-16 VITALS — BP 126/64 | HR 66 | Ht 66.75 in | Wt 158.8 lb

## 2018-02-16 DIAGNOSIS — N921 Excessive and frequent menstruation with irregular cycle: Secondary | ICD-10-CM

## 2018-02-16 DIAGNOSIS — N882 Stricture and stenosis of cervix uteri: Secondary | ICD-10-CM | POA: Diagnosis not present

## 2018-02-16 DIAGNOSIS — N924 Excessive bleeding in the premenopausal period: Secondary | ICD-10-CM

## 2018-02-16 NOTE — Progress Notes (Signed)
GYNECOLOGY  VISIT   HPI: 57 y.o.   Single  Caucasian  female   G0P0000 with Patient's last menstrual period was 09/12/2017 (approximate).   here for pelvic ultrasound.    Having abnormal perimenopausal bleeding.  Spotting off and on since July, 2019 . FSH 27.9 and estradiol 185.5 on 04/22/17.  Patient prefers to avoid hormonal therapy.  She would accept more natural options.   Mother had breast cancer.   GYNECOLOGIC HISTORY: Patient's last menstrual period was 09/12/2017 (approximate). Contraception: Abstinence Menopausal hormone therapy:  none Last mammogram:  05-16-17 Neg/density B/BiRads1 Last pap smear:  04-22-17 Neg:Neg HR HPV, 03-18-14 Neg:Neg HR HPV        OB History    Gravida  0   Para  0   Term  0   Preterm  0   AB  0   Living  0     SAB  0   TAB  0   Ectopic  0   Multiple  0   Live Births  0              Patient Active Problem List   Diagnosis Date Noted  . Bronchitis 12/31/2017  . Primary osteoarthritis of both knees 12/09/2016  . Plantar fasciitis 12/09/2016  . Primary osteoarthritis of both feet 12/09/2016  . Primary osteoarthritis of both hands 12/09/2016  . DDD (degenerative disc disease), lumbar 12/09/2016  . Lateral epicondylitis, right elbow 12/09/2016  . Lumbar radiculopathy 02/25/2016  . Spinal stenosis of lumbar region 02/20/2016  . DDD (degenerative disc disease), cervical 10/19/2012  . PVCs (premature ventricular contractions) 03/12/2011  . Vitamin D deficiency 01/01/2008  . DJD (degenerative joint disease), multiple sites 01/01/2008    Past Medical History:  Diagnosis Date  . Allergy   . Cyst    hip; probable epidemoid inclusion cyst; resolution with antibiotics  . Right knee meniscal tear  07/2011   Dr Hal Morales, Ortho; S/P cortisone injection  . Spinal stenosis at L4-L5 level 2017   bilateral  . Tear of left acetabular labrum 2006 ?   Dr Sallyanne Havers  . Vitamin D deficiency     Past Surgical History:  Procedure Laterality  Date  . ANKLE SURGERY  1989    Post fracture   . DILATION AND CURETTAGE OF UTERUS  1988  . FOOT SURGERY     left- bone spurs, Dr Mayer Camel  . HAND SURGERY     Thumb surgery x 2  . KNEE ARTHROSCOPY Right 2013  . LAPAROTOMY  1988   For Dysmenorrhea    Current Outpatient Medications  Medication Sig Dispense Refill  . Cholecalciferol (VITAMIN D3) 2000 UNITS TABS Take by mouth daily.      . diclofenac sodium (VOLTAREN) 1 % GEL Apply 4 g topically 4 (four) times daily. 100 g 3  . fexofenadine (ALLEGRA) 180 MG tablet Take 180 mg by mouth as needed.     . Misc Natural Products (TART CHERRY ADVANCED PO) Take by mouth.    . TURMERIC PO Take by mouth daily.     No current facility-administered medications for this visit.      ALLERGIES: Patient has no known allergies.  Family History  Problem Relation Age of Onset  . Parkinsonism Father   . Colon polyps Father        No colon cancer  . Transient ischemic attack Father   . Breast cancer Mother 58  . Coronary artery disease Mother        ?  radiation related  . Arthritis Mother        DJD  . Arthritis Maternal Grandmother        DJD  . Diabetes Maternal Grandfather   . Heart attack Neg Hx     Social History   Socioeconomic History  . Marital status: Single    Spouse name: Not on file  . Number of children: 0  . Years of education: Not on file  . Highest education level: Not on file  Occupational History  . Not on file  Social Needs  . Financial resource strain: Not on file  . Food insecurity:    Worry: Not on file    Inability: Not on file  . Transportation needs:    Medical: Not on file    Non-medical: Not on file  Tobacco Use  . Smoking status: Never Smoker  . Smokeless tobacco: Never Used  Substance and Sexual Activity  . Alcohol use: Yes    Alcohol/week: 2.0 - 3.0 standard drinks    Types: 2 - 3 Glasses of wine per week    Comment: social  . Drug use: No  . Sexual activity: Not Currently    Partners: Male    Lifestyle  . Physical activity:    Days per week: Not on file    Minutes per session: Not on file  . Stress: Not on file  Relationships  . Social connections:    Talks on phone: Not on file    Gets together: Not on file    Attends religious service: Not on file    Active member of club or organization: Not on file    Attends meetings of clubs or organizations: Not on file    Relationship status: Not on file  . Intimate partner violence:    Fear of current or ex partner: Not on file    Emotionally abused: Not on file    Physically abused: Not on file    Forced sexual activity: Not on file  Other Topics Concern  . 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  Walks dog, gardening, PT for back    Review of Systems  All other systems reviewed and are negative.   PHYSICAL EXAMINATION:    BP 126/64 (BP Location: Right Arm, Patient Position: Sitting, Cuff Size: Normal)   Pulse 66   Ht 5' 6.75" (1.695 m)   Wt 158 lb 12.8 oz (72 kg)   LMP 09/12/2017 (Approximate)   BMI 25.06 kg/m     General appearance: alert, cooperative and appears stated age  Pelvic US Uterus with 3 small fibroids - largest 2.03 cm. EMS 8.33 mm.  Cervix with collection of echogenic fluid 24 x 10 mm.  Ovaries with bilateral follicles.  No free fluid.  Sonohysterogram/EMB Consent for procedures.  Sterile prep of cervix with Hibiclens.  Small streak of blood noted coming from pin point os.  Tenaculum to anterior cervix. Paracervical block with 10 cc 1% lidocaine, lot [id number], exp 1/23. Os finder used to open the os - large amount of old blood, chocolate like thick fluid expressed form os.  Sterile canula placed and NS injected inside uterine cavity.  No filling defects noted.  Pipelle then placed to 8 cm and passed twice.  Tissue to pathology.  No complications.  Minimal EBL.  Chaperone was present for exam.  ASSESSMENT  Abnormal perimenopausal bleeding.  Fibroids.   Cervical stenosis.   FH breast cancer.  PLAN  Discussion of fibroids and perimenopausal bleeding. Rationale and importance of the EMB to rule out hyperplasia and malignancy reiterated.  FU EMB.  Final plan to follow.  Prefers to avoid hormonal treatment if possible.    An After Visit Summary was printed and given to the patient.  __15____ minutes face to face time of which over 50% was spent in counseling.

## 2018-02-16 NOTE — Patient Instructions (Signed)

## 2018-02-22 ENCOUNTER — Telehealth (INDEPENDENT_AMBULATORY_CARE_PROVIDER_SITE_OTHER): Payer: Self-pay

## 2018-02-22 NOTE — Telephone Encounter (Signed)
Please schedule patient an appointment with Dr. Estanislado Pandy or Lovena Le.  Thank You.  Patient is approved for Synvisc series, bilateral knee. Buy and Bill Covered at 100% of the allowed amount. Patient may have a Co-pay PA required PA Approval# [id number] Valid 02/14/2018- 02/14/2019

## 2018-03-17 ENCOUNTER — Ambulatory Visit (INDEPENDENT_AMBULATORY_CARE_PROVIDER_SITE_OTHER): Payer: BLUE CROSS/BLUE SHIELD | Admitting: *Deleted

## 2018-03-17 DIAGNOSIS — Z23 Encounter for immunization: Secondary | ICD-10-CM | POA: Diagnosis not present

## 2018-03-28 ENCOUNTER — Ambulatory Visit: Payer: BLUE CROSS/BLUE SHIELD | Admitting: Physician Assistant

## 2018-04-06 ENCOUNTER — Ambulatory Visit: Payer: BLUE CROSS/BLUE SHIELD | Admitting: Rheumatology

## 2018-04-06 DIAGNOSIS — M170 Bilateral primary osteoarthritis of knee: Secondary | ICD-10-CM

## 2018-04-06 MED ORDER — HYLAN G-F 20 16 MG/2ML IX SOSY
16.0000 mg | PREFILLED_SYRINGE | INTRA_ARTICULAR | Status: AC | PRN
  Administered 2018-04-06: 16 mg via INTRA_ARTICULAR

## 2018-04-06 MED ORDER — LIDOCAINE HCL 1 % IJ SOLN
1.5000 mL | INTRAMUSCULAR | Status: AC | PRN
  Administered 2018-04-06: 1.5 mL

## 2018-04-06 NOTE — Progress Notes (Signed)
   Procedure Note  Patient: SHAKERRIA PARRAN             Date of Birth: 01/29/1961           MRN: [id number]             Visit Date: 04/06/2018  Procedures: Visit Diagnoses: Primary osteoarthritis of both knees synvisc #1 bilateral B/B Large Joint Inj: bilateral knee on 04/06/2018 10:47 AM Indications: pain Details: 25 G 1.5 in needle, medial approach  Arthrogram: No  Medications (Right): 16 mg Hylan 16 MG/2ML; 1.5 mL lidocaine 1 % Aspirate (Right): 0 mL Medications (Left): 16 mg Hylan 16 MG/2ML; 1.5 mL lidocaine 1 % Aspirate (Left): 0 mL Outcome: tolerated well, no immediate complications Procedure, treatment alternatives, risks and benefits explained, specific risks discussed. Consent was given by the patient. Immediately prior to procedure a time out was called to verify the correct patient, procedure, equipment, support staff and site/side marked as required. Patient was prepped and draped in the usual sterile fashion.     Bo Merino, M.D.

## 2018-04-13 ENCOUNTER — Ambulatory Visit (INDEPENDENT_AMBULATORY_CARE_PROVIDER_SITE_OTHER): Payer: Self-pay

## 2018-04-13 ENCOUNTER — Ambulatory Visit (INDEPENDENT_AMBULATORY_CARE_PROVIDER_SITE_OTHER): Payer: BLUE CROSS/BLUE SHIELD | Admitting: Rheumatology

## 2018-04-13 DIAGNOSIS — M170 Bilateral primary osteoarthritis of knee: Secondary | ICD-10-CM

## 2018-04-13 MED ORDER — HYLAN G-F 20 16 MG/2ML IX SOSY
16.0000 mg | PREFILLED_SYRINGE | INTRA_ARTICULAR | Status: AC | PRN
  Administered 2018-04-13: 16 mg via INTRA_ARTICULAR

## 2018-04-13 MED ORDER — LIDOCAINE HCL 1 % IJ SOLN
1.5000 mL | INTRAMUSCULAR | Status: AC | PRN
  Administered 2018-04-13: 1.5 mL

## 2018-04-13 NOTE — Progress Notes (Signed)
   Procedure Note  Patient: Michele Bennett             Date of Birth: [birth year]           MRN: [id number]             Visit Date: 04/13/2018  Procedures: Visit Diagnoses: Primary osteoarthritis of both knees - Plan: XR KNEE 3 VIEW LEFT, XR KNEE 3 VIEW RIGHT Synvisc #2 bilateral B/B Large Joint Inj: bilateral knee on 04/13/2018 9:54 AM Indications: pain Details: 25 G 1.5 in needle, medial approach  Arthrogram: No  Medications (Right): 16 mg Hylan 16 MG/2ML; 1.5 mL lidocaine 1 % Aspirate (Right): 0 mL Medications (Left): 16 mg Hylan 16 MG/2ML; 1.5 mL lidocaine 1 % Aspirate (Left): 0 mL Outcome: tolerated well, no immediate complications Procedure, treatment alternatives, risks and benefits explained, specific risks discussed. Consent was given by the patient. Immediately prior to procedure a time out was called to verify the correct patient, procedure, equipment, support staff and site/side marked as required. Patient was prepped and draped in the usual sterile fashion.    Bo Merino, MD

## 2018-04-20 ENCOUNTER — Ambulatory Visit (INDEPENDENT_AMBULATORY_CARE_PROVIDER_SITE_OTHER): Payer: BLUE CROSS/BLUE SHIELD | Admitting: Rheumatology

## 2018-04-20 DIAGNOSIS — M170 Bilateral primary osteoarthritis of knee: Secondary | ICD-10-CM | POA: Diagnosis not present

## 2018-04-20 MED ORDER — LIDOCAINE HCL 1 % IJ SOLN
1.5000 mL | INTRAMUSCULAR | Status: AC | PRN
  Administered 2018-04-20: 1.5 mL

## 2018-04-20 MED ORDER — HYLAN G-F 20 16 MG/2ML IX SOSY
16.0000 mg | PREFILLED_SYRINGE | INTRA_ARTICULAR | Status: AC | PRN
  Administered 2018-04-20: 16 mg via INTRA_ARTICULAR

## 2018-04-20 NOTE — Progress Notes (Signed)
   Procedure Note  Patient: Michele Bennett             Date of Birth: Jun 05, 1960           MRN: [id number]             Visit Date: 04/20/2018  Procedures: Visit Diagnoses: Primary osteoarthritis of both knees - Plan: Large Joint Inj: bilateral knee Synvisc #3 bilateral knee joint injections B/B Large Joint Inj: bilateral knee on 04/20/2018 10:07 AM Indications: pain Details: 25 G 1.5 in needle, medial approach  Arthrogram: No  Medications (Right): 16 mg Hylan 16 MG/2ML; 1.5 mL lidocaine 1 % Aspirate (Right): 0 mL Medications (Left): 16 mg Hylan 16 MG/2ML; 1.5 mL lidocaine 1 % Aspirate (Left): 0 mL Outcome: tolerated well, no immediate complications Procedure, treatment alternatives, risks and benefits explained, specific risks discussed. Consent was given by the patient. Immediately prior to procedure a time out was called to verify the correct patient, procedure, equipment, support staff and site/side marked as required. Patient was prepped and draped in the usual sterile fashion.    Bo Merino, MD

## 2018-05-03 ENCOUNTER — Other Ambulatory Visit: Payer: Self-pay

## 2018-05-03 ENCOUNTER — Ambulatory Visit (INDEPENDENT_AMBULATORY_CARE_PROVIDER_SITE_OTHER): Payer: BLUE CROSS/BLUE SHIELD | Admitting: Obstetrics and Gynecology

## 2018-05-03 ENCOUNTER — Encounter: Payer: Self-pay | Admitting: Obstetrics and Gynecology

## 2018-05-03 VITALS — BP 120/72 | HR 68 | Resp 16 | Ht 67.0 in | Wt 161.0 lb

## 2018-05-03 DIAGNOSIS — Z01419 Encounter for gynecological examination (general) (routine) without abnormal findings: Secondary | ICD-10-CM | POA: Diagnosis not present

## 2018-05-03 NOTE — Progress Notes (Signed)
58 y.o. G0P0000 Single Caucasian female here for annual exam.    Had pelvic US, sonhysterogram and EMB in December for abnormal uterine bleeding.  EMB showed weakly proliferative change.  Dx with fibroids and probable anovulatory bleeding.  She bled one week after her procedure.   Hot flashes at night.   States she has joint problems.  She may need bilateral knee replacements.   Labs with PCP.   Having a shed built for her gardening.   PCP:   Billey Gosling, MD  Patient's last menstrual period was 02/23/2018 (approximate).           Sexually active: No.  The current method of family planning is abstinence.    Exercising: Yes.    walking and gardening Smoker:  no  Health Maintenance: Pap: 04-22-17 Neg:Neg HR HPV, 03-18-14 Neg:Neg HR HPV History of abnormal Pap:  no MMG: 05-13-17 Neg/density b/Birads1  Colonoscopy:  05/25/13, Negative, repeat in 7 years  BMD:   n/a  Result  n/a TDaP: 12-16-15 Gardasil:   no HIV: 05-31-12 NR Hep C: 05-31-12 Neg Screening Labs:  Hb today: PCP.  Flu vaccine done.   reports that she has never smoked. She has never used smokeless tobacco. She reports current alcohol use of about 5.0 standard drinks of alcohol per week. She reports that she does not use drugs.  Past Medical History:  Diagnosis Date  . Allergy   . Cancer ([place]) 2019   basal cell on chest and back  . Cyst    hip; probable epidemoid inclusion cyst; resolution with antibiotics  . Right knee meniscal tear  07/2011   Dr Hal Morales, Ortho; S/P cortisone injection  . Spinal stenosis at L4-L5 level 2017   bilateral  . Tear of left acetabular labrum 2006 ?   Dr Sallyanne Havers  . Vitamin D deficiency     Past Surgical History:  Procedure Laterality Date  . ANKLE SURGERY  1989    Post fracture   . DILATION AND CURETTAGE OF UTERUS  1988  . FOOT SURGERY     left- bone spurs, Dr Mayer Camel  . HAND SURGERY     Thumb surgery x 2  . KNEE ARTHROSCOPY Right 2013  . LAPAROTOMY  1988   For Dysmenorrhea     Current Outpatient Medications  Medication Sig Dispense Refill  . Cholecalciferol (VITAMIN D3) 2000 UNITS TABS Take by mouth daily.      . diclofenac sodium (VOLTAREN) 1 % GEL Apply 4 g topically 4 (four) times daily. (Patient taking differently: Apply 4 g topically as needed. ) 100 g 3  . fexofenadine (ALLEGRA) 180 MG tablet Take 180 mg by mouth as needed.     . Misc Natural Products (TART CHERRY ADVANCED PO) Take by mouth.    . TURMERIC PO Take by mouth daily.     No current facility-administered medications for this visit.     Family History  Problem Relation Age of Onset  . Parkinsonism Father   . Colon polyps Father        No colon cancer  . Transient ischemic attack Father   . Breast cancer Mother 29  . Coronary artery disease Mother        ? radiation related  . Arthritis Mother        DJD  . Arthritis Maternal Grandmother        DJD  . Diabetes Maternal Grandfather   . Heart attack Neg Hx     Review of Systems  All other systems reviewed and are negative.   Exam:   BP 120/72 (BP Location: Right Arm, Patient Position: Sitting, Cuff Size: Normal)   Pulse 68   Resp 16   Ht 5\' 7"  (1.702 m)   Wt 161 lb (73 kg)   LMP 02/23/2018 (Approximate)   BMI 25.22 kg/m     General appearance: alert, cooperative and appears stated age Head: Normocephalic, without obvious abnormality, atraumatic Neck: no adenopathy, supple, symmetrical, trachea midline and thyroid normal to inspection and palpation Lungs: clear to auscultation bilaterally Breasts: normal appearance, no masses or tenderness, No nipple retraction or dimpling, No nipple discharge or bleeding, No axillary or supraclavicular adenopathy Heart: regular rate and rhythm Abdomen: soft, non-tender; no masses, no organomegaly Extremities: extremities normal, atraumatic, no cyanosis or edema Skin: Skin color, texture, turgor normal. No rashes or lesions Lymph nodes: Cervical, supraclavicular, and axillary nodes  normal. No abnormal inguinal nodes palpated Neurologic: Grossly normal  Pelvic: External genitalia:  no lesions              Urethra:  normal appearing urethra with no masses, tenderness or lesions              Bartholins and Skenes: normal                 Vagina: normal appearing vagina with normal color and discharge, no lesions              Cervix: no lesions              Pap taken: No. Bimanual Exam:  Uterus:  normal size, contour, position, consistency, mobility, non-tender              Adnexa: no mass, fullness, tenderness              Rectal exam: Yes.  .  Confirms.              Anus:  normal sphincter tone, no lesions  Chaperone was present for exam.  Assessment:   Well woman visit with normal exam. Menopausal symptoms.  Fibroids.  Cervical stenosis.  FH post menopausal breast cancer.   Plan: Mammogram screening. Recommended self breast awareness. Pap and HR HPV as above. Guidelines for Calcium, Vitamin D, regular exercise program including cardiovascular and weight bearing exercise. Declines tx for menopausal symptoms.  Routine labs with PCP.  She will call for abnormal uterine bleeding if it occurs. Follow up annually and prn.   After visit summary provided.

## 2018-05-03 NOTE — Patient Instructions (Signed)

## 2018-05-10 ENCOUNTER — Other Ambulatory Visit: Payer: Self-pay | Admitting: Obstetrics and Gynecology

## 2018-05-10 DIAGNOSIS — Z1231 Encounter for screening mammogram for malignant neoplasm of breast: Secondary | ICD-10-CM

## 2018-06-27 ENCOUNTER — Telehealth: Payer: Self-pay | Admitting: Obstetrics and Gynecology

## 2018-06-27 NOTE — Telephone Encounter (Signed)
Please set up a WebEx consultation with me.  Cc- Kaitlyn Sprague

## 2018-06-27 NOTE — Telephone Encounter (Signed)
Spoke with patient. Patient states that she spoke with Dr.Silva about HRT at her visit on 05/03/2018, but did not want to be on anything at that time. States since her visit she has been waking up every 1.5 hours during the night with night sweats. Not getting good rest and would like to start on oral HRT if possible. Patient states that her mother did have postmenopausal breast cancer. Advised will review with Dr.Silva and return call.

## 2018-06-27 NOTE — Telephone Encounter (Signed)
Patient calling with questions regarding hormones as discussed with Debbi on 05/03/18.

## 2018-06-28 ENCOUNTER — Ambulatory Visit (INDEPENDENT_AMBULATORY_CARE_PROVIDER_SITE_OTHER): Payer: BLUE CROSS/BLUE SHIELD | Admitting: Obstetrics and Gynecology

## 2018-06-28 ENCOUNTER — Encounter: Payer: Self-pay | Admitting: Obstetrics and Gynecology

## 2018-06-28 ENCOUNTER — Other Ambulatory Visit: Payer: Self-pay

## 2018-06-28 DIAGNOSIS — N951 Menopausal and female climacteric states: Secondary | ICD-10-CM | POA: Diagnosis not present

## 2018-06-28 MED ORDER — PROGESTERONE MICRONIZED 100 MG PO CAPS: 100.0000 mg | ORAL_CAPSULE | Freq: Every day | ORAL | 0 refills | Status: DC

## 2018-06-28 MED ORDER — ESTRADIOL 0.5 MG PO TABS: 0.5000 mg | ORAL_TABLET | Freq: Every day | ORAL | 0 refills | Status: DC

## 2018-06-28 NOTE — Progress Notes (Signed)
GYNECOLOGY  VISIT   HPI: 58 y.o.   Single  Caucasian  female   G0P0000 with No LMP recorded. (Menstrual status: Irregular Periods).   here for   Web Ex visit regarding menopausal symptoms.   She gives permission for the visit.  She is at home.  I am at the office.  12:33 pm start time. 12:51 end time. Lamont Snowball organized the consultation.   She is not sleeping well due to hot flashes, every 1.5 to 2 hours.  Not really having hot flashes during the day.  She takes off a layer of clothing off during the day.   States she has sensitive skin with bandaide use.  Concerned about weight gain.  Lost 30 pounds in the past intentionally.  Thinks she took Neurontin in the past and it did not work to treat her back pain when she had a herniated disc.  LMP 02/2018.  GYNECOLOGIC HISTORY: No LMP recorded. (Menstrual status: Irregular Periods). Contraception:  NA Menopausal hormone therapy:  none Last mammogram:  Bi-Rads 1 05/13/17. Last pap smear:  04/22/17 - neg, neg HR HPV.        OB History    Gravida  0   Para  0   Term  0   Preterm  0   AB  0   Living  0     SAB  0   TAB  0   Ectopic  0   Multiple  0   Live Births  0              Patient Active Problem List   Diagnosis Date Noted  . Bronchitis 12/31/2017  . Primary osteoarthritis of both knees 12/09/2016  . Plantar fasciitis 12/09/2016  . Primary osteoarthritis of both feet 12/09/2016  . Primary osteoarthritis of both hands 12/09/2016  . DDD (degenerative disc disease), lumbar 12/09/2016  . Lateral epicondylitis, right elbow 12/09/2016  . Lumbar radiculopathy 02/25/2016  . Spinal stenosis of lumbar region 02/20/2016  . DDD (degenerative disc disease), cervical 10/19/2012  . PVCs (premature ventricular contractions) 03/12/2011  . Vitamin D deficiency 01/01/2008  . DJD (degenerative joint disease), multiple sites 01/01/2008    Past Medical History:  Diagnosis Date  . Allergy   . Cancer ([place]) 2019    basal cell on chest and back  . Cyst    hip; probable epidemoid inclusion cyst; resolution with antibiotics  . Right knee meniscal tear  07/2011   Dr Hal Morales, Ortho; S/P cortisone injection  . Spinal stenosis at L4-L5 level 2017   bilateral  . Tear of left acetabular labrum 2006 ?   Dr Sallyanne Havers  . Vitamin D deficiency     Past Surgical History:  Procedure Laterality Date  . ANKLE SURGERY  1989    Post fracture   . DILATION AND CURETTAGE OF UTERUS  1988  . FOOT SURGERY     left- bone spurs, Dr Mayer Camel  . HAND SURGERY     Thumb surgery x 2  . KNEE ARTHROSCOPY Right 2013  . LAPAROTOMY  1988   For Dysmenorrhea    Current Outpatient Medications  Medication Sig Dispense Refill  . Cholecalciferol (VITAMIN D3) 2000 UNITS TABS Take by mouth daily.      . diclofenac sodium (VOLTAREN) 1 % GEL Apply 4 g topically 4 (four) times daily. (Patient taking differently: Apply 4 g topically as needed. ) 100 g 3  . fexofenadine (ALLEGRA) 180 MG tablet Take 180 mg by mouth as needed.     Marland Kitchen  Misc Natural Products (TART CHERRY ADVANCED PO) Take by mouth.    . TURMERIC PO Take by mouth daily.     No current facility-administered medications for this visit.      ALLERGIES: Patient has no known allergies.  Family History  Problem Relation Age of Onset  . Parkinsonism Father   . Colon polyps Father        No colon cancer  . Transient ischemic attack Father   . Breast cancer Mother 65  . Coronary artery disease Mother        ? radiation related  . Arthritis Mother        DJD  . Arthritis Maternal Grandmother        DJD  . Diabetes Maternal Grandfather   . Heart attack Neg Hx     Social History   Socioeconomic History  . Marital status: Single    Spouse name: Not on file  . Number of children: 0  . Years of education: Not on file  . Highest education level: Not on file  Occupational History  . Not on file  Social Needs  . Financial resource strain: Not on file  . Food insecurity:     Worry: Not on file    Inability: Not on file  . Transportation needs:    Medical: Not on file    Non-medical: Not on file  Tobacco Use  . Smoking status: Never Smoker  . Smokeless tobacco: Never Used  Substance and Sexual Activity  . Alcohol use: Yes    Alcohol/week: 5.0 standard drinks    Types: 5 Glasses of wine per week    Comment: social  . Drug use: No  . Sexual activity: Not Currently    Partners: Male  Lifestyle  . Physical activity:    Days per week: Not on file    Minutes per session: Not on file  . Stress: Not on file  Relationships  . Social connections:    Talks on phone: Not on file    Gets together: Not on file    Attends religious service: Not on file    Active member of club or organization: Not on file    Attends meetings of clubs or organizations: Not on file    Relationship status: Not on file  . Intimate partner violence:    Fear of current or ex partner: Not on file    Emotionally abused: Not on file    Physically abused: Not on file    Forced sexual activity: Not on file  Other Topics Concern  . 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  Walks dog, gardening, PT for back    Review of Systems  PHYSICAL EXAMINATION:    There were no vitals taken for this visit.      ASSESSMENT  Menopausal symptoms.  FH breast cancer in her mother.   PLAN  We discussed HRT, SSRI/SNRI, and Neurontin as options for care.  We reviewed HRT in detail, risks and benefits and ways to administer the medications.  Discused WHI and use of HRT which can increase risk of PE, DVT, MI, stroke and breast cancer.  She will start Estrace 0.5 mg and Prometrium 100 mg nightly.  #90 each, no refills.  She will have a follow up visit in 3 months for a recheck.  She will call for any future vaginal bleeding.    An After Visit Summary was printed and given to  the patient.  __18____ minute consultation.

## 2018-06-28 NOTE — Telephone Encounter (Signed)
Spoke with patient. Webex appointment scheduled for today at 12:30 pm with Dr.Silva. Patient is agreeable to date and time. Email on file is correct. Instructions given on how to use Webex for appointment. Patient verbalizes understanding.  Routing to provider and will close encounter.

## 2018-08-10 ENCOUNTER — Ambulatory Visit: Payer: BLUE CROSS/BLUE SHIELD | Admitting: Physician Assistant

## 2018-08-17 ENCOUNTER — Ambulatory Visit: Payer: BLUE CROSS/BLUE SHIELD

## 2018-08-21 ENCOUNTER — Telehealth: Payer: Self-pay | Admitting: Obstetrics and Gynecology

## 2018-08-21 NOTE — Telephone Encounter (Signed)
Spoke with patient. She was given Dr.Silva's recommendatons. She will monitor any further bleeding and report back if continues. She does not want to switch to [place] at this time because Estradiol 0.5mg  and Prometrium 100mg  working great for the symptoms she was really concerned about--lack of sleep and hot flashes. No lifestyle changes contributing to weight gain.   She will call back if any further problems.

## 2018-08-21 NOTE — Telephone Encounter (Signed)
Patient has some concerns about weight gain after taking hormones. She has also had some bleeding while taking the hormones.

## 2018-08-21 NOTE — Telephone Encounter (Signed)
It sound like she may have had a menstrual period.  Her prior LMP was December 2019, so she has not gone for a full year since having a menstrual cycle. Bleeding can also be common during the first few months of starting HRT.  Please have her continue to monitor this and report back if it continues.   If she would like to lower her estrogen dosage, we can switch her to Minivelle 0.0375 mg twice weekly instead of her oral estrogen.  She would continue with the Prometrium.   Has she had any lifestyle changes during the pandemic that could also be contributing to her weight gain? Increased eating? Less exercise?

## 2018-08-21 NOTE — Telephone Encounter (Signed)
Patient called stating she began Estradiol 0.5mg  and Progesterone 100mg  on 06-29-18. It has really helped with her hot flashes and insomnia. She states she has gained 7lbs and thinks may be related to HRT. She also had some brown spotting last week with 1 day of bright red blood and had to wear a pad. Will discuss with Dr.Silva for further recommendations. Routed to provider.

## 2018-08-22 ENCOUNTER — Ambulatory Visit
Admission: RE | Admit: 2018-08-22 | Discharge: 2018-08-22 | Disposition: A | Discharge status: Home / Self Care | Payer: BLUE CROSS/BLUE SHIELD | Source: Ambulatory Visit | Attending: Obstetrics and Gynecology | Admitting: Obstetrics and Gynecology

## 2018-08-22 ENCOUNTER — Other Ambulatory Visit: Payer: Self-pay

## 2018-08-22 DIAGNOSIS — Z1231 Encounter for screening mammogram for malignant neoplasm of breast: Secondary | ICD-10-CM

## 2018-09-20 ENCOUNTER — Telehealth: Payer: Self-pay | Admitting: Obstetrics and Gynecology

## 2018-09-20 ENCOUNTER — Encounter: Payer: Self-pay | Admitting: Obstetrics and Gynecology

## 2018-09-20 ENCOUNTER — Ambulatory Visit: Payer: BC Managed Care – PPO | Admitting: Obstetrics and Gynecology

## 2018-09-20 ENCOUNTER — Other Ambulatory Visit: Payer: Self-pay

## 2018-09-20 VITALS — BP 112/68 | HR 86 | Temp 97.5°F | Resp 14 | Ht 67.0 in | Wt 162.0 lb

## 2018-09-20 DIAGNOSIS — N951 Menopausal and female climacteric states: Secondary | ICD-10-CM

## 2018-09-20 DIAGNOSIS — Z79890 Hormone replacement therapy (postmenopausal): Secondary | ICD-10-CM | POA: Diagnosis not present

## 2018-09-20 MED ORDER — ESTRADIOL 0.0375 MG/24HR TD PTTW: 1.0000 | MEDICATED_PATCH | TRANSDERMAL | 2 refills | Status: DC

## 2018-09-20 MED ORDER — PROGESTERONE MICRONIZED 100 MG PO CAPS: 100.0000 mg | ORAL_CAPSULE | Freq: Every day | ORAL | 2 refills | Status: DC

## 2018-09-20 NOTE — Telephone Encounter (Signed)
Patient has multiple questions regarding HRT. Advised she is due for 3 month recheck. Made appointment to review HRT for today with Dr.Silva at 2:00pm.

## 2018-09-20 NOTE — Telephone Encounter (Signed)
Patient is asking to change the dosage of her HRT.

## 2018-09-20 NOTE — Progress Notes (Signed)
GYNECOLOGY  VISIT   HPI: 58 y.o.   Single  Caucasian  female   G0P0000 with No LMP recorded. (Menstrual status: Irregular Periods).   here for   HRT follow up- has experienced weight gain. She wants transdermal estrogen.  She is taking Estrace 0.5 mg daily and Prometrium 100 mg nightly.   Hot flashes are doing well.  Sleeping through the night.   She has gained 9 - 11 pounds.  Not snacking more.  She is very sensitive to weight gain.  She is doing Weight Watchers, which worked in the past.   She had vaginal bleeding about 6 - 7 weeks after starting the HRT.  Lasted a few days and then stopped.  She may have knee surgery in the future.  GYNECOLOGIC HISTORY: No LMP recorded. (Menstrual status: Irregular Periods). Contraception:   Menopausal hormone therapy:  Estradiol, prometrium  Last mammogram:  08-22-2018 density B/BIRADS 1 negative  Last pap smear:   04-22-17 negative, HR HPV negative         OB History    Gravida  0   Para  0   Term  0   Preterm  0   AB  0   Living  0     SAB  0   TAB  0   Ectopic  0   Multiple  0   Live Births  0              Patient Active Problem List   Diagnosis Date Noted  . Bronchitis 12/31/2017  . Primary osteoarthritis of both knees 12/09/2016  . Plantar fasciitis 12/09/2016  . Primary osteoarthritis of both feet 12/09/2016  . Primary osteoarthritis of both hands 12/09/2016  . DDD (degenerative disc disease), lumbar 12/09/2016  . Lateral epicondylitis, right elbow 12/09/2016  . Lumbar radiculopathy 02/25/2016  . Spinal stenosis of lumbar region 02/20/2016  . DDD (degenerative disc disease), cervical 10/19/2012  . PVCs (premature ventricular contractions) 03/12/2011  . Vitamin D deficiency 01/01/2008  . DJD (degenerative joint disease), multiple sites 01/01/2008    Past Medical History:  Diagnosis Date  . Allergy   . Cancer ([place]) 2019   basal cell on chest and back  . Cyst    hip; probable epidemoid inclusion  cyst; resolution with antibiotics  . Right knee meniscal tear  07/2011   Dr Hal Morales, Ortho; S/P cortisone injection  . Spinal stenosis at L4-L5 level 2017   bilateral  . Tear of left acetabular labrum 2006 ?   Dr Sallyanne Havers  . Vitamin D deficiency     Past Surgical History:  Procedure Laterality Date  . ANKLE SURGERY  1989    Post fracture   . DILATION AND CURETTAGE OF UTERUS  1988  . FOOT SURGERY     left- bone spurs, Dr Mayer Camel  . HAND SURGERY     Thumb surgery x 2  . KNEE ARTHROSCOPY Right 2013  . LAPAROTOMY  1988   For Dysmenorrhea    Current Outpatient Medications  Medication Sig Dispense Refill  . Cholecalciferol (VITAMIN D3) 2000 UNITS TABS Take by mouth daily.      . diclofenac sodium (VOLTAREN) 1 % GEL Apply 4 g topically 4 (four) times daily. (Patient taking differently: Apply 4 g topically as needed. ) 100 g 3  . estradiol (ESTRACE) 0.5 MG tablet Take 1 tablet (0.5 mg total) by mouth daily. 90 tablet 0  . fexofenadine (ALLEGRA) 180 MG tablet Take 180 mg by mouth as needed.     Marland Kitchen  Misc Natural Products (TART CHERRY ADVANCED PO) Take by mouth.    . progesterone (PROMETRIUM) 100 MG capsule Take 1 capsule (100 mg total) by mouth daily. 90 capsule 0  . TURMERIC PO Take by mouth daily.     No current facility-administered medications for this visit.      ALLERGIES: Patient has no known allergies.  Family History  Problem Relation Age of Onset  . Parkinsonism Father   . Colon polyps Father        No colon cancer  . Transient ischemic attack Father   . Breast cancer Mother 26  . Coronary artery disease Mother        ? radiation related  . Arthritis Mother        DJD  . Arthritis Maternal Grandmother        DJD  . Diabetes Maternal Grandfather   . Heart attack Neg Hx     Social History   Socioeconomic History  . Marital status: Single    Spouse name: Not on file  . Number of children: 0  . Years of education: Not on file  . Highest education level: Not on file   Occupational History  . Not on file  Social Needs  . Financial resource strain: Not on file  . Food insecurity    Worry: Not on file    Inability: Not on file  . Transportation needs    Medical: Not on file    Non-medical: Not on file  Tobacco Use  . Smoking status: Never Smoker  . Smokeless tobacco: Never Used  Substance and Sexual Activity  . Alcohol use: Yes    Alcohol/week: 5.0 standard drinks    Types: 5 Glasses of wine per week    Comment: social  . Drug use: No  . Sexual activity: Not Currently    Partners: Male  Lifestyle  . Physical activity    Days per week: Not on file    Minutes per session: Not on file  . Stress: Not on file  Relationships  . Social Herbalist on phone: Not on file    Gets together: Not on file    Attends religious service: Not on file    Active member of club or organization: Not on file    Attends meetings of clubs or organizations: Not on file    Relationship status: Not on file  . Intimate partner violence    Fear of current or ex partner: Not on file    Emotionally abused: Not on file    Physically abused: Not on file    Forced sexual activity: Not on file  Other Topics Concern  . 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  Walks dog, gardening, PT for back    Review of Systems  Constitutional: Positive for unexpected weight change.  HENT: Negative.   Eyes: Negative.   Respiratory: Negative.   Cardiovascular: Negative.   Gastrointestinal: Negative.   Endocrine: Negative.   Genitourinary: Negative.   Allergic/Immunologic: Negative.   Neurological: Negative.   Hematological: Negative.   Psychiatric/Behavioral: Negative.     PHYSICAL EXAMINATION:    BP 112/68 (BP Location: Right Arm, Patient Position: Sitting, Cuff Size: Normal)   Pulse 86   Temp (!) 97.5 F (36.4 C) (Temporal)   Resp 14   Ht 5\' 7"  (1.702 m)   Wt 162 lb (73.5 kg)   BMI 25.37 kg/m  General appearance:  alert, cooperative and appears stated age  ASSESSMENT  Menopausal symptoms, controlled on HRT.  Weight gain.  FH breast cancer in mother.   PLAN  Stop oral estrogen.  Start Minivelle 0.0375 mg twice weekly.  #24, RF 2. Continue Prometrium 100 mg nightly.  #90, RF 2. Discused WHI and use of HRT which can increase risk of PE, DVT, MI, stroke and breast cancer.  Call for recurrent vaginal bleeding.  FU for annual exam and prn.    An After Visit Summary was printed and given to the patient.  _15___ minutes face to face time of which over 50% was spent in counseling.

## 2018-09-28 ENCOUNTER — Encounter: Payer: Self-pay | Admitting: Internal Medicine

## 2018-10-04 NOTE — Patient Instructions (Addendum)
Tests ordered today. Your results will be released to [place] (or called to you) after review.  If any changes need to be made, you will be notified at that same time.  All other Health Maintenance issues reviewed.   All recommended immunizations and age-appropriate screenings are up-to-date or discussed.  No immunization administered today.   An EKG was ordered for today.  Medications reviewed and updated.  Changes include :   None  If your chest tightness continues call or mychart me so we can consider a referral to cardiology.    Please followup in 1 year    Health Maintenance, Female Adopting a healthy lifestyle and getting preventive care are important in promoting health and wellness. Ask your health care provider about:  The right schedule for you to have regular tests and exams.  Things you can do on your own to prevent diseases and keep yourself healthy. What should I know about diet, weight, and exercise? Eat a healthy diet   Eat a diet that includes plenty of vegetables, fruits, low-fat dairy products, and lean protein.  Do not eat a lot of foods that are high in solid fats, added sugars, or sodium. Maintain a healthy weight Body mass index (BMI) is used to identify weight problems. It estimates body fat based on height and weight. Your health care provider can help determine your BMI and help you achieve or maintain a healthy weight. Get regular exercise Get regular exercise. This is one of the most important things you can do for your health. Most adults should:  Exercise for at least 150 minutes each week. The exercise should increase your heart rate and make you sweat (moderate-intensity exercise).  Do strengthening exercises at least twice a week. This is in addition to the moderate-intensity exercise.  Spend less time sitting. Even light physical activity can be beneficial. Watch cholesterol and blood lipids Have your blood tested for lipids and cholesterol at  57 years of age, then have this test every 5 years. Have your cholesterol levels checked more often if:  Your lipid or cholesterol levels are high.  You are older than 57 years of age.  You are at high risk for heart disease. What should I know about cancer screening? Depending on your health history and family history, you may need to have cancer screening at various ages. This may include screening for:  Breast cancer.  Cervical cancer.  Colorectal cancer.  Skin cancer.  Lung cancer. What should I know about heart disease, diabetes, and high blood pressure? Blood pressure and heart disease  High blood pressure causes heart disease and increases the risk of stroke. This is more likely to develop in people who have high blood pressure readings, are of African descent, or are overweight.  Have your blood pressure checked: ? Every 3-5 years if you are 80-45 years of age. ? Every year if you are 81 years old or older. Diabetes Have regular diabetes screenings. This checks your fasting blood sugar level. Have the screening done:  Once every three years after age 1 if you are at a normal weight and have a low risk for diabetes.  More often and at a younger age if you are overweight or have a high risk for diabetes. What should I know about preventing infection? Hepatitis B If you have a higher risk for hepatitis B, you should be screened for this virus. Talk with your health care provider to find out if you are at risk for  hepatitis B infection. Hepatitis C Testing is recommended for:  Everyone born from [birth year] through 1964.  Anyone with known risk factors for hepatitis C. Sexually transmitted infections (STIs)  Get screened for STIs, including gonorrhea and chlamydia, if: ? You are sexually active and are younger than 57 years of age. ? You are older than 57 years of age and your health care provider tells you that you are at risk for this type of infection. ? Your sexual  activity has changed since you were last screened, and you are at increased risk for chlamydia or gonorrhea. Ask your health care provider if you are at risk.  Ask your health care provider about whether you are at high risk for HIV. Your health care provider may recommend a prescription medicine to help prevent HIV infection. If you choose to take medicine to prevent HIV, you should first get tested for HIV. You should then be tested every 3 months for as long as you are taking the medicine. Pregnancy  If you are about to stop having your period (premenopausal) and you may become pregnant, seek counseling before you get pregnant.  Take 400 to 800 micrograms (mcg) of folic acid every day if you become pregnant.  Ask for birth control (contraception) if you want to prevent pregnancy. Osteoporosis and menopause Osteoporosis is a disease in which the bones lose minerals and strength with aging. This can result in bone fractures. If you are 46 years old or older, or if you are at risk for osteoporosis and fractures, ask your health care provider if you should:  Be screened for bone loss.  Take a calcium or vitamin D supplement to lower your risk of fractures.  Be given hormone replacement therapy (HRT) to treat symptoms of menopause. Follow these instructions at home: Lifestyle  Do not use any products that contain nicotine or tobacco, such as cigarettes, e-cigarettes, and chewing tobacco. If you need help quitting, ask your health care provider.  Do not use street drugs.  Do not share needles.  Ask your health care provider for help if you need support or information about quitting drugs. Alcohol use  Do not drink alcohol if: ? Your health care provider tells you not to drink. ? You are pregnant, may be pregnant, or are planning to become pregnant.  If you drink alcohol: ? Limit how much you use to 0-1 drink a day. ? Limit intake if you are breastfeeding.  Be aware of how much  alcohol is in your drink. In the [place] one drink equals one 12 oz bottle of beer (355 mL), one 5 oz glass of wine (148 mL), or one 1 oz glass of hard liquor (44 mL). General instructions  Schedule regular health, dental, and eye exams.  Stay current with your vaccines.  Tell your health care provider if: ? You often feel depressed. ? You have ever been abused or do not feel safe at home. Summary  Adopting a healthy lifestyle and getting preventive care are important in promoting health and wellness.  Follow your health care provider's instructions about healthy diet, exercising, and getting tested or screened for diseases.  Follow your health care provider's instructions on monitoring your cholesterol and blood pressure. This information is not intended to replace advice given to you by your health care provider. Make sure you discuss any questions you have with your health care provider. Document Released: 09/14/2010 Document Revised: 02/22/2018 Document Reviewed: 02/22/2018 Elsevier Patient Education  2020 [employer].

## 2018-10-04 NOTE — Progress Notes (Signed)
Subjective:    Patient ID: Michele Bennett, female    DOB: 09-22-1960, 58 y.o.   MRN: [id number]  HPI She is here for a physical exam.   She will be having a right TKR on 9/8.     Chest tightness:  She has had some chest tightness.  It started a couple of months ago.  It occurs a couple of times a week.  It can last several hours.  It occurs at rest.  It has not occurred at rest.  She denies associated SOB, lightheadedness and sweating.  She does not drink a lot of caffeine.  She started hormone replacement  In April.    She is not exercising as much due to her knee pain.  With her daily activities she denies chest tightness.      Medications and allergies reviewed with patient and updated if appropriate.  Patient Active Problem List   Diagnosis Date Noted  . Primary osteoarthritis of both knees 12/09/2016  . Plantar fasciitis 12/09/2016  . Primary osteoarthritis of both feet 12/09/2016  . Primary osteoarthritis of both hands 12/09/2016  . DDD (degenerative disc disease), lumbar 12/09/2016  . Lateral epicondylitis, right elbow 12/09/2016  . Lumbar radiculopathy 02/25/2016  . Spinal stenosis of lumbar region 02/20/2016  . DDD (degenerative disc disease), cervical 10/19/2012  . PVCs (premature ventricular contractions) 03/12/2011  . Vitamin D deficiency 01/01/2008  . DJD (degenerative joint disease), multiple sites 01/01/2008    Current Outpatient Medications on File Prior to Visit  Medication Sig Dispense Refill  . Cholecalciferol (VITAMIN D3) 2000 UNITS TABS Take by mouth daily.      . diclofenac sodium (VOLTAREN) 1 % GEL Apply 4 g topically 4 (four) times daily. (Patient taking differently: Apply 4 g topically as needed. ) 100 g 3  . estradiol (MINIVELLE) 0.0375 MG/24HR Place 1 patch onto the skin 2 (two) times a week. 24 patch 2  . fexofenadine (ALLEGRA) 180 MG tablet Take 180 mg by mouth as needed.     . Misc Natural Products (TART CHERRY ADVANCED PO) Take by  mouth.    . progesterone (PROMETRIUM) 100 MG capsule Take 1 capsule (100 mg total) by mouth daily. 90 capsule 2  . TURMERIC PO Take by mouth daily.     No current facility-administered medications on file prior to visit.     Past Medical History:  Diagnosis Date  . Allergy   . Cancer ([place]) 2019   basal cell on chest and back  . Cyst    hip; probable epidemoid inclusion cyst; resolution with antibiotics  . Right knee meniscal tear  07/2011   Dr Hal Morales, Ortho; S/P cortisone injection  . Spinal stenosis at L4-L5 level 2017   bilateral  . Tear of left acetabular labrum 2006 ?   Dr Sallyanne Havers  . Vitamin D deficiency     Past Surgical History:  Procedure Laterality Date  . ANKLE SURGERY  1989    Post fracture   . DILATION AND CURETTAGE OF UTERUS  1988  . FOOT SURGERY     left- bone spurs, Dr Mayer Camel  . HAND SURGERY     Thumb surgery x 2  . KNEE ARTHROSCOPY Right 2013  . LAPAROTOMY  1988   For Dysmenorrhea    Social History   Socioeconomic History  . Marital status: Single    Spouse name: Not on file  . Number of children: 0  . Years of education: Not on file  .  Highest education level: Not on file  Occupational History  . Not on file  Social Needs  . Financial resource strain: Not on file  . Food insecurity    Worry: Not on file    Inability: Not on file  . Transportation needs    Medical: Not on file    Non-medical: Not on file  Tobacco Use  . Smoking status: Never Smoker  . Smokeless tobacco: Never Used  Substance and Sexual Activity  . Alcohol use: Yes    Alcohol/week: 5.0 standard drinks    Types: 5 Glasses of wine per week    Comment: social  . Drug use: No  . Sexual activity: Not Currently    Partners: Male  Lifestyle  . Physical activity    Days per week: Not on file    Minutes per session: Not on file  . Stress: Not on file  Relationships  . Social Herbalist on phone: Not on file    Gets together: Not on file    Attends religious  service: Not on file    Active member of club or organization: Not on file    Attends meetings of clubs or organizations: Not on file    Relationship status: Not on file  Other Topics Concern  . 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  Walks dog, gardening, PT for back    Family History  Problem Relation Age of Onset  . Parkinsonism Father   . Colon polyps Father        No colon cancer  . Transient ischemic attack Father   . Breast cancer Mother 74  . Coronary artery disease Mother        ? radiation related  . Arthritis Mother        DJD  . Arthritis Maternal Grandmother        DJD  . Diabetes Maternal Grandfather   . Heart attack Neg Hx     Review of Systems  Constitutional: Negative for chills and fever.  Eyes: Negative for visual disturbance.  Respiratory: Negative for cough, shortness of breath and wheezing.   Cardiovascular: Positive for chest pain and palpitations (occ). Negative for leg swelling.  Gastrointestinal: Negative for abdominal pain, blood in stool, constipation, diarrhea and nausea.       No g erd  Genitourinary: Negative for dysuria and hematuria.  Musculoskeletal: Positive for arthralgias, back pain and neck pain.  Skin: Negative for color change and rash.  Neurological: Positive for numbness (intermittently hands, toes) and headaches (rare). Negative for dizziness and light-headedness.  Psychiatric/Behavioral: Negative for dysphoric mood. The patient is not nervous/anxious.        Objective:   Vitals:   10/05/18 0822  BP: 132/78  Pulse: (!) 59  Temp: 98.1 F (36.7 C)  SpO2: 98%   Filed Weights   10/05/18 0822  Weight: 161 lb (73 kg)   Body mass index is 25.22 kg/m.  BP Readings from Last 3 Encounters:  10/05/18 132/78  09/20/18 112/68  05/03/18 120/72    Wt Readings from Last 3 Encounters:  10/05/18 161 lb (73 kg)  09/20/18 162 lb (73.5 kg)  05/03/18 161 lb (73 kg)     Physical Exam  Constitutional: She appears well-developed and well-nourished. No distress.  HENT:  Head: Normocephalic and atraumatic.  Right Ear: External ear normal. Normal ear canal and TM Left Ear: External ear normal.  Normal ear canal and  TM Mouth/Throat: Oropharynx is clear and moist.  Eyes: Conjunctivae and EOM are normal.  Neck: Neck supple. No tracheal deviation present. No thyromegaly present.  No carotid bruit  Cardiovascular: Normal rate, regular rhythm and normal heart sounds.   No murmur heard.  No edema. Pulmonary/Chest: Effort normal and breath sounds normal. No respiratory distress. She has no wheezes. She has no rales.  Breast: deferred   Abdominal: Soft. She exhibits no distension. There is no tenderness.  Lymphadenopathy: She has no cervical adenopathy.  Skin: Skin is warm and dry. She is not diaphoretic.  Psychiatric: She has a normal mood and affect. Her behavior is normal.        Assessment & Plan:   Physical exam: Screening blood work ordered Immunizations  All up to date Colonoscopy   Up to date  Mammogram   Up to date  Gyn   Up to date  Eye exams    Up to date - borderline glaucoma Exercise    Walks dog, yard work Weight   Good BMI Skin sees derm every other year -- no concerns today Substance abuse  none  See Problem List for Assessment and Plan of chronic medical problems.   FU in one year

## 2018-10-05 ENCOUNTER — Encounter: Payer: Self-pay | Admitting: Internal Medicine

## 2018-10-05 ENCOUNTER — Ambulatory Visit (INDEPENDENT_AMBULATORY_CARE_PROVIDER_SITE_OTHER): Payer: BC Managed Care – PPO | Admitting: Internal Medicine

## 2018-10-05 ENCOUNTER — Other Ambulatory Visit: Payer: Self-pay

## 2018-10-05 VITALS — BP 132/78 | HR 59 | Temp 98.1°F | Ht 67.0 in | Wt 161.0 lb

## 2018-10-05 DIAGNOSIS — R9431 Abnormal electrocardiogram [ECG] [EKG]: Secondary | ICD-10-CM

## 2018-10-05 DIAGNOSIS — Z0001 Encounter for general adult medical examination with abnormal findings: Secondary | ICD-10-CM | POA: Diagnosis not present

## 2018-10-05 DIAGNOSIS — R0789 Other chest pain: Secondary | ICD-10-CM

## 2018-10-05 DIAGNOSIS — M170 Bilateral primary osteoarthritis of knee: Secondary | ICD-10-CM

## 2018-10-05 DIAGNOSIS — E559 Vitamin D deficiency, unspecified: Secondary | ICD-10-CM | POA: Diagnosis not present

## 2018-10-05 DIAGNOSIS — Z0000 Encounter for general adult medical examination without abnormal findings: Secondary | ICD-10-CM

## 2018-10-05 NOTE — Assessment & Plan Note (Signed)
Taking vitamin d Check level 

## 2018-10-05 NOTE — Assessment & Plan Note (Signed)
R > L Will be having a R TKR on 9/8 at [facility]

## 2018-10-05 NOTE — Assessment & Plan Note (Addendum)
Over the past couple of months she has had some intermittent chest tightness that can last up to a couple of hours.  It occurs at rest only - has not had any with activity.  No associated symptoms She does not drink a lot of caffeine   - not sure if it is related to allergy medication.  Denies excessive stress/anxiety Has not increased in frequency, She is low risk for cardiac disease EKG today:  Sinus brady at 54 bpm, anteroseptal infarct which is new compared to prior EKG from 2014.  ? Lead placement -  Given chest tightness and EKG today will refer to cardiology

## 2018-10-06 ENCOUNTER — Other Ambulatory Visit (INDEPENDENT_AMBULATORY_CARE_PROVIDER_SITE_OTHER): Payer: BC Managed Care – PPO

## 2018-10-06 DIAGNOSIS — E559 Vitamin D deficiency, unspecified: Secondary | ICD-10-CM | POA: Diagnosis not present

## 2018-10-06 DIAGNOSIS — Z0000 Encounter for general adult medical examination without abnormal findings: Secondary | ICD-10-CM | POA: Diagnosis not present

## 2018-10-06 LAB — LIPID PANEL
Cholesterol: 172 mg/dL (ref 0–200)
HDL: 62.6 mg/dL (ref >39.00)
LDL Cholesterol: 95 mg/dL (ref 0–99)
NonHDL: 109.07
Total CHOL/HDL Ratio: 3
Triglycerides: 70 mg/dL (ref 0.0–149.0)
VLDL: 14 mg/dL (ref 0.0–40.0)

## 2018-10-06 LAB — HEPATIC FUNCTION PANEL
ALT: 14 U/L (ref 0–35)
AST: 15 U/L (ref 0–37)
Albumin: 4.3 g/dL (ref 3.5–5.2)
Alkaline Phosphatase: 61 U/L (ref 39–117)
Bilirubin, Direct: 0.1 mg/dL (ref 0.0–0.3)
Total Bilirubin: 0.6 mg/dL (ref 0.2–1.2)
Total Protein: 6.8 g/dL (ref 6.0–8.3)

## 2018-10-06 LAB — TSH: TSH: 1.11 u[IU]/mL (ref 0.35–4.50)

## 2018-10-06 LAB — VITAMIN D 25 HYDROXY (VIT D DEFICIENCY, FRACTURES): VITD: 35.73 ng/mL (ref 30.00–100.00)

## 2018-10-08 ENCOUNTER — Encounter: Payer: Self-pay | Admitting: Internal Medicine

## 2018-10-10 ENCOUNTER — Encounter: Payer: Self-pay | Admitting: Internal Medicine

## 2018-10-10 DIAGNOSIS — R0789 Other chest pain: Secondary | ICD-10-CM

## 2018-10-10 DIAGNOSIS — R9431 Abnormal electrocardiogram [ECG] [EKG]: Secondary | ICD-10-CM

## 2018-10-19 ENCOUNTER — Encounter: Payer: Self-pay | Admitting: Cardiology

## 2018-10-19 ENCOUNTER — Ambulatory Visit (INDEPENDENT_AMBULATORY_CARE_PROVIDER_SITE_OTHER): Payer: BC Managed Care – PPO | Admitting: Cardiology

## 2018-10-19 ENCOUNTER — Other Ambulatory Visit: Payer: Self-pay

## 2018-10-19 ENCOUNTER — Ambulatory Visit: Payer: Self-pay | Admitting: Rheumatology

## 2018-10-19 VITALS — BP 145/90 | HR 65 | Ht 67.0 in | Wt 161.0 lb

## 2018-10-19 DIAGNOSIS — R0789 Other chest pain: Secondary | ICD-10-CM

## 2018-10-19 DIAGNOSIS — Z01810 Encounter for preprocedural cardiovascular examination: Secondary | ICD-10-CM | POA: Diagnosis not present

## 2018-10-19 DIAGNOSIS — M159 Polyosteoarthritis, unspecified: Secondary | ICD-10-CM

## 2018-10-19 DIAGNOSIS — M150 Primary generalized (osteo)arthritis: Secondary | ICD-10-CM | POA: Diagnosis not present

## 2018-10-19 DIAGNOSIS — M8949 Other hypertrophic osteoarthropathy, multiple sites: Secondary | ICD-10-CM

## 2018-10-19 NOTE — Progress Notes (Signed)
Primary Physician/Referring:  Binnie Rail, MD  Patient ID: Michele Bennett, female    DOB: 07/08/1960, 58 y.o.   MRN: [id number]  Chief Complaint  Patient presents with  . Abnormal ECG  . New Patient (Initial Visit)   HPI:    HPI: Michele Bennett  is a 58 y.o. Caucasian female with no significant cardiovascular history, no history of hypertension, hyperlipidemia or diabetes, referred to me an urgent basis by Dr. Billey Gosling for evaluation of chest pain and also abnormal EKG, patient has an upcoming right TKR.   Patient is fairly active, in spite of for right knee severe osteoarthritis, she has been able to get at least 5000 steps of walking a day without any shortness of breath or chest pain.  Symptoms of chest pain are all occurring at rest and last  From 1 minute sometimes up to 8 minutes.  Yesterday she had chest discomfort in the form of tightness on the left upper part of the chest all day.  This did not slow her down and she did all her routine activities without any symptoms of dyspnea, palpitations, dizziness or worsening chest pain.There is no family history of premature coronary artery disease, she is a nonsmoker.  Past Medical History:  Diagnosis Date  . Allergy   . Cancer ([place]) 2019   basal cell on chest and back  . Cyst    hip; probable epidemoid inclusion cyst; resolution with antibiotics  . Right knee meniscal tear  07/2011   Dr Hal Morales, Ortho; S/P cortisone injection  . Spinal stenosis at L4-L5 level 2017   bilateral  . Tear of left acetabular labrum 2006 ?   Dr Sallyanne Havers  . Vitamin D deficiency    Past Surgical History:  Procedure Laterality Date  . ANKLE SURGERY  1989    Post fracture   . DILATION AND CURETTAGE OF UTERUS  1988  . FOOT SURGERY     left- bone spurs, Dr Mayer Camel  . HAND SURGERY     Thumb surgery x 2  . KNEE ARTHROSCOPY Right 2013  . LAPAROTOMY  1988   For Dysmenorrhea   Social History   Socioeconomic History  . Marital  status: Single    Spouse name: Not on file  . Number of children: 0  . Years of education: Not on file  . Highest education level: Not on file  Occupational History  . Not on file  Social Needs  . Financial resource strain: Not on file  . Food insecurity    Worry: Not on file    Inability: Not on file  . Transportation needs    Medical: Not on file    Non-medical: Not on file  Tobacco Use  . Smoking status: Never Smoker  . Smokeless tobacco: Never Used  Substance and Sexual Activity  . Alcohol use: Yes    Alcohol/week: 5.0 standard drinks    Types: 5 Glasses of wine per week    Comment: social  . Drug use: No  . Sexual activity: Not Currently    Partners: Male  Lifestyle  . Physical activity    Days per week: Not on file    Minutes per session: Not on file  . Stress: Not on file  Relationships  . Social Herbalist on phone: Not on file    Gets together: Not on file    Attends religious service: Not on file    Active member of club or organization:  Not on file    Attends meetings of clubs or organizations: Not on file    Relationship status: Not on file  . Intimate partner violence    Fear of current or ex partner: Not on file    Emotionally abused: Not on file    Physically abused: Not on file    Forced sexual activity: Not on file  Other Topics Concern  . 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  Walks dog, gardening, PT for back   ROS  Review of Systems  Constitution: Negative for chills, decreased appetite, malaise/fatigue and weight gain.  Cardiovascular: Positive for chest pain and palpitations (occasional and brief). Negative for dyspnea on exertion, leg swelling and syncope.  Endocrine: Negative for cold intolerance.  Hematologic/Lymphatic: Does not bruise/bleed easily.  Musculoskeletal: Positive for arthritis and joint pain. Negative for joint swelling.  Gastrointestinal: Negative for abdominal pain, anorexia,  change in bowel habit, hematochezia and melena.  Neurological: Negative for headaches and light-headedness.  Psychiatric/Behavioral: Negative for depression and substance abuse.  All other systems reviewed and are negative.  Objective  Blood pressure (!) 154/83, pulse 65, height 5\' 7"  (1.702 m), weight 161 lb (73 kg), SpO2 100 %. Body mass index is 25.22 kg/m.   Physical Exam  Constitutional: She appears well-nourished. No distress.  Moderately built  HENT:  Head: Atraumatic.  Eyes: Conjunctivae are normal.  Neck: Neck supple. No JVD present. No thyromegaly present.  Cardiovascular: Normal rate, regular rhythm, normal heart sounds and intact distal pulses. Exam reveals no gallop.  No murmur heard. Pulmonary/Chest: Effort normal and breath sounds normal.  Abdominal: Soft. Bowel sounds are normal.  Musculoskeletal: Normal range of motion.  Neurological: She is alert.  Skin: Skin is warm and dry.  Psychiatric: She has a normal mood and affect.   Radiology: No results found.  Laboratory examination:   CMP Latest Ref Rng & Units 10/06/2018 07/28/2017 12/14/2016  Glucose 70 - 99 mg/dL - 91 96  BUN 6 - 23 mg/dL - 16 12  Creatinine 0.40 - 1.20 mg/dL - 0.89 0.87  Sodium 135 - 145 mEq/L - 140 140  Potassium 3.5 - 5.1 mEq/L - 4.3 4.4  Chloride 96 - 112 mEq/L - 105 104  CO2 19 - 32 mEq/L - 28 28  Calcium 8.4 - 10.5 mg/dL - 9.3 9.8  Total Protein 6.0 - 8.3 g/dL 6.8 6.7 6.9  Total Bilirubin 0.2 - 1.2 mg/dL 0.6 0.6 0.6  Alkaline Phos 39 - 117 U/L 61 56 -  AST 0 - 37 U/L 15 12 15   ALT 0 - 35 U/L 14 10 12    CBC Latest Ref Rng & Units 04/22/2017 12/14/2016 03/01/2016  WBC 3.4 - 10.8 x10E3/uL 6.0 6.2 6.6  Hemoglobin 11.1 - 15.9 g/dL 13.4 13.6 13.6  Hematocrit 34.0 - 46.6 % 40.9 40.0 41.3  Platelets 150 - 379 x10E3/uL 315 298 300.0   Lipid Panel     Component Value Date/Time   CHOL 172 10/06/2018 0811   TRIG 70.0 10/06/2018 0811   HDL 62.60 10/06/2018 0811   CHOLHDL 3 10/06/2018 0811    VLDL 14.0 10/06/2018 0811   LDLCALC 95 10/06/2018 0811   HEMOGLOBIN A1C No results found for: HGBA1C, MPG TSH Recent Labs    10/06/18 0811  TSH 1.11   Medications   Current Outpatient Medications  Medication Instructions  . Cyanocobalamin (VITAMIN B-12 PO) Oral, Daily  . diclofenac sodium (VOLTAREN) 4 g, Topical, 4  times daily  . estradiol (MINIVELLE) 0.0375 MG/24HR 1 patch, Transdermal, 2 times weekly  . fexofenadine (ALLEGRA) 180 mg, As needed  . Misc Natural Products (TART CHERRY ADVANCED PO) Oral  . progesterone (PROMETRIUM) 100 mg, Oral, Daily  . TURMERIC PO Daily  . Vitamin D3 5,000 Units, Oral, Daily,    . Zinc 25 MG TABS 1 tablet, Oral, Daily, 20MG     Cardiac Studies:   None  Assessment     ICD-10-CM   1. Non-cardiac chest pain  R07.89 CANCELED: EKG 12-Lead  2. Preoperative cardiovascular examination  Z01.810 CANCELED: EKG 12-Lead   TKR right 11/22/18: [facility] Pennie R[facility]  3. Primary osteoarthritis involving multiple joints  M15.0 CANCELED: EKG 12-Lead    EKG on 10/05/2018 and revealed septal infaarct, possible old infarct with Q in V1-2 with TWI, new compared to 2014 question lead placement.  Recommendations:   Patient is referred to me for evaluation of chest pain and also abnormal EKG, chest pain is clearly noncardiac.  There is no association of any exertion.  She has no traditional cardiovascular risk factors.  She still is getting about 5000 steps a day without any chest pain or shortness of breath.  Most of the chest discomfort is occurring at rest. T-wave abnormality in V2 is the only new finding since 2012 EKG that I seen.  Isolated T abnormality is probably normal variant.  I also do not suspect variant angina.  I advised her to try PPI. Her blood pressure was elevated today, however review of her blood pressure from other physician offices are all completely normal.  Hence I did not add any medications and I suspect this is related to her  underlying anxiety coming to see a cardiologist with abnormal EKG.  I have reassured her.  She is achieving greater than 7 mets of physical activity a day without any symptoms.  Hence from cardiac standpoint she does not take any further cardiac testing she can be taken up for the upcoming surgery with low risk.  I'll forward a copy to her orthopedic physician.  I will see her back on a p.r.n. basis.  Adrian Prows, MD, [facility] 10/19/2018, 1:20 PM [facility]. [place] Pager: [phone] Office: [phone] If no answer Cell [phone]

## 2018-10-20 ENCOUNTER — Encounter: Payer: Self-pay | Admitting: Rheumatology

## 2018-10-20 ENCOUNTER — Encounter: Payer: Self-pay | Admitting: Obstetrics and Gynecology

## 2018-10-20 ENCOUNTER — Telehealth: Payer: Self-pay | Admitting: Obstetrics and Gynecology

## 2018-10-20 NOTE — Telephone Encounter (Signed)
Last OV 09/20/18 for menopausal symptoms.   Routing to Dr. Quincy Simmonds to review and advise.

## 2018-10-20 NOTE — Telephone Encounter (Signed)
Please read

## 2018-10-20 NOTE — Telephone Encounter (Signed)
Patient sent the following correspondence through [facility]. Routing to triage to assist patient with request.  Dr Quincy Simmonds, I have been on the .375 estrogen patch for three weeks now. I seem to be having a few more night sweats/hot flashes than when I was taking the pill (which I had been breaking into 25 mg).     I'm not wanting to go back to the pill. Do I need to give it more time for my body to get used to this form? or Do I need to try the higher dose of the patc?     I will be having a total knee replacement on Sept 8, and will probably have to go off everything, so I am unsure as to when we should "test" a different dosage.     Please let me know your thoughts.     Thank you. Barnetta Chapel

## 2018-10-22 NOTE — Telephone Encounter (Signed)
I recommend that the patient discontinue her estrogen patch and Prometrium 2 weeks prior to her surgery.   She can restart her hormones post op when her orthopedic surgeon tells her that her risk of thromboembolic events is low from a surgical standpoint.  (She may need to stay off of them for some weeks post op.)  When she restarts her hormones following surgery, I recommend she increase the estradiol patch to 0.05 mg twice weekly and continue with the Prometrium at 100 mg nightly.  She will need a new estradiol prescription.  Ok for refills until her annual exam is due as long as her mammogram is up to date.

## 2018-10-23 MED ORDER — ESTRADIOL 0.05 MG/24HR TD PTTW: 1.0000 | MEDICATED_PATCH | TRANSDERMAL | 5 refills | Status: DC

## 2018-10-23 NOTE — Telephone Encounter (Signed)
Spoke with patient, advised as seen below per Dr. Quincy Simmonds, patient agreeable to plan. Patient request 30 day supply, RX for estradiol 0.05mg  patch twice wkly #8/5RF to verified pharmacy.   Last AEX 05/03/18 Next AEX 05/07/19 Last MMG 08/22/18, normal.   Routing to provider for final review. Patient is agreeable to disposition. Will close encounter.

## 2018-11-13 ENCOUNTER — Encounter: Payer: Self-pay | Admitting: Obstetrics and Gynecology

## 2018-11-14 ENCOUNTER — Telehealth: Payer: Self-pay | Admitting: Obstetrics and Gynecology

## 2018-11-14 HISTORY — PX: REPLACEMENT TOTAL KNEE: SUR1224

## 2018-11-14 MED ORDER — ESTRADIOL 0.05 MG/24HR TD PTTW: 1.0000 | MEDICATED_PATCH | TRANSDERMAL | 1 refills | Status: DC

## 2018-11-14 NOTE — Telephone Encounter (Signed)
Patient sent the following correspondence through [place].  Dr Quincy Simmonds, I am doing fine on the patch 0.050 - as much as I can tell having spent a month on the 0.0375, then swtiching to the 0.05 and now having to be off of it for my knee surgery!  What I am writing about is my refill - two things.   First the 0.05 is written to [facility] for 30 days, as I wanted to try it and make sure it was going to work. Since it was for 30days they will only despense for 30days at a time, thus I cannot get the 90days savings. However, the generic brand they have is made by [employer] and it peels up sometimes.   Therefore, can we switch back to [facility] which has the [employer] generic brand (who knew so many different!!).  I would like the patch at 0.05 for 90 days.   Thank you. Barnetta Chapel

## 2018-11-14 NOTE — Telephone Encounter (Signed)
Rx for estrogen patch to [facility] as requested.   Rx sent on 10/23/18 discontinued. Call placed to [facility], spoke with Levada Dy, RX cancelled.   Patient notified of new RX.   Encounter closed.

## 2018-11-14 NOTE — Telephone Encounter (Signed)
Rx pended for Vivelle Dot 0.05 mg patch twice weekly #24/1RF to [facility].   Dr. Quincy Simmonds -ok to cancel previous Rx sent for 30 day supply/5 RF on 10/23/18 and send new RX?

## 2018-11-14 NOTE — Telephone Encounter (Signed)
[facility] for 90 day Rx of her estrogen patch with refills until her annual exam is due.

## 2018-12-16 ENCOUNTER — Ambulatory Visit: Payer: BC Managed Care – PPO

## 2018-12-22 ENCOUNTER — Ambulatory Visit (INDEPENDENT_AMBULATORY_CARE_PROVIDER_SITE_OTHER): Payer: BC Managed Care – PPO

## 2018-12-22 ENCOUNTER — Other Ambulatory Visit: Payer: Self-pay

## 2018-12-22 DIAGNOSIS — Z23 Encounter for immunization: Secondary | ICD-10-CM | POA: Diagnosis not present

## 2019-01-10 NOTE — Progress Notes (Signed)
Office Visit Note  Patient: Michele Bennett             Date of Birth: 1961-01-12           MRN: [id number]             PCP: Binnie Rail, MD Referring: Binnie Rail, MD Visit Date: 01/18/2019 Occupation: @GUAROCC @  Subjective:  Osteoarthritis (Doing good, right elbow pain, left knee visco)   History of Present Illness: Michele Bennett is a 58 y.o. female with a history of  DDD and osteoarthritis.  She had a right knee replacement in September and is still having some swelling.  She is going to physical therapy on a regular basis.  She states her left knee joint has been hurting a lot as she is favoring her right knee.  She would like to have repeat Visco supplement injections to her left knee joint.  Her last injections were in February 2020.  She has been also having discomfort in her right lateral epicondyle region.  She states she left to do gardening and the repeated motion causes a flare.  Currently her hands are not as painful as she has been taking Celebrex and Tylenol.  She is also taking natural anti-inflammatories.  Her right middle trigger finger has been better.  She is also doing stretching for plantar fasciitis which is helped.   Activities of Daily Living:  Patient reports morning stiffness for 15 minutes.   Patient Reports nocturnal pain.  Difficulty dressing/grooming: Denies Difficulty climbing stairs: Reports Difficulty getting out of chair: Reports Difficulty using hands for taps, buttons, cutlery, and/or writing: Reports  Review of Systems  Constitutional: Negative for fatigue, night sweats, weight gain and weight loss.  HENT: Positive for mouth dryness. Negative for mouth sores, trouble swallowing, trouble swallowing and nose dryness.   Eyes: Negative for pain, redness, visual disturbance and dryness.  Respiratory: Negative for cough, shortness of breath and difficulty breathing.   Cardiovascular: Negative for chest pain, palpitations,  hypertension, irregular heartbeat and swelling in legs/feet.  Gastrointestinal: Negative for blood in stool, constipation and diarrhea.  Endocrine: Negative for cold intolerance, excessive thirst and increased urination.  Genitourinary: Negative for difficulty urinating and vaginal dryness.  Musculoskeletal: Positive for arthralgias, gait problem, joint pain, joint swelling, morning stiffness and muscle tenderness. Negative for myalgias, muscle weakness and myalgias.  Skin: Negative for color change, rash, hair loss, skin tightness, ulcers and sensitivity to sunlight.  Allergic/Immunologic: Negative for susceptible to infections.  Neurological: Positive for numbness. Negative for dizziness, memory loss, night sweats and weakness.  Hematological: Negative for bruising/bleeding tendency and swollen glands.  Psychiatric/Behavioral: Positive for sleep disturbance. Negative for depressed mood. The patient is not nervous/anxious.     PMFS History:  Patient Active Problem List   Diagnosis Date Noted   Chest tightness 10/05/2018   Primary osteoarthritis of both knees 12/09/2016   Plantar fasciitis 12/09/2016   Primary osteoarthritis of both feet 12/09/2016   Primary osteoarthritis of both hands 12/09/2016   DDD (degenerative disc disease), lumbar 12/09/2016   Lateral epicondylitis, right elbow 12/09/2016   Lumbar radiculopathy 02/25/2016   Spinal stenosis of lumbar region 02/20/2016   DDD (degenerative disc disease), cervical 10/19/2012   PVCs (premature ventricular contractions) 03/12/2011   Vitamin D deficiency 01/01/2008   DJD (degenerative joint disease), multiple sites 01/01/2008    Past Medical History:  Diagnosis Date   Allergy    Arthritis    Cancer ([place]) 2019   basal  cell on chest and back   Cyst    hip; probable epidemoid inclusion cyst; resolution with antibiotics   Right knee meniscal tear  07/2011   Dr Hal Morales, Ortho; S/P cortisone injection   Spinal  stenosis at L4-L5 level 2017   bilateral   Tear of left acetabular labrum 2006 ?   Dr Sallyanne Havers   Vitamin D deficiency     Family History  Problem Relation Age of Onset   Parkinsonism Father    Colon polyps Father        No colon cancer   Transient ischemic attack Father    Breast cancer Mother 25   Coronary artery disease Mother        ? 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  Walks dog, gardening, PT for back   Immunization History  Administered Date(s) Administered   Influenza Split 12/21/2011   Influenza Whole 12/20/2007, 12/09/2008   Influenza,inj,Quad PF,6+ Mos 12/20/2012, 01/02/2015, 12/16/2015, 12/09/2016, 01/10/2018, 12/22/2018   Td 10/04/2005   Tdap 12/16/2015   Zoster Recombinat (Shingrix) 01/10/2018, 03/17/2018     Objective: Vital Signs: BP 122/65 (BP Location: Left Arm, Patient Position: Sitting, Cuff Size: Normal)    Pulse 94    Resp 14    Ht 5\' 7"  (1.702 m)    Wt 158 lb 9.6 oz (71.9 kg)    BMI 24.84 kg/m    Physical Exam Vitals signs and nursing note reviewed.  Constitutional:      Appearance: She is well-developed.  HENT:     Head: Normocephalic and atraumatic.  Eyes:     Conjunctiva/sclera: Conjunctivae normal.  Neck:     Musculoskeletal: Normal range of motion.  Cardiovascular:     Rate and Rhythm: Normal rate and regular rhythm.     Heart sounds: Normal heart sounds.  Pulmonary:     Effort: Pulmonary effort is normal.      Breath sounds: Normal breath sounds.  Abdominal:     General: Bowel sounds are normal.     Palpations: Abdomen is soft.  Lymphadenopathy:     Cervical: No cervical adenopathy.  Skin:    General: Skin is warm and dry.     Capillary Refill: Capillary refill takes less than 2 seconds.  Neurological:     Mental Status: She is alert and oriented to person, place, and time.  Psychiatric:        Behavior: Behavior normal.      Musculoskeletal Exam: C-spine was in good range of motion.  Shoulder joints elbow joints wrist joints were in good range of motion.  She had tenderness over right lateral epicondyle area.  She has PIP and DIP thickening in her hands consistent with osteoarthritis.  Hip joints were in good range of motion.  She  had right total knee replacement which was warm to touch and had just fusion.  Left knee joint was in good range of motion without any warmth swelling or effusion.  Ankles, MTPs PIPs and DIPs were in good range of motion.  CDAI Exam: CDAI Score: -- Patient Global: --; Provider Global: -- Swollen: --; Tender: -- Joint Exam   No joint exam has been documented for this visit   There is currently no information documented on the homunculus. Go to the Rheumatology activity and complete the homunculus joint exam.  Investigation: No additional findings.  Imaging: No results found.  Recent Labs: Lab Results  Component Value Date   WBC 6.0 04/22/2017   HGB 13.4 04/22/2017   PLT 315 04/22/2017   NA 140 07/28/2017   K 4.3 07/28/2017   CL 105 07/28/2017   CO2 28 07/28/2017   GLUCOSE 91 07/28/2017   BUN 16 07/28/2017   CREATININE 0.89 07/28/2017   BILITOT 0.6 10/06/2018   ALKPHOS 61 10/06/2018   AST 15 10/06/2018   ALT 14 10/06/2018   PROT 6.8 10/06/2018   ALBUMIN 4.3 10/06/2018   CALCIUM 9.3 07/28/2017   GFRAA 87 12/14/2016   September 28, 2018 CBC normal, BMP normal liver panel normal, vitamin D 35.73, TSH normal, LDL 95 Speciality Comments: No specialty  comments available.  Procedures:  No procedures performed Allergies: Patient has no known allergies.   Assessment / Plan:     Visit Diagnoses: Primary osteoarthritis of both hands-she has DIP and PIP thickening in her hands.  The pain has been better since she has been on NSAIDs and Tylenol combination.  Primary osteoarthritis of left knee-she is having increasing discomfort in her left knee joint.  She requests Visco supplement injections and we will apply for those.  Status post total knee replacement, right - November 21, 2018 by Dr. Margretta Sidle at [facility].  She is going for physical therapy.  She still has some warmth and swelling.  Primary osteoarthritis of both feet-proper fitting shoes were discussed.  DDD (degenerative disc disease), cervical-chronic pain  DDD (degenerative disc disease), lumbar-she is in chronic pain.  She plans to see a neurosurgeon in the future.  Trigger middle finger of right hand-currently asymptomatic.  Long term (current) use of non-steroidal anti-inflammatories (nsaid)-she brought her recent labs from July which were within normal limits.  Vitamin D deficiency-she is currently on vitamin D 5000 units a day.  Plantar fasciitis-she has been doing stretching exercises which has helped.  Lateral epicondylitis, right elbow-she is having lot of discomfort in the right lateral epicondyle region.  Have given her a handout on exercises.  Have also advised her to try the brace.  Orders: No orders of the defined types were placed in this encounter.  No orders of the defined types were placed in this encounter.     Follow-Up Instructions: Return in 6 months (on 07/18/2019) for Osteoarthritis, DDD.   Bo Merino, MD  Note - This record has been created using Editor, commissioning.  Chart creation errors have been sought, but may not always  have been located. Such creation errors do not reflect on  the standard of medical care.

## 2019-01-18 ENCOUNTER — Other Ambulatory Visit: Payer: Self-pay

## 2019-01-18 ENCOUNTER — Telehealth: Payer: Self-pay | Admitting: *Deleted

## 2019-01-18 ENCOUNTER — Ambulatory Visit: Payer: BC Managed Care – PPO | Admitting: Rheumatology

## 2019-01-18 ENCOUNTER — Encounter: Payer: Self-pay | Admitting: Rheumatology

## 2019-01-18 VITALS — BP 122/65 | HR 94 | Resp 14 | Ht 67.0 in | Wt 158.6 lb

## 2019-01-18 DIAGNOSIS — M19072 Primary osteoarthritis, left ankle and foot: Secondary | ICD-10-CM

## 2019-01-18 DIAGNOSIS — M5030 Other cervical disc degeneration, unspecified cervical region: Secondary | ICD-10-CM

## 2019-01-18 DIAGNOSIS — E559 Vitamin D deficiency, unspecified: Secondary | ICD-10-CM

## 2019-01-18 DIAGNOSIS — Z96651 Presence of right artificial knee joint: Secondary | ICD-10-CM

## 2019-01-18 DIAGNOSIS — M65331 Trigger finger, right middle finger: Secondary | ICD-10-CM

## 2019-01-18 DIAGNOSIS — M1712 Unilateral primary osteoarthritis, left knee: Secondary | ICD-10-CM

## 2019-01-18 DIAGNOSIS — M19041 Primary osteoarthritis, right hand: Secondary | ICD-10-CM

## 2019-01-18 DIAGNOSIS — M19071 Primary osteoarthritis, right ankle and foot: Secondary | ICD-10-CM | POA: Diagnosis not present

## 2019-01-18 DIAGNOSIS — Z791 Long term (current) use of non-steroidal anti-inflammatories (NSAID): Secondary | ICD-10-CM

## 2019-01-18 DIAGNOSIS — M19042 Primary osteoarthritis, left hand: Secondary | ICD-10-CM

## 2019-01-18 DIAGNOSIS — M7711 Lateral epicondylitis, right elbow: Secondary | ICD-10-CM

## 2019-01-18 DIAGNOSIS — M722 Plantar fascial fibromatosis: Secondary | ICD-10-CM

## 2019-01-18 DIAGNOSIS — M5136 Other intervertebral disc degeneration, lumbar region: Secondary | ICD-10-CM

## 2019-01-18 NOTE — Telephone Encounter (Signed)
Please apply for visco for left knee. Thank you.

## 2019-01-18 NOTE — Patient Instructions (Signed)
Elbow and Forearm Exercises Ask your health care provider which exercises are safe for you. Do exercises exactly as told by your health care provider and adjust them as directed. It is normal to feel mild stretching, pulling, tightness, or discomfort as you do these exercises. Stop right away if you feel sudden pain or your pain gets worse. Do not begin these exercises until told by your health care provider. Range-of-motion exercises These exercises warm up your muscles and joints and improve the movement and flexibility of your injured elbow and forearm. The exercises also help to relieve pain, numbness, and tingling. These exercises are done using the muscles in your injured elbow and forearm (active). Elbow flexion, active 1. Hold your left / right arm at your side, and bend your elbow (flexion) as far as you can using only your arm muscles. 2. Hold this position for __________ seconds. 3. Slowly return to the starting position. Repeat __________ times. Complete this exercise __________ times a day. Elbow extension, active 1. Hold your left / right arm at your side, and straighten your elbow (extension) as much as you can using only your arm muscles. 2. Hold this position for __________ seconds. 3. Slowly return to the starting position. Repeat __________ times. Complete this exercise __________ times a day. Active forearm rotation, supination This is an exercise in which you turn (rotate) your forearm palm up (supination). 1. Stand or sit with your elbows at your sides. 2. Bend your left / right elbow to a 90-degree angle (right angle). 3. Rotate your palm up until you feel a gentle stretch on the inside of your forearm. 4. Hold this position for __________ seconds. 5. Slowly return to the starting position. Repeat __________ times. Complete this exercise __________ times a day. Active forearm rotation, pronation This is an exercise in which you turn (rotate) your forearm palm down  (pronation). 1. Stand or sit with your elbows at your sides. 2. Bend your left / right elbow to a 90-degree angle (right angle). 3. Rotate your palm down until you feel a gentle stretch on the top of your forearm. 4. Hold this position for __________ seconds. 5. Slowly return to the starting position. Repeat __________ times. Complete this exercise __________ times a day. Stretching exercises These exercises warm up your muscles and joints and improve the movement and flexibility of your injured elbow and forearm. These exercises also help to relieve pain, numbness, and tingling. These exercises are done using your healthy elbow and forearm to help stretch the muscles in your injured elbow and forearm (active-assisted). Elbow flexion, active-assisted  1. Hold your left / right arm at your side, and bend your elbow (flexion) as much as you can using your left / right arm muscles. 2. Use your other hand to bend your left / right elbow farther. To do this, gently push up on your forearm until you feel a gentle stretch on the back of your elbow. 3. Hold this position for __________ seconds. 4. Slowly return to the starting position. Repeat __________ times. Complete this exercise __________ times a day. Elbow extension, active-assisted  1. Hold your left / right arm at your side, and straighten your elbow (extension) as much as you can using your left / right arm muscles. 2. Use your other hand to straighten the left / right elbow farther. To do this, gently push down on your forearm until you feel a gentle stretch on the inside of your elbow. 3. Hold this position for __________  seconds. 4. Slowly return to the starting position. Repeat __________ times. Complete this exercise __________ times a day. Active-assisted forearm rotation, supination This is an exercise in which you turn (rotate) your forearm palm up (supination). 1. Sit with your left / right elbow bent in a 90-degree angle (right  angle) with your forearm resting on a table. 2. Keeping your upper body and shoulder still, rotate your forearm so your palm faces upward. 3. Use your other hand to help rotate your forearm further until you feel a gentle to moderate stretch. 4. Hold this position for __________ seconds. 5. Slowly release the stretch and return to the starting position. Repeat __________ times. Complete this exercise __________ times a day. Active-assisted forearm rotation, pronation This is an exercise in which you turn (rotate) your forearm palm down (pronation). 1. Sit with your left / right elbow bent in a 90-degree angle (right angle) with your forearm resting on a table. 2. Keeping your upper body and shoulder still, rotate your forearm so your palm faces the tabletop. 3. Use your other hand to help rotate your forearm further until you feel a gentle to moderate stretch. 4. Hold this position for __________ seconds. 5. Slowly release the stretch and return to the starting position. Repeat __________ times. Complete this exercise __________ times a day. Passive elbow flexion, supine 1. Lie on your back (supine position). 2. Extend your left / right arm up in the air, bracing it with your other hand. 3. Let your left / right hand slowly lower toward your shoulder (passive flexion), while your elbow stays pointed toward the ceiling. You should feel a gentle stretch along the back of your upper arm and elbow. 4. If instructed by your health care provider, you may increase the intensity of your stretch by adding a small wrist weight or hand weight. 5. Hold this position for __________ seconds. 6. Slowly return to the starting position. Repeat __________ times. Complete this exercise __________ times a day. Passive elbow extension, supine  1. Lie on your back (supine position). Make sure that you are in a comfortable position that lets you relax your arm muscles. 2. Place a folded towel under your left /  right upper arm so your elbow and shoulder are at the same height. Straighten your left / right arm so your elbow does not rest on the bed or towel. 3. Let the weight of your hand stretch your elbow (passive extension). Keep your arm and chest muscles relaxed. You should feel a stretch on the inside of your elbow. 4. If told by your health care provider, you may increase the intensity of your stretch by adding a small wrist weight or hand weight. 5. Hold this position for __________ seconds. 6. Slowly release the stretch. Repeat __________ times. Complete this exercise __________ times a day. Strengthening exercises These exercises build strength and endurance in your elbow and forearm. Endurance is the ability to use your muscles for a long time, even after they get tired. Elbow flexion, isometric  1. Stand or sit up straight. 2. Bend your left / right elbow in a 90-degree angle (right angle), and keep your forearm at the height of your waist. Your thumb should be pointed toward the ceiling (neutral forearm). 3. Place your other hand on top of your left / right forearm. Gently push down while you resist with your left / right arm (isometric flexion). Push as hard as you can with both arms without causing any pain or movement   at your left / right elbow. 4. Hold this position for __________ seconds. 5. Slowly release the tension in both arms. Let your muscles relax completely before you repeat the exercise. Repeat __________ times. Complete this exercise __________ times a day. Elbow extension, isometric  1. Stand or sit up straight. 2. Place your left / right arm so your palm faces your abdomen and is at the height of your waist. 3. Place your other hand on the underside of your left / right forearm. Gently push up while you resist with your left / right arm (isometric extension). Push as hard as you can with both arms without causing any pain or movement at your left / right elbow. 4. Hold this  position for __________ seconds. 5. Slowly release the tension in both arms. Let your muscles relax completely before you repeat the exercise. Repeat __________ times. Complete this exercise __________ times a day. Elbow flexion with forearm palm up  1. Sit on a firm chair without armrests, or stand up. 2. Place your left / right arm at your side with your elbow straight and your palm facing forward. 3. Holding a __________weight or gripping a rubber exercise band or tubing, bend your elbow to bring your hand toward your shoulder (flexion). 4. Hold this position for __________ seconds. 5. Slowly return to the starting position. Repeat __________ times. Complete this exercise __________ times a day. Elbow extension, active  1. Sit on a firm chair without armrests, or stand up. 2. Hold a rubber exercise band or tubing in both hands. 3. Keeping your upper arms at your sides, bring both hands up to your left / right shoulder. Keep your left / right hand just below your other hand. 4. Straighten your left / right elbow (extension) while keeping your other arm still. 5. Hold this position for __________ seconds. 6. Control the resistance of the band or tubing as you return to the starting position. Repeat __________ times. Complete this exercise __________ times a day. Forearm rotation, supination  1. Sit with your left / right forearm supported on a table. Your elbow should be at waist height and bent at a 90-degree angle (right angle). 2. Gently grasp a lightweight hammer. 3. Rest your hand over the edge of the table with your palm facing down. 4. Without moving your left / right elbow, slowly rotate your forearm to turn your palm up toward the ceiling (supination). 5. Hold this position for __________ seconds. 6. Slowly return to the starting position. Repeat __________ times. Complete this exercise __________ times a day. Forearm rotation, pronation  1. Sit with your left / right forearm  supported on a table. Keep your elbow below shoulder height. 2. Gently grasp a lightweight hammer. 3. Rest your hand over the edge of the table with your palm facing up. 4. Without moving your left / right elbow, slowly rotate your forearm to turn your palm down toward the floor (pronation). 5. Hold this position for __________seconds. 6. Slowly return to the starting position. Repeat __________ times. Complete this exercise __________ times a day. This information is not intended to replace advice given to you by your health care provider. Make sure you discuss any questions you have with your health care provider. Document Released: 01/13/2005 Document Revised: 06/22/2018 Document Reviewed: 03/22/2018 Elsevier Patient Education  2020 [employer].

## 2019-01-19 NOTE — Telephone Encounter (Signed)
Submitted for VOB.

## 2019-01-22 NOTE — Telephone Encounter (Addendum)
Please call patient to schedule for Visco injections with Dr. Estanislado Pandy only.  Schedule for Synvisc series Left Knee. [place] to cover 100% with a $30.00 co pay each visit. No PA required.

## 2019-01-22 NOTE — Telephone Encounter (Signed)
LMOM for patient to call and schedule Synvisc injections for left knee.

## 2019-01-29 ENCOUNTER — Other Ambulatory Visit: Payer: Self-pay

## 2019-01-29 ENCOUNTER — Ambulatory Visit: Payer: BC Managed Care – PPO | Admitting: Rheumatology

## 2019-01-29 DIAGNOSIS — M1712 Unilateral primary osteoarthritis, left knee: Secondary | ICD-10-CM | POA: Diagnosis not present

## 2019-01-29 MED ORDER — HYLAN G-F 20 16 MG/2ML IX SOSY
16.0000 mg | PREFILLED_SYRINGE | INTRA_ARTICULAR | Status: AC | PRN
  Administered 2019-01-29: 16 mg via INTRA_ARTICULAR

## 2019-01-29 MED ORDER — LIDOCAINE HCL 1 % IJ SOLN
1.5000 mL | INTRAMUSCULAR | Status: AC | PRN
  Administered 2019-01-29: 1.5 mL

## 2019-01-29 NOTE — Progress Notes (Signed)
   Procedure Note  Patient: Michele Bennett             Date of Birth: Oct 20, 1960           MRN: [id number]             Visit Date: 01/29/2019  Procedures: Visit Diagnoses:  1. Primary osteoarthritis of left knee    Synvisc #1 left knee joint injection B/B Large Joint Inj: L knee on 01/29/2019 8:42 AM Indications: pain Details: 25 G 1.5 in needle, medial approach  Arthrogram: No  Medications: 16 mg Hylan 16 MG/2ML; 1.5 mL lidocaine 1 % Aspirate: 0 mL Outcome: tolerated well, no immediate complications Procedure, treatment alternatives, risks and benefits explained, specific risks discussed. Consent was given by the patient. Immediately prior to procedure a time out was called to verify the correct patient, procedure, equipment, support staff and site/side marked as required. Patient was prepped and draped in the usual sterile fashion.    This patient is diagnosed with osteoarthritis of the knee(s).    Radiographs show evidence of joint space narrowing, osteophytes, subchondral sclerosis and/or subchondral cysts.  This patient has knee pain which interferes with functional and activities of daily living.    This patient has experienced inadequate response, adverse effects and/or intolerance with conservative treatments such as acetaminophen, NSAIDS, topical creams, physical therapy or regular exercise, knee bracing and/or weight loss.   This patient has experienced inadequate response or has a contraindication to intra articular steroid injections for at least 3 months.   This patient is not scheduled to have a total knee replacement within 6 months of starting treatment with viscosupplementation. Bo Merino, MD

## 2019-02-05 ENCOUNTER — Telehealth: Payer: Self-pay | Admitting: Rheumatology

## 2019-02-05 ENCOUNTER — Ambulatory Visit: Payer: BC Managed Care – PPO | Admitting: Physician Assistant

## 2019-02-05 NOTE — Telephone Encounter (Signed)
LMOM for patient to call office to discuss visco.

## 2019-02-05 NOTE — Telephone Encounter (Signed)
Patient was called to let know Dr. Estanislado Pandy has decided to change to Virtual all virtual appointments. Patient would like a call back to discuss what having 1 injection of Visco, and then waiting on the next two will do to the benefit of the injections? Please call to discuss.

## 2019-02-07 NOTE — Telephone Encounter (Signed)
OK to see in office per Dr. Estanislado Pandy for Visco inj

## 2019-02-12 ENCOUNTER — Ambulatory Visit (INDEPENDENT_AMBULATORY_CARE_PROVIDER_SITE_OTHER): Payer: BC Managed Care – PPO | Admitting: Rheumatology

## 2019-02-12 ENCOUNTER — Other Ambulatory Visit: Payer: Self-pay

## 2019-02-12 DIAGNOSIS — M1712 Unilateral primary osteoarthritis, left knee: Secondary | ICD-10-CM

## 2019-02-12 MED ORDER — LIDOCAINE HCL 1 % IJ SOLN
1.5000 mL | INTRAMUSCULAR | Status: AC | PRN
  Administered 2019-02-12: 1.5 mL

## 2019-02-12 MED ORDER — HYLAN G-F 20 16 MG/2ML IX SOSY
16.0000 mg | PREFILLED_SYRINGE | INTRA_ARTICULAR | Status: AC | PRN
  Administered 2019-02-12: 16 mg via INTRA_ARTICULAR

## 2019-02-12 NOTE — Progress Notes (Signed)
   Procedure Note  Patient: Michele Bennett             Date of Birth: [birth year]           MRN: [id number]             Visit Date: 02/12/2019  Procedures: Visit Diagnoses:  1. Primary osteoarthritis of left knee    Synvisc #2 Left knee joint injection   Large Joint Inj: L knee on 02/12/2019 11:50 AM Indications: pain Details: 27 G 1.5 in needle, medial approach  Arthrogram: No  Medications: 1.5 mL lidocaine 1 %; 16 mg Hylan 16 MG/2ML Aspirate: 0 mL Outcome: tolerated well, no immediate complications Procedure, treatment alternatives, risks and benefits explained, specific risks discussed. Consent was given by the patient. Immediately prior to procedure a time out was called to verify the correct patient, procedure, equipment, support staff and site/side marked as required. Patient was prepped and draped in the usual sterile fashion.     Bo Merino, MD

## 2019-02-13 ENCOUNTER — Ambulatory Visit: Payer: BC Managed Care – PPO | Admitting: Physician Assistant

## 2019-02-19 ENCOUNTER — Other Ambulatory Visit: Payer: Self-pay

## 2019-02-19 ENCOUNTER — Ambulatory Visit (INDEPENDENT_AMBULATORY_CARE_PROVIDER_SITE_OTHER): Payer: BC Managed Care – PPO | Admitting: Rheumatology

## 2019-02-19 DIAGNOSIS — M1712 Unilateral primary osteoarthritis, left knee: Secondary | ICD-10-CM

## 2019-02-19 MED ORDER — HYLAN G-F 20 16 MG/2ML IX SOSY
16.0000 mg | PREFILLED_SYRINGE | INTRA_ARTICULAR | Status: AC | PRN
  Administered 2019-02-19: 16 mg via INTRA_ARTICULAR

## 2019-02-19 MED ORDER — LIDOCAINE HCL 1 % IJ SOLN
1.5000 mL | INTRAMUSCULAR | Status: AC | PRN
  Administered 2019-02-19: 1.5 mL

## 2019-02-19 NOTE — Progress Notes (Signed)
   Procedure Note  Patient: Michele Bennett             Date of Birth: [birth year]           MRN: [id number]             Visit Date: 02/19/2019  Procedures: Visit Diagnoses:  1. Primary osteoarthritis of left knee    Synvisc #3 Left knee joint  Large Joint Inj: L knee on 02/19/2019 1:02 PM Indications: pain Details: 27 G 1.5 in needle, medial approach  Arthrogram: No  Medications: 16 mg Hylan 16 MG/2ML; 1.5 mL lidocaine 1 % Aspirate: 0 mL Outcome: tolerated well, no immediate complications Procedure, treatment alternatives, risks and benefits explained, specific risks discussed. Consent was given by the patient. Immediately prior to procedure a time out was called to verify the correct patient, procedure, equipment, support staff and site/side marked as required. Patient was prepped and draped in the usual sterile fashion.    Bo Merino, MD

## 2019-02-26 ENCOUNTER — Telehealth: Payer: Self-pay | Admitting: Physical Medicine and Rehabilitation

## 2019-02-26 NOTE — Telephone Encounter (Signed)
If she says left sided, same etc and no trauma would consider repeat but was a patient of Dr. Otho Ket. If any significant changes with back or legs or weakness then OV me or [place]

## 2019-02-27 NOTE — Telephone Encounter (Signed)
Patient reports that pain is more right-sided. Scheduled for an OV.

## 2019-03-14 ENCOUNTER — Other Ambulatory Visit: Payer: Self-pay

## 2019-03-14 ENCOUNTER — Ambulatory Visit: Payer: BC Managed Care – PPO | Admitting: Physical Medicine and Rehabilitation

## 2019-03-14 ENCOUNTER — Encounter: Payer: Self-pay | Admitting: Physical Medicine and Rehabilitation

## 2019-03-14 VITALS — BP 125/69 | HR 82 | Ht 67.0 in | Wt 158.0 lb

## 2019-03-14 DIAGNOSIS — M47816 Spondylosis without myelopathy or radiculopathy, lumbar region: Secondary | ICD-10-CM

## 2019-03-14 DIAGNOSIS — R202 Paresthesia of skin: Secondary | ICD-10-CM

## 2019-03-14 DIAGNOSIS — G8929 Other chronic pain: Secondary | ICD-10-CM

## 2019-03-14 DIAGNOSIS — M542 Cervicalgia: Secondary | ICD-10-CM | POA: Diagnosis not present

## 2019-03-14 DIAGNOSIS — M545 Low back pain, unspecified: Secondary | ICD-10-CM

## 2019-03-14 NOTE — Progress Notes (Signed)
 .  Numeric Pain Rating Scale and Functional Assessment Average Pain 6 Pain Right Now 3 My pain is intermittent, sharp and dull Pain is worse with: bending and sitting Pain improves with: heat/ice   In the last MONTH (on 0-10 scale) has pain interfered with the following?  1. General activity like being  able to carry out your everyday physical activities such as walking, climbing stairs, carrying groceries, or moving a chair?  Rating(6)  2. Relation with others like being able to carry out your usual social activities and roles such as  activities at home, at work and in your community. Rating(6)  3. Enjoyment of life such that you have  been bothered by emotional problems such as feeling anxious, depressed or irritable?  Rating(1)

## 2019-03-14 NOTE — Progress Notes (Signed)
Michele Bennett - 58 y.o. female MRN [id number]  Date of birth: 09/18/1960  Office Visit Note: Visit Date: 03/14/2019 PCP: Binnie Rail, MD Referred by: Binnie Rail, MD  Subjective: Chief Complaint  Patient presents with  . Lower Back - Pain  . Right Hip - Pain  . Left Thigh - Numbness   HPI: Michele Bennett is a 58 y.o. female who comes in today For reevaluation of low back pain with left thigh numbness.  Patient's history is such that we saw her in 2018 and completed epidural injection which was very beneficial for her pain at the time.  Her pain at the time was more low back and left hip pain.  MRI from 2016 is reviewed again below and this did show disc protrusion paracentral left at L3-4 but also pretty severe osteoarthritis at L4-5 without central canal stenosis.  Her chart carries a diagnosis of lumbar stenosis but at least from that MRI she did not have any central stenosis.  She is someone with quite a bit of osteoarthritis.  She is followed by Dr. Cy Blamer.  She has had both knees replaced.  She has osteoarthritis in the hands and feet as well.  Her biggest complaint today is 6 out of 10 intermittent sharp and dull pain worse with standing bending and sitting and going from sit to stand of the lower back.  She still complains of persistent tingling paresthesia in the left lateral thigh which could be more of an L4 distribution.  She also reports pain over the right trochanteric area at least where she points.  She does refer this as a bursitis.  She has not had any focal weakness no red flag complaints of bowel or bladder changes no fevers chills or night sweats or specific night pain or weight loss.  She reports last injection she had was really beneficial for quite some time.  Her recent knee replacement has made her ambulate differently and even leading up to that surgery and she thinks is causing a lot of the right hip pain.  The left paresthesia is not  painful is just tingling and there and it does not seem to bother her other than the fact that she has it.  She has been using heat ice and medication without much relief ongoing of her low back pain.  As a secondary note she does complain of neck pain and shoulder pain.  She is undergoing evaluation and treatment by the spine center at [facility] for her cervical spine.  Review of Systems  Constitutional: Negative for chills, fever, malaise/fatigue and weight loss.  HENT: Negative for hearing loss and sinus pain.   Eyes: Negative for blurred vision, double vision and photophobia.  Respiratory: Negative for cough and shortness of breath.   Cardiovascular: Negative for chest pain, palpitations and leg swelling.  Gastrointestinal: Negative for abdominal pain, nausea and vomiting.  Genitourinary: Negative for flank pain.  Musculoskeletal: Positive for back pain, joint pain and neck pain. Negative for myalgias.  Skin: Negative for itching and rash.  Neurological: Positive for tingling and sensory change. Negative for tremors, focal weakness and weakness.  Endo/Heme/Allergies: Negative.   Psychiatric/Behavioral: Negative for depression.  All other systems reviewed and are negative.  Otherwise per HPI.  Assessment & Plan: Visit Diagnoses:  1. Spondylosis without myelopathy or radiculopathy, lumbar region   2. Chronic bilateral low back pain without sciatica   3. Paresthesia of skin   4. Cervicalgia  Plan: Findings:  In terms of her back pain at this point I think this is more related to the facet arthropathy at L4-5 and possibly L5-S1.  Her exam is consistent with pain with facet loading and her clinical symptoms are consistent with facet mediated low back pain.  Her left sided numbness is persistent this could have been from the disc herniation at the time but could also be a lateral femoral cutaneous nerve.  This tingling does not seem to bother her from a functional standpoint.  Her right  trochanter pain is really higher than the trochanter and I think it is referred pain from the facet joints but it could be musculotendinous as well.  We have talked at great length today about core strengthening and exercises which we can go over again.  She used to do those for her back and now has not really been doing that since the knee.  We also had a long talk about natural history of osteoarthritis of the spine.  I do not think she needs an MRI at this point although the last MRI was 2016.  If she does not get much relief from any planned procedure at this point then yes we would look at MRI of the lumbar spine.  I think the best course of action is diagnostic medial branch blocks of the lower facet joints.  If those are diagnostic with 2 and a double block paradigm then radiofrequency ablation I think would be very good for her.  I think she has anything surgical at this point.  In terms of her neck pain she will continue to follow with the spine center at [facility].  We would be happy to do more evaluation of that in the future if she needed [place] to look at that from a second opinion.    Meds & Orders: No orders of the defined types were placed in this encounter.  No orders of the defined types were placed in this encounter.   Follow-up: Return for L4-5 and L5-S1 medial branch blocks.   Procedures: No procedures performed  No notes on file   Clinical History: Lspine MRI 03/29/2014 IMPRESSION: 1. At L3-4 there is a broad central/left paracentral disc protrusion which has mild mass effect on the left intraspinal L4 nerve root. Mild bilateral facet arthropathy. There is mild relative central canal narrowing. 2. At L4-5 there is a moderate broad-based disc bulge. Severe bilateral facet arthropathy with ligamentum flavum infolding resulting in mild spinal stenosis and severe left lateral recess stenosis. There is a 10 x 17 mm left facet extra-spinal synovial cyst.   She reports that she  has never smoked. She has never used smokeless tobacco. No results for input(s): HGBA1C, LABURIC in the last 8760 hours.  Objective:  VS:  HT:5\' 7"  (170.2 cm)   WT:158 lb (71.7 kg)  BMI:24.74    BP:125/69  HR:82bpm  TEMP: ( )  RESP:  Physical Exam Vitals and nursing note reviewed.  Constitutional:      General: She is not in acute distress.    Appearance: Normal appearance. She is well-developed.  HENT:     Head: Normocephalic and atraumatic.     Nose: Nose normal.     Mouth/Throat:     Mouth: Mucous membranes are moist.     Pharynx: Oropharynx is clear.  Eyes:     Conjunctiva/sclera: Conjunctivae normal.     Pupils: Pupils are equal, round, and reactive to light.  Cardiovascular:  Rate and Rhythm: Regular rhythm.  Pulmonary:     Effort: Pulmonary effort is normal. No respiratory distress.  Abdominal:     General: There is no distension.     Palpations: Abdomen is soft.     Tenderness: There is no guarding.  Musculoskeletal:     Cervical back: Normal range of motion and neck supple.     Right lower leg: No edema.     Left lower leg: No edema.     Comments: Patient sits with a forward flexed cervical spine.  She goes from sit to stand with some difficulty in full extension and clearly has concordant low back pain with facet loading of the lumbar spine.  No focal trigger points noted.  Some pain over the right upper buttock lateral hip but not really over the greater trochanter.  No pain with hip rotation.  She has good distal strength without clonus.  There is decreased sensation over the left lateral upper thigh.  Skin:    General: Skin is warm and dry.     Findings: No erythema or rash.  Neurological:     General: No focal deficit present.     Mental Status: She is alert and oriented to person, place, and time.     Motor: No abnormal muscle tone.     Coordination: Coordination normal.     Gait: Gait normal.  Psychiatric:        Mood and Affect: Mood normal.         Behavior: Behavior normal.        Thought Content: Thought content normal.     Ortho Exam Imaging: No results found.  Past Medical/Family/Surgical/Social History: Medications & Allergies reviewed per EMR, new medications updated. Patient Active Problem List   Diagnosis Date Noted  . Spondylosis without myelopathy or radiculopathy, lumbar region 03/14/2019  . Chest tightness 10/05/2018  . Primary osteoarthritis of both knees 12/09/2016  . Plantar fasciitis 12/09/2016  . Primary osteoarthritis of both feet 12/09/2016  . Primary osteoarthritis of both hands 12/09/2016  . DDD (degenerative disc disease), lumbar 12/09/2016  . Lateral epicondylitis, right elbow 12/09/2016  . Lumbar radiculopathy 02/25/2016  . DDD (degenerative disc disease), cervical 10/19/2012  . PVCs (premature ventricular contractions) 03/12/2011  . Vitamin D deficiency 01/01/2008  . DJD (degenerative joint disease), multiple sites 01/01/2008   Past Medical History:  Diagnosis Date  . Allergy   . Arthritis   . Cancer ([place]) 2019   basal cell on chest and back  . Cyst    hip; probable epidemoid inclusion cyst; resolution with antibiotics  . Right knee meniscal tear  07/2011   Dr Hal Morales, Ortho; S/P cortisone injection  . Spinal stenosis at L4-L5 level 2017   bilateral  . Tear of left acetabular labrum 2006 ?   Dr Sallyanne Havers  . Vitamin D deficiency    Family History  Problem Relation Age of Onset  . Parkinsonism Father   . Colon polyps Father        No colon cancer  . Transient ischemic attack Father   . Breast cancer Mother 49  . Coronary artery disease Mother        ? radiation related  . Arthritis Mother        DJD  . Arthritis Maternal Grandmother        DJD  . Diabetes Maternal Grandfather   . Heart attack Neg Hx    Past Surgical History:  Procedure Laterality Date  . ANKLE SURGERY  1989    Post fracture   . DILATION AND CURETTAGE OF UTERUS  1988  . FOOT SURGERY     left- bone spurs, Dr Mayer Camel   . HAND SURGERY     Thumb surgery x 2  . KNEE ARTHROPLASTY    . KNEE ARTHROSCOPY Right 2013  . LAPAROTOMY  1988   For Dysmenorrhea   Social History   Occupational History  . Not on file  Tobacco Use  . Smoking status: Never Smoker  . Smokeless tobacco: Never Used  Substance and Sexual Activity  . Alcohol use: Yes    Alcohol/week: 5.0 standard drinks    Types: 5 Glasses of wine per week    Comment: social  . Drug use: No  . Sexual activity: Not Currently    Partners: Male

## 2019-03-20 ENCOUNTER — Encounter: Payer: BC Managed Care – PPO | Admitting: Physical Medicine and Rehabilitation

## 2019-03-23 ENCOUNTER — Ambulatory Visit: Payer: Self-pay | Admitting: Rheumatology

## 2019-04-03 ENCOUNTER — Encounter: Payer: BC Managed Care – PPO | Admitting: Physical Medicine and Rehabilitation

## 2019-05-07 ENCOUNTER — Ambulatory Visit: Payer: BC Managed Care – PPO | Admitting: Obstetrics and Gynecology

## 2019-05-07 ENCOUNTER — Other Ambulatory Visit: Payer: Self-pay

## 2019-05-07 ENCOUNTER — Encounter: Payer: Self-pay | Admitting: Obstetrics and Gynecology

## 2019-05-07 VITALS — BP 120/80 | HR 66 | Temp 97.0°F | Resp 14 | Ht 67.0 in | Wt 158.1 lb

## 2019-05-07 DIAGNOSIS — Z01419 Encounter for gynecological examination (general) (routine) without abnormal findings: Secondary | ICD-10-CM

## 2019-05-07 MED ORDER — ESTRADIOL 0.05 MG/24HR TD PTTW: 1.0000 | MEDICATED_PATCH | TRANSDERMAL | 3 refills | Status: DC

## 2019-05-07 MED ORDER — PROGESTERONE MICRONIZED 100 MG PO CAPS: 100.0000 mg | ORAL_CAPSULE | Freq: Every day | ORAL | 3 refills | Status: DC

## 2019-05-07 NOTE — Patient Instructions (Signed)

## 2019-05-07 NOTE — Progress Notes (Signed)
59 y.o. G0P0000 Single Caucasian female here for annual exam.    Doing well on HRT.  No more hot flashes at night. She wants to continue. No vaginal bleeding.  She is not sleeping as well since she stopped gabapentin.  Feels ok during the day.   Does not have outbreaks of HSV.   Had right knee replacement in September and now has good range of motion.  Now needs another left knee replacement.   PCP:   Billey Gosling, MD   No LMP recorded. (Menstrual status: Irregular Periods).        Sexually active: No.  The current method of family planning is abstinence.    Exercising: No.  The patient does not participate in regular exercise at present. Smoker:  no  Health Maintenance: Pap:  04-22-17 negative, HR HPV negative           03-18-14 negative, HR HPV negative  History of abnormal Pap:  no MMG:  08-22-2018 density B/BIRADS 1 negative  Colonoscopy:  05-25-13, negative, repeat in 7 years  BMD:   no  Result  n/a TDaP:  12-16-15  HIV: 05-31-12 negative  Hep C: 05-31-12 negative  Screening Labs:  Hb today: 09-28-2018- in Care Everywhere, Urine today: not collected Flu vaccine:  Completed.   reports that she has never smoked. She has never used smokeless tobacco. She reports current alcohol use of about 5.0 standard drinks of alcohol per week. She reports that she does not use drugs.  Past Medical History:  Diagnosis Date  . Allergy   . Arthritis   . Cancer ([place]) 2019   basal cell on chest and back  . Cyst    hip; probable epidemoid inclusion cyst; resolution with antibiotics  . Right knee meniscal tear  07/2011   Dr Hal Morales, Ortho; S/P cortisone injection  . Spinal stenosis at L4-L5 level 2017   bilateral  . Tear of left acetabular labrum 2006 ?   Dr Sallyanne Havers  . Vitamin D deficiency     Past Surgical History:  Procedure Laterality Date  . ANKLE SURGERY  1989    Post fracture   . DILATION AND CURETTAGE OF UTERUS  1988  . FOOT SURGERY     left- bone spurs, Dr Mayer Camel  . HAND SURGERY      Thumb surgery x 2  . KNEE ARTHROPLASTY    . KNEE ARTHROSCOPY Right 2013  . LAPAROTOMY  1988   For Dysmenorrhea    Current Outpatient Medications  Medication Sig Dispense Refill  . acetaminophen (TYLENOL) 500 MG tablet Take by mouth.    . Cholecalciferol (VITAMIN D3) 2000 UNITS TABS Take 5,000 Units by mouth daily.      . Cyanocobalamin (VITAMIN B-12 PO) Take by mouth daily.    . diclofenac sodium (VOLTAREN) 1 % GEL Apply 4 g topically 4 (four) times daily. (Patient taking differently: Apply 4 g topically as needed. ) 100 g 3  . estradiol (VIVELLE-DOT) 0.05 MG/24HR patch Place 1 patch (0.05 mg total) onto the skin 2 (two) times a week. 24 patch 1  . fexofenadine (ALLEGRA) 180 MG tablet Take 180 mg by mouth as needed.     . Misc Natural Products (TART CHERRY ADVANCED PO) Take by mouth.    . progesterone (PROMETRIUM) 100 MG capsule Take 1 capsule (100 mg total) by mouth daily. 90 capsule 2  . TURMERIC PO Take by mouth daily.    . Zinc 25 MG TABS Take 1 tablet by mouth daily.  20MG      No current facility-administered medications for this visit.    Family History  Problem Relation Age of Onset  . Parkinsonism Father   . Colon polyps Father        No colon cancer  . Transient ischemic attack Father   . Breast cancer Mother 81  . Coronary artery disease Mother        ? radiation related  . Arthritis Mother        DJD  . Arthritis Maternal Grandmother        DJD  . Diabetes Maternal Grandfather   . Heart attack Neg Hx     Review of Systems  Constitutional: Negative.   HENT: Negative.   Eyes: Negative.   Respiratory: Negative.   Cardiovascular: Negative.   Gastrointestinal: Negative.   Endocrine: Negative.   Genitourinary: Negative.   Musculoskeletal: Negative.   Skin: Negative.   Allergic/Immunologic: Negative.   Neurological: Negative.   Hematological: Negative.   Psychiatric/Behavioral: Positive for sleep disturbance.    Exam:   BP 120/80 (BP Location: Right  Arm, Patient Position: Sitting, Cuff Size: Normal)   Pulse 66   Temp (!) 97 F (36.1 C) (Skin)   Resp 14   Ht 5\' 7"  (1.702 m)   Wt 158 lb 1.6 oz (71.7 kg)   BMI 24.76 kg/m     General appearance: alert, cooperative and appears stated age Head: normocephalic, without obvious abnormality, atraumatic Neck: no adenopathy, supple, symmetrical, trachea midline and thyroid normal to inspection and palpation Lungs: clear to auscultation bilaterally Breasts: normal appearance, no masses or tenderness, No nipple retraction or dimpling, No nipple discharge or bleeding, No axillary adenopathy Heart: regular rate and rhythm Abdomen: soft, non-tender; no masses, no organomegaly Extremities: extremities normal, atraumatic, no cyanosis or edema Skin: skin color, texture, turgor normal. No rashes or lesions Lymph nodes: cervical, supraclavicular, and axillary nodes normal. Neurologic: grossly normal  Pelvic: External genitalia:  no lesions              No abnormal inguinal nodes palpated.              Urethra:  normal appearing urethra with no masses, tenderness or lesions              Bartholins and Skenes: normal                 Vagina: normal appearing vagina with normal color and discharge, no lesions              Cervix: no lesions              Pap taken: No. Bimanual Exam:  Uterus:  normal size, contour, position, consistency, mobility, non-tender              Adnexa: no mass, fullness, tenderness              Rectal exam: Yes.  .  Confirms.              Anus:  normal sphincter tone, no lesions  Chaperone was present for exam.  Assessment:   Well woman visit with normal exam. Menopausal symptoms.  Controlled on HRT. Fibroids.  Cervical stenosis.  FH post menopausal breast cancer. Hx HSV 2.   Plan: Mammogram screening discussed. Self breast awareness reviewed. Pap and HR HPV as above. Guidelines for Calcium, Vitamin D, regular exercise program including cardiovascular and weight  bearing exercise. Refill of Vivelle Dot 0.05 mg and Prometrium 100 mg.  3 months and 3 refills.   Discused WHI and use of HRT which can increase risk of PE, DVT, MI, stroke and breast cancer.  She wishes to continue HRT.  Follow up annually and prn.   After visit summary provided.

## 2019-05-08 ENCOUNTER — Encounter: Payer: Self-pay | Admitting: Rheumatology

## 2019-07-12 NOTE — Progress Notes (Deleted)
Office Visit Note  Patient: Michele Bennett             Date of Birth: [birth year]           MRN: [id number]             PCP: Binnie Rail, MD Referring: Binnie Rail, MD Visit Date: 07/19/2019 Occupation: @GUAROCC @  Subjective:  No chief complaint on file.   History of Present Illness: Michele Bennett is a 59 y.o. female ***   Activities of Daily Living:  Patient reports morning stiffness for *** {minute/hour:19697}.   Patient {ACTIONS;DENIES/REPORTS:21021675::"Denies"} nocturnal pain.  Difficulty dressing/grooming: {ACTIONS;DENIES/REPORTS:21021675::"Denies"} Difficulty climbing stairs: {ACTIONS;DENIES/REPORTS:21021675::"Denies"} Difficulty getting out of chair: {ACTIONS;DENIES/REPORTS:21021675::"Denies"} Difficulty using hands for taps, buttons, cutlery, and/or writing: {ACTIONS;DENIES/REPORTS:21021675::"Denies"}  No Rheumatology ROS completed.   PMFS History:  Patient Active Problem List   Diagnosis Date Noted  . Spondylosis without myelopathy or radiculopathy, lumbar region 03/14/2019  . Chest tightness 10/05/2018  . Primary osteoarthritis of both knees 12/09/2016  . Plantar fasciitis 12/09/2016  . Primary osteoarthritis of both feet 12/09/2016  . Primary osteoarthritis of both hands 12/09/2016  . DDD (degenerative disc disease), lumbar 12/09/2016  . Lateral epicondylitis, right elbow 12/09/2016  . Lumbar radiculopathy 02/25/2016  . DDD (degenerative disc disease), cervical 10/19/2012  . PVCs (premature ventricular contractions) 03/12/2011  . Vitamin D deficiency 01/01/2008  . DJD (degenerative joint disease), multiple sites 01/01/2008    Past Medical History:  Diagnosis Date  . Allergy   . Arthritis   . Cancer ([place]) 2019   basal cell on chest and back  . Cyst    hip; probable epidemoid inclusion cyst; resolution with antibiotics  . Right knee meniscal tear  07/2011   Dr Hal Morales, Ortho; S/P cortisone injection  . Spinal stenosis at L4-L5  level 2017   bilateral  . Tear of left acetabular labrum 2006 ?   Dr Sallyanne Havers  . Vitamin D deficiency     Family History  Problem Relation Age of Onset  . Parkinsonism Father   . Colon polyps Father        No colon cancer  . Transient ischemic attack Father   . Breast cancer Mother 43  . Coronary artery disease Mother        ? radiation related  . Arthritis Mother        DJD  . Arthritis Maternal Grandmother        DJD  . Diabetes Maternal Grandfather   . Heart attack Neg Hx    Past Surgical History:  Procedure Laterality Date  . ANKLE SURGERY  1989    Post fracture   . DILATION AND CURETTAGE OF UTERUS  1988  . FOOT SURGERY     left- bone spurs, Dr Mayer Camel  . HAND SURGERY     Thumb surgery x 2  . KNEE ARTHROPLASTY    . KNEE ARTHROSCOPY Right 2013  . 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  Walks dog, gardening, PT for back   Immunization History  Administered Date(s) Administered  . Influenza Split 12/21/2011  . Influenza Whole 12/20/2007, 12/09/2008  . Influenza,inj,Quad PF,6+ Mos 12/20/2012, 01/02/2015, 12/16/2015, 12/09/2016, 01/10/2018, 12/22/2018  . Td 10/04/2005  . Tdap 12/16/2015  . Zoster Recombinat (Shingrix) 01/10/2018, 03/17/2018     Objective: Vital Signs: There were no vitals taken for this visit.   Physical Exam   Musculoskeletal Exam: ***  CDAI Exam: CDAI Score: -- Patient Global: --; Provider Global: -- Swollen: --; Tender: -- Joint Exam 07/19/2019   No joint exam has been documented for this visit   There is currently no information documented on the homunculus. Go to the Rheumatology activity and complete the homunculus joint exam.  Investigation: No additional findings.  Imaging: No results found.  Recent Labs: Lab Results  Component Value Date   WBC 6.0 04/22/2017   HGB 13.4 04/22/2017   PLT 315 04/22/2017   NA 140 07/28/2017   K 4.3 07/28/2017    CL 105 07/28/2017   CO2 28 07/28/2017   GLUCOSE 91 07/28/2017   BUN 16 07/28/2017   CREATININE 0.89 07/28/2017   BILITOT 0.6 10/06/2018   ALKPHOS 61 10/06/2018   AST 15 10/06/2018   ALT 14 10/06/2018   PROT 6.8 10/06/2018   ALBUMIN 4.3 10/06/2018   CALCIUM 9.3 07/28/2017   GFRAA 87 12/14/2016    Speciality Comments: No specialty comments available.  Procedures:  No procedures performed Allergies: Patient has no known allergies.   Assessment / Plan:     Visit Diagnoses: No diagnosis found.  Orders: No orders of the defined types were placed in this encounter.  No orders of the defined types were placed in this encounter.   Face-to-face time spent with patient was *** minutes. Greater than 50% of time was spent in counseling and coordination of care.  Follow-Up Instructions: No follow-ups on file.   Ofilia Neas, PA-C  Note - This record has been created using Dragon software.  Chart creation errors have been sought, but may not always  have been located. Such creation errors do not reflect on  the standard of medical care.

## 2019-07-16 ENCOUNTER — Other Ambulatory Visit: Payer: Self-pay | Admitting: Obstetrics and Gynecology

## 2019-07-16 DIAGNOSIS — Z1231 Encounter for screening mammogram for malignant neoplasm of breast: Secondary | ICD-10-CM

## 2019-07-19 ENCOUNTER — Ambulatory Visit: Payer: BC Managed Care – PPO | Admitting: Rheumatology

## 2019-07-20 ENCOUNTER — Other Ambulatory Visit: Payer: Self-pay | Admitting: Obstetrics and Gynecology

## 2019-08-23 ENCOUNTER — Ambulatory Visit: Payer: BC Managed Care – PPO

## 2019-08-29 ENCOUNTER — Other Ambulatory Visit: Payer: Self-pay

## 2019-08-29 ENCOUNTER — Ambulatory Visit
Admission: RE | Admit: 2019-08-29 | Discharge: 2019-08-29 | Disposition: A | Discharge status: Home / Self Care | Payer: BC Managed Care – PPO | Source: Ambulatory Visit | Attending: Obstetrics and Gynecology | Admitting: Obstetrics and Gynecology

## 2019-08-29 DIAGNOSIS — Z1231 Encounter for screening mammogram for malignant neoplasm of breast: Secondary | ICD-10-CM

## 2019-10-25 NOTE — Progress Notes (Signed)
Office Visit Note  Patient: Michele Bennett             Date of Birth: [birth year]           MRN: [id number]             PCP: Binnie Rail, MD Referring: Binnie Rail, MD Visit Date: 11/08/2019 Occupation: @GUAROCC @  Subjective:  Pain in both hands   History of Present Illness: Michele Bennett is a 59 y.o. female with history of osteoarthritis.  She states she had her right knee replaced in September 2020 and has her yearly follow up visit in a few weeks.  She states her discomfort continues to improve.  She is still unable to kneel. She has persistent pain in the left knee joint, but she is not ready to proceed with a knee replacement at this time.  She did not noticed much improvement with the visco gel injections performed in November 2020.  She does not want to proceed with another round of gel injections at this time. She presents today with increased pain in both hands.  She has been experiencing intermittent joint swelling and stiffness in her hands.  She has had some increased discomfort with ADLs. She has also been experiencing discomfort and a popping sensation in the right elbow, which is exacerbated by using a screwdriver (twisting motion). She continues to take natural antiinflammatories.  She has noticed intermittent numbness and tingling in her feet.    Activities of Daily Living:  Patient reports morning stiffness for 1  hour.   Patient Reports nocturnal pain.  Difficulty dressing/grooming: Denies Difficulty climbing stairs: Reports Difficulty getting out of chair: Denies Difficulty using hands for taps, buttons, cutlery, and/or writing: Reports  Review of Systems  Constitutional: Negative for fatigue.  HENT: Positive for mouth dryness. Negative for mouth sores and nose dryness.   Eyes: Positive for itching and dryness.  Respiratory: Negative for shortness of breath and difficulty breathing.   Cardiovascular: Negative for chest pain and  palpitations.  Gastrointestinal: Negative for blood in stool, constipation and diarrhea.  Endocrine: Negative for increased urination.  Genitourinary: Negative for difficulty urinating.  Musculoskeletal: Positive for arthralgias, joint pain, joint swelling, myalgias, muscle weakness, morning stiffness, muscle tenderness and myalgias.  Skin: Negative for color change, rash and redness.  Allergic/Immunologic: Negative for susceptible to infections.  Neurological: Positive for numbness, headaches and weakness. Negative for dizziness and memory loss.  Hematological: Positive for bruising/bleeding tendency.  Psychiatric/Behavioral: Negative for confusion.    PMFS History:  Patient Active Problem List   Diagnosis Date Noted  . Spondylosis without myelopathy or radiculopathy, lumbar region 03/14/2019  . Chest tightness 10/05/2018  . Primary osteoarthritis of both knees 12/09/2016  . Plantar fasciitis 12/09/2016  . Primary osteoarthritis of both feet 12/09/2016  . Primary osteoarthritis of both hands 12/09/2016  . DDD (degenerative disc disease), lumbar 12/09/2016  . Lateral epicondylitis, right elbow 12/09/2016  . Lumbar radiculopathy 02/25/2016  . DDD (degenerative disc disease), cervical 10/19/2012  . PVCs (premature ventricular contractions) 03/12/2011  . Vitamin D deficiency 01/01/2008  . DJD (degenerative joint disease), multiple sites 01/01/2008    Past Medical History:  Diagnosis Date  . Allergy   . Arthritis   . Cancer ([facility]) 2019   basal cell on chest and back  . Cyst    hip; probable epidemoid inclusion cyst; resolution with antibiotics  . Right knee meniscal tear  07/2011   Dr Hal Morales, Ortho; S/P cortisone injection  .  Spinal stenosis at L4-L5 level 2017   bilateral  . Tear of left acetabular labrum 2006 ?   Dr Sallyanne Havers  . Vitamin D deficiency     Family History  Problem Relation Age of Onset  . Parkinsonism Father   . Colon polyps Father        No colon cancer  .  Transient ischemic attack Father   . Breast cancer Mother 57  . Coronary artery disease Mother        ? radiation related  . Arthritis Mother        DJD  . Kidney disease Mother   . Arthritis Maternal Grandmother        DJD  . Diabetes Maternal Grandfather   . Heart attack Neg Hx    Past Surgical History:  Procedure Laterality Date  . ANKLE SURGERY  1989    Post fracture   . DILATION AND CURETTAGE OF UTERUS  1988  . FOOT SURGERY     left- bone spurs, Dr Mayer Camel  . HAND SURGERY     Thumb surgery x 2  . KNEE ARTHROPLASTY    . KNEE ARTHROSCOPY Right 2013  . LAPAROTOMY  1988   For Dysmenorrhea  . 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  Walks dog, gardening, PT for back   Immunization History  Administered Date(s) Administered  . Influenza Split 12/21/2011  . Influenza Whole 12/20/2007, 12/09/2008  . Influenza,inj,Quad PF,6+ Mos 12/20/2012, 01/02/2015, 12/16/2015, 12/09/2016, 01/10/2018, 12/22/2018  . PFIZER SARS-COV-2 Vaccination 05/25/2019, 06/15/2019  . Td 10/04/2005  . Tdap 12/16/2015  . Zoster Recombinat (Shingrix) 01/10/2018, 03/17/2018     Objective: Vital Signs: BP 125/72 (BP Location: Left Arm, Patient Position: Sitting, Cuff Size: Normal)   Pulse (!) 57   Resp 14   Ht 5\' 7"  (1.702 m)   Wt 160 lb 6.4 oz (72.8 kg)   LMP 02/23/2018 (Approximate)   BMI 25.12 kg/m    Physical Exam Vitals and nursing note reviewed.  Constitutional:      Appearance: She is well-developed.  HENT:     Head: Normocephalic and atraumatic.  Eyes:     Conjunctiva/sclera: Conjunctivae normal.  Pulmonary:     Effort: Pulmonary effort is normal.  Abdominal:     Palpations: Abdomen is soft.  Musculoskeletal:     Cervical back: Normal range of motion.  Lymphadenopathy:     Cervical: No cervical adenopathy.  Skin:    General: Skin is warm and dry.     Capillary Refill: Capillary refill takes less than 2  seconds.  Neurological:     Mental Status: She is alert and oriented to person, place, and time.  Psychiatric:        Behavior: Behavior normal.      Musculoskeletal Exam: C-spine limited ROM. Thoracic spine and lumbar spine good ROM.  Shoulder joints, elbow joints, wrist joints, MCPs, PIPs, and DIPs good ROM. Tenderness over the right elbow joint line.  PIP and DIP thickening consistent with osteoarthritis of both hands. Tenderness over the right CMC joint and 1st MCP joint.  Hip joints good ROM with no discomfort.  Right knee replacement has good ROM with warmth.  Left knee has good ROM with no warmth or effusion.  Thickening of the right ankle joint. bunions on both 1st MTP joints.  PIP and DIP thickening consistent with osteoarthritis of both hands.   CDAI Exam: CDAI Score: --  Patient Global: --; Provider Global: -- Swollen: --; Tender: -- Joint Exam 11/08/2019   No joint exam has been documented for this visit   There is currently no information documented on the homunculus. Go to the Rheumatology activity and complete the homunculus joint exam.  Investigation: No additional findings.  Imaging: No results found.  Recent Labs: Lab Results  Component Value Date   WBC 6.0 04/22/2017   HGB 13.4 04/22/2017   PLT 315 04/22/2017   NA 140 07/28/2017   K 4.3 07/28/2017   CL 105 07/28/2017   CO2 28 07/28/2017   GLUCOSE 91 07/28/2017   BUN 16 07/28/2017   CREATININE 0.89 07/28/2017   BILITOT 0.6 10/06/2018   ALKPHOS 61 10/06/2018   AST 15 10/06/2018   ALT 14 10/06/2018   PROT 6.8 10/06/2018   ALBUMIN 4.3 10/06/2018   CALCIUM 9.3 07/28/2017   GFRAA 87 12/14/2016    Speciality Comments: No specialty comments available.  Procedures:  No procedures performed Allergies: Patient has no known allergies.   Assessment / Plan:     Visit Diagnoses: Primary osteoarthritis of both hands: She has PIP and DIP thickening consistent with osteoarthritis of both hands.  CMC joint  prominence noted bilaterally.  She has tenderness of the right CMC and first MCP joint.  We discussed the importance of joint protection and muscle strengthening.  She was given a handout of hand exercises to perform.  She was advised to notify us if she develops increased joint pain or joint swelling.  She was encouraged to continue to take natural anti-inflammatories and use Voltaren gel topically as needed for pain relief.  She will follow-up in the office in 6 months.  Primary osteoarthritis of left knee - s/p synvisc left knee 01/2019-02/2019: She continues to have chronic pain in the left knee joint.  She is not ready to proceed with a knee replacement at this time.  She did not have much relief after having gel injections in November 2020.  She does not want to reapply for Visco gel injections at this time.  Status post total knee replacement, right - November 21, 2018 by Dr. Margretta Sidle at [facility].  Doing well.  She has good range of motion with no discomfort.  Warmth but no effusion was noted.  Primary osteoarthritis of both feet: She has PIP and DIP thickening consistent with osteoarthritis of both feet.  Bunions of both first MTP joints noted.  We discussed the importance of wearing proper fitting shoes.  DDD (degenerative disc disease), cervical: She has limited range of motion on exam.  Patient reports that she has cervical spinal stenosis which causes intermittent discomfort and stiffness.  DDD (degenerative disc disease), lumbar: Chronic pain.  She is not experiencing any symptoms of radiculopathy at this time.  Trigger middle finger of right hand: Resolved.  Long term (current) use of non-steroidal anti-inflammatories (nsaid): She previously was taking meloxicam but discontinued due to GI side effects.  She takes Advil sparingly for pain relief.  We discussed trying to avoid the use of NSAIDs and instead taking Tylenol as needed for pain relief.  She can also use Voltaren gel  topically as needed for pain relief.  Plantar fasciitis: Resolved.  Vitamin D deficiency: Vitamin D was 36 on 12/14/2016.  She is taking vitamin D 5000 units daily.  Lateral epicondylitis, right elbow: Resolved.  She has no tenderness to palpation.  She does have tenderness along the right elbow joint line.  She was encouraged  to use Voltaren gel topically as needed for pain relief.  Encounter for osteoporosis screening in asymptomatic postmenopausal patient -A future order for DEXA was placed today.  She is postmenopausal and has not had a baseline DEXA scan.  She is taking vitamin D 5000 units daily.  Plan: DG BONE DENSITY (DXA)  Patient is fully vaccinated against COVID-19.  Use of mask, social distancing and hand hygiene was emphasized.  She was also advised to get booster when it is available for her.  Orders: Orders Placed This Encounter  Procedures  . DG BONE DENSITY (DXA)   No orders of the defined types were placed in this encounter.   Face-to-face time spent with patient was 30 minutes. Greater than 50% of time was spent in counseling and coordination of care.  Follow-Up Instructions: Return in about 6 months (around 05/10/2020) for Osteoarthritis, DDD.   Bo Merino, MD   Scribed by-  Hazel Sams ,PA-C  Note - This record has been created using Dragon software.  Chart creation errors have been sought, but may not always  have been located. Such creation errors do not reflect on  the standard of medical care.

## 2019-11-08 ENCOUNTER — Other Ambulatory Visit: Payer: Self-pay

## 2019-11-08 ENCOUNTER — Ambulatory Visit: Payer: BC Managed Care – PPO | Admitting: Rheumatology

## 2019-11-08 ENCOUNTER — Encounter: Payer: Self-pay | Admitting: Rheumatology

## 2019-11-08 VITALS — BP 125/72 | HR 57 | Resp 14 | Ht 67.0 in | Wt 160.4 lb

## 2019-11-08 DIAGNOSIS — M1712 Unilateral primary osteoarthritis, left knee: Secondary | ICD-10-CM | POA: Diagnosis not present

## 2019-11-08 DIAGNOSIS — M65331 Trigger finger, right middle finger: Secondary | ICD-10-CM

## 2019-11-08 DIAGNOSIS — M5030 Other cervical disc degeneration, unspecified cervical region: Secondary | ICD-10-CM

## 2019-11-08 DIAGNOSIS — M19071 Primary osteoarthritis, right ankle and foot: Secondary | ICD-10-CM

## 2019-11-08 DIAGNOSIS — Z96651 Presence of right artificial knee joint: Secondary | ICD-10-CM | POA: Diagnosis not present

## 2019-11-08 DIAGNOSIS — M7711 Lateral epicondylitis, right elbow: Secondary | ICD-10-CM

## 2019-11-08 DIAGNOSIS — M19041 Primary osteoarthritis, right hand: Secondary | ICD-10-CM | POA: Diagnosis not present

## 2019-11-08 DIAGNOSIS — M19042 Primary osteoarthritis, left hand: Secondary | ICD-10-CM

## 2019-11-08 DIAGNOSIS — E559 Vitamin D deficiency, unspecified: Secondary | ICD-10-CM

## 2019-11-08 DIAGNOSIS — Z13820 Encounter for screening for osteoporosis: Secondary | ICD-10-CM

## 2019-11-08 DIAGNOSIS — Z780 Asymptomatic menopausal state: Secondary | ICD-10-CM

## 2019-11-08 DIAGNOSIS — M722 Plantar fascial fibromatosis: Secondary | ICD-10-CM

## 2019-11-08 DIAGNOSIS — M19072 Primary osteoarthritis, left ankle and foot: Secondary | ICD-10-CM

## 2019-11-08 DIAGNOSIS — M5136 Other intervertebral disc degeneration, lumbar region: Secondary | ICD-10-CM

## 2019-11-08 DIAGNOSIS — Z791 Long term (current) use of non-steroidal anti-inflammatories (NSAID): Secondary | ICD-10-CM

## 2019-11-08 NOTE — Patient Instructions (Addendum)
Hand Exercises Hand exercises can be helpful for almost anyone. These exercises can strengthen the hands, improve flexibility and movement, and increase blood flow to the hands. These results can make work and daily tasks easier. Hand exercises can be especially helpful for people who have joint pain from arthritis or have nerve damage from overuse (carpal tunnel syndrome). These exercises can also help people who have injured a hand. Exercises Most of these hand exercises are gentle stretching and motion exercises. It is usually safe to do them often throughout the day. Warming up your hands before exercise may help to reduce stiffness. You can do this with gentle massage or by placing your hands in warm water for 10-15 minutes. It is normal to feel some stretching, pulling, tightness, or mild discomfort as you begin new exercises. This will gradually improve. Stop an exercise right away if you feel sudden, severe pain or your pain gets worse. Ask your health care provider which exercises are best for you. Knuckle bend or "claw" fist 1. Stand or sit with your arm, hand, and all five fingers pointed straight up. Make sure to keep your wrist straight during the exercise. 2. Gently bend your fingers down toward your palm until the tips of your fingers are touching the top of your palm. Keep your big knuckle straight and just bend the small knuckles in your fingers. 3. Hold this position for __________ seconds. 4. Straighten (extend) your fingers back to the starting position. Repeat this exercise 5-10 times with each hand. Full finger fist 1. Stand or sit with your arm, hand, and all five fingers pointed straight up. Make sure to keep your wrist straight during the exercise. 2. Gently bend your fingers into your palm until the tips of your fingers are touching the middle of your palm. 3. Hold this position for __________ seconds. 4. Extend your fingers back to the starting position, stretching every  joint fully. Repeat this exercise 5-10 times with each hand. Straight fist 1. Stand or sit with your arm, hand, and all five fingers pointed straight up. Make sure to keep your wrist straight during the exercise. 2. Gently bend your fingers at the big knuckle, where your fingers meet your hand, and the middle knuckle. Keep the knuckle at the tips of your fingers straight and try to touch the bottom of your palm. 3. Hold this position for __________ seconds. 4. Extend your fingers back to the starting position, stretching every joint fully. Repeat this exercise 5-10 times with each hand. Tabletop 1. Stand or sit with your arm, hand, and all five fingers pointed straight up. Make sure to keep your wrist straight during the exercise. 2. Gently bend your fingers at the big knuckle, where your fingers meet your hand, as far down as you can while keeping the small knuckles in your fingers straight. Think of forming a tabletop with your fingers. 3. Hold this position for __________ seconds. 4. Extend your fingers back to the starting position, stretching every joint fully. Repeat this exercise 5-10 times with each hand. Finger spread 1. Place your hand flat on a table with your palm facing down. Make sure your wrist stays straight as you do this exercise. 2. Spread your fingers and thumb apart from each other as far as you can until you feel a gentle stretch. Hold this position for __________ seconds. 3. Bring your fingers and thumb tight together again. Hold this position for __________ seconds. Repeat this exercise 5-10 times with each hand.   Making circles 1. Stand or sit with your arm, hand, and all five fingers pointed straight up. Make sure to keep your wrist straight during the exercise. 2. Make a circle by touching the tip of your thumb to the tip of your index finger. 3. Hold for __________ seconds. Then open your hand wide. 4. Repeat this motion with your thumb and each finger on your  hand. Repeat this exercise 5-10 times with each hand. Thumb motion 1. Sit with your forearm resting on a table and your wrist straight. Your thumb should be facing up toward the ceiling. Keep your fingers relaxed as you move your thumb. 2. Lift your thumb up as high as you can toward the ceiling. Hold for __________ seconds. 3. Bend your thumb across your palm as far as you can, reaching the tip of your thumb for the small finger (pinkie) side of your palm. Hold for __________ seconds. Repeat this exercise 5-10 times with each hand. Grip strengthening  1. Hold a stress ball or other soft ball in the middle of your hand. 2. Slowly increase the pressure, squeezing the ball as much as you can without causing pain. Think of bringing the tips of your fingers into the middle of your palm. All of your finger joints should bend when doing this exercise. 3. Hold your squeeze for __________ seconds, then relax. Repeat this exercise 5-10 times with each hand. Contact a health care provider if:  Your hand pain or discomfort gets much worse when you do an exercise.  Your hand pain or discomfort does not improve within 2 hours after you exercise. If you have any of these problems, stop doing these exercises right away. Do not do them again unless your health care provider says that you can. Get help right away if:  You develop sudden, severe hand pain or swelling. If this happens, stop doing these exercises right away. Do not do them again unless your health care provider says that you can. This information is not intended to replace advice given to you by your health care provider. Make sure you discuss any questions you have with your health care provider. Document Revised: 06/22/2018 Document Reviewed: 03/02/2018 Elsevier Patient Education  2020 [employer]  

## 2019-11-30 ENCOUNTER — Encounter: Payer: Self-pay | Admitting: Rheumatology

## 2019-12-11 ENCOUNTER — Encounter: Payer: Self-pay | Admitting: Internal Medicine

## 2019-12-11 DIAGNOSIS — E041 Nontoxic single thyroid nodule: Secondary | ICD-10-CM

## 2019-12-12 DIAGNOSIS — E041 Nontoxic single thyroid nodule: Secondary | ICD-10-CM | POA: Insufficient documentation

## 2019-12-13 ENCOUNTER — Ambulatory Visit: Payer: BC Managed Care – PPO

## 2019-12-14 ENCOUNTER — Telehealth: Payer: Self-pay | Admitting: *Deleted

## 2019-12-14 NOTE — Telephone Encounter (Signed)
Received DEXA results from [facility].  Date of Scan: 12/12/2019 Lowest T-score and site measured: 0.9 Right Femoral Neck Significant changes in BMD and site measured (5% and above): n/a  Current Regimen: n/a  Recommendation: Repeat DEXA in 5 years.   Patient advised and expressed understanding.

## 2019-12-20 ENCOUNTER — Ambulatory Visit
Admission: RE | Admit: 2019-12-20 | Discharge: 2019-12-20 | Disposition: A | Discharge status: Home / Self Care | Payer: BC Managed Care – PPO | Source: Ambulatory Visit | Attending: Internal Medicine | Admitting: Internal Medicine

## 2019-12-20 DIAGNOSIS — E041 Nontoxic single thyroid nodule: Secondary | ICD-10-CM

## 2019-12-21 ENCOUNTER — Other Ambulatory Visit (INDEPENDENT_AMBULATORY_CARE_PROVIDER_SITE_OTHER): Payer: BC Managed Care – PPO

## 2019-12-21 ENCOUNTER — Ambulatory Visit (INDEPENDENT_AMBULATORY_CARE_PROVIDER_SITE_OTHER): Payer: BC Managed Care – PPO | Admitting: *Deleted

## 2019-12-21 ENCOUNTER — Other Ambulatory Visit: Payer: Self-pay

## 2019-12-21 DIAGNOSIS — E041 Nontoxic single thyroid nodule: Secondary | ICD-10-CM

## 2019-12-21 DIAGNOSIS — Z23 Encounter for immunization: Secondary | ICD-10-CM

## 2019-12-21 LAB — T4, FREE: Free T4: 0.91 ng/dL (ref 0.60–1.60)

## 2019-12-21 LAB — T3, FREE: T3, Free: 3.2 pg/mL (ref 2.3–4.2)

## 2019-12-21 LAB — TSH: TSH: 0.51 u[IU]/mL (ref 0.35–4.50)

## 2019-12-21 NOTE — Addendum Note (Signed)
Addended by: Boris Lown B on: 12/21/2019 10:12 AM   Modules accepted: Orders

## 2020-03-15 DIAGNOSIS — C44729 Squamous cell carcinoma of skin of left lower limb, including hip: Secondary | ICD-10-CM

## 2020-03-15 HISTORY — DX: Squamous cell carcinoma of skin of left lower limb, including hip: C44.729

## 2020-04-15 ENCOUNTER — Encounter: Payer: Self-pay | Admitting: Obstetrics and Gynecology

## 2020-04-16 ENCOUNTER — Other Ambulatory Visit: Payer: Self-pay | Admitting: Obstetrics and Gynecology

## 2020-04-16 MED ORDER — PROGESTERONE 200 MG PO CAPS: ORAL_CAPSULE | ORAL | 0 refills | Status: DC

## 2020-04-16 MED ORDER — ESTRADIOL 0.075 MG/24HR TD PTTW: 1.0000 | MEDICATED_PATCH | TRANSDERMAL | 0 refills | Status: DC

## 2020-04-24 NOTE — Progress Notes (Signed)
Office Visit Note  Patient: Michele Bennett             Date of Birth: Sep 23, 1960           MRN: [id number]             PCP: Binnie Rail, MD Referring: Binnie Rail, MD Visit Date: 05/08/2020 Occupation: @GUAROCC @  Subjective:  Other (Right hand pain and left knee pain )   History of Present Illness: Michele Bennett is a 60 y.o. female with a history of OA, DDD, and plantar fascitis. She has been managing her OA with natural anti-inflammatories including tart cherry. She has been taking Meloxicam as needed due to increased pain. She also takes Tylenol daily. She reports this has helped some with the pain. She does continue to have pain in her left knee and her right CMC joint. She has grip strength weakness and is unable to open jar. She has noticed a popping sensation. She has had some swelling in her left knee joint. X-ray of her right knee showed moderate to severe OA and severe chondromalacia on 04/03/2018. She had gel injections 01/2019 which did not provide any relief. She states she would like to try cortisone injections because the pain has gotten significantly worse. She has an orthopedic surgeon, but is not ready to undergo a total knee replacement at this time. She has tried PT in the past with limited relief. She has been active through walking and gardening. She has dry eyes and recently was seen by opthalmology. She has started an antihistamine eye drop which has been helping. She denies any new rashes. She has not had any symptoms of plantar fascitis.   Activities of Daily Living:  Patient reports morning stiffness for 15-20 minutes.   Patient Reports nocturnal pain.  Difficulty dressing/grooming: Denies Difficulty climbing stairs: Reports Difficulty getting out of chair: Reports Difficulty using hands for taps, buttons, cutlery, and/or writing: Reports  Review of Systems  Constitutional: Negative for fatigue.  HENT: Positive for mouth dryness.  Negative for mouth sores and nose dryness.   Eyes: Positive for itching and dryness. Negative for pain.  Respiratory: Negative for shortness of breath and difficulty breathing.   Cardiovascular: Negative for chest pain and palpitations.  Gastrointestinal: Negative for blood in stool, constipation and diarrhea.  Endocrine: Negative for increased urination.  Genitourinary: Negative for difficulty urinating.  Musculoskeletal: Positive for arthralgias, joint pain, joint swelling, muscle weakness and morning stiffness. Negative for myalgias, muscle tenderness and myalgias.  Skin: Negative for color change, rash and redness.  Allergic/Immunologic: Negative for susceptible to infections.  Neurological: Positive for numbness. Negative for dizziness, headaches, memory loss and weakness.  Hematological: Positive for bruising/bleeding tendency.  Psychiatric/Behavioral: Negative for confusion.    PMFS History:  Patient Active Problem List   Diagnosis Date Noted  . Thyroid nodule-benign-no follow-up needed 12/12/2019  . Spondylosis without myelopathy or radiculopathy, lumbar region 03/14/2019  . Chest tightness 10/05/2018  . Primary osteoarthritis of both knees 12/09/2016  . Plantar fasciitis 12/09/2016  . Primary osteoarthritis of both feet 12/09/2016  . Primary osteoarthritis of both hands 12/09/2016  . DDD (degenerative disc disease), lumbar 12/09/2016  . Lateral epicondylitis, right elbow 12/09/2016  . Lumbar radiculopathy 02/25/2016  . DDD (degenerative disc disease), cervical 10/19/2012  . PVCs (premature ventricular contractions) 03/12/2011  . Vitamin D deficiency 01/01/2008  . DJD (degenerative joint disease), multiple sites 01/01/2008    Past Medical History:  Diagnosis Date  . Allergy   .  Arthritis   . Cancer ([place]) 2019   basal cell on chest and back  . Cyst    hip; probable epidemoid inclusion cyst; resolution with antibiotics  . Right knee meniscal tear  07/2011   Dr Hal Morales,  Ortho; S/P cortisone injection  . Spinal stenosis at L4-L5 level 2017   bilateral  . Tear of left acetabular labrum 2006 ?   Dr Sallyanne Havers  . Thyroid nodule    dx by Dr. Maxie Better  . Vitamin D deficiency     Family History  Problem Relation Age of Onset  . Parkinsonism Father   . Colon polyps Father        No colon cancer  . Transient ischemic attack Father   . Breast cancer Mother 17  . Coronary artery disease Mother        ? radiation related  . Arthritis Mother        DJD  . Kidney disease Mother   . Arthritis Maternal Grandmother        DJD  . Diabetes Maternal Grandfather   . Heart attack Neg Hx    Past Surgical History:  Procedure Laterality Date  . ANKLE SURGERY  1989    Post fracture   . DILATION AND CURETTAGE OF UTERUS  1988  . FOOT SURGERY     left- bone spurs, Dr Mayer Camel  . HAND SURGERY     Thumb surgery x 2  . KNEE ARTHROPLASTY    . KNEE ARTHROSCOPY Right 2013  . LAPAROTOMY  1988   For Dysmenorrhea  . 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  Walks dog, gardening, PT for back   Immunization History  Administered Date(s) Administered  . Influenza Split 12/21/2011  . Influenza Whole 12/20/2007, 12/09/2008  . Influenza,inj,Quad PF,6+ Mos 12/20/2012, 01/02/2015, 12/16/2015, 12/09/2016, 01/10/2018, 12/22/2018, 12/21/2019  . PFIZER(Purple Top)SARS-COV-2 Vaccination 05/25/2019, 06/15/2019  . Td 10/04/2005  . Tdap 12/16/2015  . Zoster Recombinat (Shingrix) 01/10/2018, 03/17/2018     Objective: Vital Signs: BP 124/70 (BP Location: Left Arm, Patient Position: Sitting, Cuff Size: Normal)   Pulse 64   Resp 14   Ht 5\' 7"  (1.702 m)   Wt 160 lb 6.4 oz (72.8 kg)   LMP 02/23/2018 (Approximate)   BMI 25.12 kg/m    Physical Exam Constitutional:      Appearance: Normal appearance.  HENT:     Head: Normocephalic.  Cardiovascular:     Rate and Rhythm: Normal rate and regular rhythm.   Pulmonary:     Effort: Pulmonary effort is normal.  Abdominal:     Palpations: Abdomen is soft.  Skin:    General: Skin is warm and dry.  Neurological:     Mental Status: She is oriented to person, place, and time.  Psychiatric:        Mood and Affect: Mood normal.      Musculoskeletal Exam: Limited ROM of her c-spine. Good ROM of her elbow, wrist, and hand joints. She has tenderness of her lateral epicondyle on her right side. No synovitis of her MCP, PIP and DIP joints bilaterally.  Thickening of PIP and DIP joints and CMC joints was noted bilaterally.  Tenderness of CMC joint on her right hand. No signs of warmth of effusion in her knees with good ROM, bilaterally.  Right knee joint is replaced.  Good ROM of her ankles with no signs of synovitis.   CDAI Exam:  CDAI Score: - Patient Global: -; Provider Global: - Swollen: -; Tender: - Joint Exam 05/08/2020   No joint exam has been documented for this visit   There is currently no information documented on the homunculus. Go to the Rheumatology activity and complete the homunculus joint exam.  Investigation: No additional findings.  Imaging: No results found.  Recent Labs: Lab Results  Component Value Date   WBC 6.0 04/22/2017   HGB 13.4 04/22/2017   PLT 315 04/22/2017   NA 140 07/28/2017   K 4.3 07/28/2017   CL 105 07/28/2017   CO2 28 07/28/2017   GLUCOSE 91 07/28/2017   BUN 16 07/28/2017   CREATININE 0.89 07/28/2017   BILITOT 0.6 10/06/2018   ALKPHOS 61 10/06/2018   AST 15 10/06/2018   ALT 14 10/06/2018   PROT 6.8 10/06/2018   ALBUMIN 4.3 10/06/2018   CALCIUM 9.3 07/28/2017   GFRAA 87 12/14/2016    Speciality Comments: No specialty comments available.  Procedures:  No procedures performed Allergies: Patient has no known allergies.   Assessment / Plan:     Visit Diagnoses: Primary osteoarthritis of both hands-she has bilateral PIP and DIP thickening with no synovitis.  Joint protection and muscle  strengthening was discussed.  Arthritis of carpometacarpal (CMC) joint of right thumb-she has been having increased pain and discomfort in her right CMC joint.  I will refer her to Dr. Amedeo Plenty for evaluation.  Primary osteoarthritis of left knee - s/p synvisc left knee 01/2019-02/2019: She did not have much response to the Visco supplement injections.  She has been having left knee joint discomfort off and on.  She states the pain is bearable right now.  She understands that she has severe osteoarthritis in her left knee joint and will require total knee replacement in the future.  Although she would like to have cortisone injection prior to her hiking trip.  She will contact us before the trip.  Use of knee joint brace was advised.  Status post total knee replacement, right - November 21, 2018 by Dr. Margretta Sidle at [facility].  Primary osteoarthritis of both feet-currently not having much discomfort.  DDD (degenerative disc disease), cervical-she has been followed by Dr. Maxie Better.  She states Dr. Maxie Better did the MRI and discussed the findings with her.  She would like for me to review the MRI of her C-spine.  She will bring it at the next visit.  DDD (degenerative disc disease), lumbar-she continues to have some lower back pain.  Trigger middle finger of right hand - Resolved  Long term (current) use of non-steroidal anti-inflammatories (nsaid)-she has been taking meloxicam on as needed basis.  Have advised her to get CBC with differential and CMP with GFR with the PCP.  Side effects of long-term use of NSAIDs including GI bleed, elevation of LFTs and creatinine was also discussed.  Plantar fasciitis - Resolved.  Vitamin D deficiency-she is on vitamin D supplement.  Lateral epicondylitis, right elbow - Resolved.  She has mild tenderness on palpation.  Encounter for osteoporosis screening in asymptomatic postmenopausal patient - DEXA 12/12/2019 T-score: 0.9, BMD: 0.949 right femoral neck.   DEXA findings were discussed with the patient.  Use of calcium, vitamin D and resistive exercises were emphasized.  Orders: Orders Placed This Encounter  Procedures  . AMB referral to orthopedics   No orders of the defined types were placed in this encounter.   Follow-Up Instructions: Return in about 6 months (around 11/05/2020) for Osteoarthritis.   Abel Presto  Estanislado Pandy, MD  Note - This record has been created using Editor, commissioning.  Chart creation errors have been sought, but may not always  have been located. Such creation errors do not reflect on  the standard of medical care.

## 2020-05-07 ENCOUNTER — Ambulatory Visit: Payer: BC Managed Care – PPO | Admitting: Obstetrics and Gynecology

## 2020-05-08 ENCOUNTER — Encounter: Payer: Self-pay | Admitting: Rheumatology

## 2020-05-08 ENCOUNTER — Ambulatory Visit: Payer: BC Managed Care – PPO | Admitting: Rheumatology

## 2020-05-08 ENCOUNTER — Other Ambulatory Visit: Payer: Self-pay

## 2020-05-08 VITALS — BP 124/70 | HR 64 | Resp 14 | Ht 67.0 in | Wt 160.4 lb

## 2020-05-08 DIAGNOSIS — E559 Vitamin D deficiency, unspecified: Secondary | ICD-10-CM

## 2020-05-08 DIAGNOSIS — M1712 Unilateral primary osteoarthritis, left knee: Secondary | ICD-10-CM

## 2020-05-08 DIAGNOSIS — M19071 Primary osteoarthritis, right ankle and foot: Secondary | ICD-10-CM

## 2020-05-08 DIAGNOSIS — M7711 Lateral epicondylitis, right elbow: Secondary | ICD-10-CM

## 2020-05-08 DIAGNOSIS — Z791 Long term (current) use of non-steroidal anti-inflammatories (NSAID): Secondary | ICD-10-CM

## 2020-05-08 DIAGNOSIS — M1811 Unilateral primary osteoarthritis of first carpometacarpal joint, right hand: Secondary | ICD-10-CM

## 2020-05-08 DIAGNOSIS — Z780 Asymptomatic menopausal state: Secondary | ICD-10-CM

## 2020-05-08 DIAGNOSIS — M65331 Trigger finger, right middle finger: Secondary | ICD-10-CM

## 2020-05-08 DIAGNOSIS — M5030 Other cervical disc degeneration, unspecified cervical region: Secondary | ICD-10-CM

## 2020-05-08 DIAGNOSIS — M19072 Primary osteoarthritis, left ankle and foot: Secondary | ICD-10-CM

## 2020-05-08 DIAGNOSIS — M19041 Primary osteoarthritis, right hand: Secondary | ICD-10-CM

## 2020-05-08 DIAGNOSIS — M19042 Primary osteoarthritis, left hand: Secondary | ICD-10-CM

## 2020-05-08 DIAGNOSIS — M5136 Other intervertebral disc degeneration, lumbar region: Secondary | ICD-10-CM

## 2020-05-08 DIAGNOSIS — M722 Plantar fascial fibromatosis: Secondary | ICD-10-CM

## 2020-05-08 DIAGNOSIS — Z96651 Presence of right artificial knee joint: Secondary | ICD-10-CM

## 2020-05-08 DIAGNOSIS — Z13820 Encounter for screening for osteoporosis: Secondary | ICD-10-CM

## 2020-05-15 NOTE — Progress Notes (Addendum)
Subjective:    Patient ID: Michele Bennett, female    DOB: 02/01/1961, 60 y.o.   MRN: [id number]   This visit occurred during the SARS-CoV-2 public health emergency.  Safety protocols were in place, including screening questions prior to the visit, additional usage of staff PPE, and extensive cleaning of exam room while observing appropriate contact time as indicated for disinfecting solutions.    HPI She is here for a physical exam.   Thyroid nodule   sometimes choke when eat, nodules go up and down in size on [place].  She wonders if she needs further eval.   Knot in right upper back ? Bigger.  Mobile. No pain.     Medications and allergies reviewed with patient and updated if appropriate.  Patient Active Problem List   Diagnosis Date Noted  . Thyroid nodule-benign-no follow-up needed 12/12/2019  . Spondylosis without myelopathy or radiculopathy, lumbar region 03/14/2019  . Chest tightness 10/05/2018  . Primary osteoarthritis of both knees 12/09/2016  . Plantar fasciitis 12/09/2016  . Primary osteoarthritis of both feet 12/09/2016  . Primary osteoarthritis of both hands 12/09/2016  . DDD (degenerative disc disease), lumbar 12/09/2016  . Lateral epicondylitis, right elbow 12/09/2016  . Lumbar radiculopathy 02/25/2016  . DDD (degenerative disc disease), cervical 10/19/2012  . PVCs (premature ventricular contractions) 03/12/2011  . Vitamin D deficiency 01/01/2008  . DJD (degenerative joint disease), multiple sites 01/01/2008    Current Outpatient Medications on File Prior to Visit  Medication Sig Dispense Refill  . acetaminophen (TYLENOL) 500 MG tablet Take by mouth as needed.     . Cholecalciferol (VITAMIN D3) 2000 UNITS TABS Take 5,000 Units by mouth daily.    . Cyanocobalamin (VITAMIN B-12 PO) Take by mouth daily.    . diclofenac sodium (VOLTAREN) 1 % GEL Apply 4 g topically 4 (four) times daily. (Patient taking differently: Apply 4 g topically as needed.) 100 g 3   . estradiol (VIVELLE-DOT) 0.075 MG/24HR Place 1 patch onto the skin 2 (two) times a week. (Patient taking differently: Place 1 patch onto the skin 2 (two) times a week. Patient is using .05 instead) 8 patch 0  . fexofenadine (ALLEGRA) 180 MG tablet Take 180 mg by mouth as needed.    . meloxicam (MOBIC) 15 MG tablet Take 15 mg by mouth daily.    . Misc Natural Products (TART CHERRY ADVANCED PO) Take by mouth.    . progesterone (PROMETRIUM) 200 MG capsule Take one capsule (200 mg) by mouth at bedtime. (Patient taking differently: Take one capsule 100 mg by mouth at bedtime.) 30 capsule 0  . TURMERIC PO Take by mouth daily.    . Zinc 25 MG TABS Take 1 tablet by mouth daily. 20MG      No current facility-administered medications on file prior to visit.    Past Medical History:  Diagnosis Date  . Allergy   . Arthritis   . Cancer ([place]) 2019   basal cell on chest and back  . Cyst    hip; probable epidemoid inclusion cyst; resolution with antibiotics  . Right knee meniscal tear  07/2011   Dr Hal Morales, Ortho; S/P cortisone injection  . Spinal stenosis at L4-L5 level 2017   bilateral  . Tear of left acetabular labrum 2006 ?   Dr Sallyanne Havers  . Thyroid nodule    dx by Dr. Maxie Better  . Vitamin D deficiency     Past Surgical History:  Procedure Laterality Date  . [place]  Post fracture   . DILATION AND CURETTAGE OF UTERUS  1988  . FOOT SURGERY     left- bone spurs, Dr Mayer Camel  . HAND SURGERY     Thumb surgery x 2  . KNEE ARTHROPLASTY    . KNEE ARTHROSCOPY Right 2013  . LAPAROTOMY  1988   For Dysmenorrhea  . REPLACEMENT TOTAL KNEE Right 11/2018    Social History   Socioeconomic History  . Marital status: Single    Spouse name: Not on file  . Number of children: 0  . Years of education: Not on file  . Highest education level: Not on file  Occupational History  . Not on file  Tobacco Use  . Smoking status: Never Smoker  . Smokeless tobacco: Never Used  Vaping Use  .  Vaping Use: Never used  Substance and Sexual Activity  . Alcohol use: Yes    Alcohol/week: 5.0 standard drinks    Types: 5 Glasses of wine per week    Comment: social  . Drug use: No  . Sexual activity: Not Currently    Partners: Male  Other Topics Concern  . 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  Walks dog, gardening, PT for back   Social Determinants of Health   Financial Resource Strain: Not on file  Food Insecurity: Not on file  Transportation Needs: Not on file  Physical Activity: Not on file  Stress: Not on file  Social Connections: Not on file    Family History  Problem Relation Age of Onset  . Parkinsonism Father   . Colon polyps Father        No colon cancer  . Transient ischemic attack Father   . Breast cancer Mother 74  . Coronary artery disease Mother        ? radiation related  . Arthritis Mother        DJD  . Kidney disease Mother   . Arthritis Maternal Grandmother        DJD  . Diabetes Maternal Grandfather   . Heart attack Neg Hx     Review of Systems  Constitutional: Negative for chills and fever.  HENT: Positive for trouble swallowing (choking in throat).   Eyes: Negative for visual disturbance.  Respiratory: Negative for cough, shortness of breath and wheezing.   Cardiovascular: Positive for leg swelling (trace). Negative for chest pain and palpitations.  Gastrointestinal: Negative for abdominal pain, blood in stool, constipation, diarrhea and nausea.       No gerd  Endocrine: Positive for cold intolerance.  Genitourinary: Negative for dysuria and hematuria.  Musculoskeletal: Positive for arthralgias, back pain and neck pain.  Skin: Negative for rash.  Neurological: Negative for light-headedness and headaches.  Psychiatric/Behavioral: Negative for dysphoric mood. The patient is not nervous/anxious.        Objective:   Vitals:   05/16/20 0942  BP: 128/64  Pulse: 63  Temp: 98 F (36.7 C)  SpO2: 99%    Filed Weights   05/16/20 0942  Weight: 159 lb (72.1 kg)   Body mass index is 24.9 kg/m.  BP Readings from Last 3 Encounters:  05/16/20 128/64  05/08/20 124/70  11/08/19 125/72    Wt Readings from Last 3 Encounters:  05/16/20 159 lb (72.1 kg)  05/08/20 160 lb 6.4 oz (72.8 kg)  11/08/19 160 lb 6.4 oz (72.8 kg)    Depression screen [facility] 2/9 05/17/2020 10/05/2018 07/20/2017 10/19/2012  Decreased Interest 0 0  0 0  Down, Depressed, Hopeless 0 0 0 0  PHQ - 2 Score 0 0 0 0  Altered sleeping 1 - - -  Tired, decreased energy 1 - - -  Change in appetite 0 - - -  Feeling bad or failure about yourself  0 - - -  Trouble concentrating 0 - - -  Moving slowly or fidgety/restless 0 - - -  Suicidal thoughts 0 - - -  PHQ-9 Score 2 - - -  Difficult doing work/chores Not difficult at all - - -      Physical Exam Constitutional: She appears well-developed and well-nourished. No distress.  HENT:  Head: Normocephalic and atraumatic.  Right Ear: External ear normal. Normal ear canal and TM Left Ear: External ear normal.  Normal ear canal and TM Mouth/Throat: Oropharynx is clear and moist.  Eyes: Conjunctivae and EOM are normal.  Neck: Neck supple. No tracheal deviation present. No thyromegaly present.  No carotid bruit  Cardiovascular: Normal rate, regular rhythm and normal heart sounds.   No murmur heard.  No edema. Pulmonary/Chest: Effort normal and breath sounds normal. No respiratory distress. She has no wheezes. She has no rales.  Breast: deferred   Abdominal: Soft. She exhibits no distension. There is no tenderness.  Lymphadenopathy: She has no cervical adenopathy.  Skin: Skin is warm and dry. She is not diaphoretic.  Psychiatric: She has a normal mood and affect. Her behavior is normal.        Assessment & Plan:   Physical exam: Screening blood work    ordered Immunizations  Up to date  Colonoscopy  Due this month Mammogram  Up to date  Gyn  Up to date  -scheduled Eye exams   Up to date  Exercise  Not regular Weight  normal Substance abuse  none   Screened for depression using the PHQ 9 scale.  No evidence of depression.    See Problem List for Assessment and Plan of chronic medical problems.

## 2020-05-16 ENCOUNTER — Ambulatory Visit (INDEPENDENT_AMBULATORY_CARE_PROVIDER_SITE_OTHER): Payer: BC Managed Care – PPO | Admitting: Internal Medicine

## 2020-05-16 ENCOUNTER — Encounter: Payer: Self-pay | Admitting: Rheumatology

## 2020-05-16 ENCOUNTER — Encounter: Payer: Self-pay | Admitting: Internal Medicine

## 2020-05-16 ENCOUNTER — Other Ambulatory Visit: Payer: Self-pay

## 2020-05-16 VITALS — BP 128/64 | HR 63 | Temp 98.0°F | Ht 67.0 in | Wt 159.0 lb

## 2020-05-16 DIAGNOSIS — Z0000 Encounter for general adult medical examination without abnormal findings: Secondary | ICD-10-CM

## 2020-05-16 DIAGNOSIS — Z1331 Encounter for screening for depression: Secondary | ICD-10-CM

## 2020-05-16 DIAGNOSIS — E559 Vitamin D deficiency, unspecified: Secondary | ICD-10-CM | POA: Diagnosis not present

## 2020-05-16 DIAGNOSIS — E041 Nontoxic single thyroid nodule: Secondary | ICD-10-CM | POA: Diagnosis not present

## 2020-05-16 DIAGNOSIS — Z85828 Personal history of other malignant neoplasm of skin: Secondary | ICD-10-CM | POA: Insufficient documentation

## 2020-05-16 NOTE — Telephone Encounter (Signed)
Reviewed the MRI results.  Which showed degenerative disc disease and spinal stenosis.  I would defer treatment plan to the orthopedic surgeon or neurosurgeon.

## 2020-05-16 NOTE — Patient Instructions (Addendum)
Blood work was ordered.     Medications changes include :   none   A referral was ordered for Endocrine.       Someone from their office will call you to schedule an appointment.    Please followup in 1 year   Health Maintenance, Female Adopting a healthy lifestyle and getting preventive care are important in promoting health and wellness. Ask your health care provider about:  The right schedule for you to have regular tests and exams.  Things you can do on your own to prevent diseases and keep yourself healthy. What should I know about diet, weight, and exercise? Eat a healthy diet  Eat a diet that includes plenty of vegetables, fruits, low-fat dairy products, and lean protein.  Do not eat a lot of foods that are high in solid fats, added sugars, or sodium.   Maintain a healthy weight Body mass index (BMI) is used to identify weight problems. It estimates body fat based on height and weight. Your health care provider can help determine your BMI and help you achieve or maintain a healthy weight. Get regular exercise Get regular exercise. This is one of the most important things you can do for your health. Most adults should:  Exercise for at least 150 minutes each week. The exercise should increase your heart rate and make you sweat (moderate-intensity exercise).  Do strengthening exercises at least twice a week. This is in addition to the moderate-intensity exercise.  Spend less time sitting. Even light physical activity can be beneficial. Watch cholesterol and blood lipids Have your blood tested for lipids and cholesterol at 59 years of age, then have this test every 5 years. Have your cholesterol levels checked more often if:  Your lipid or cholesterol levels are high.  You are older than 59 years of age.  You are at high risk for heart disease. What should I know about cancer screening? Depending on your health history and family history, you may need to have cancer  screening at various ages. This may include screening for:  Breast cancer.  Cervical cancer.  Colorectal cancer.  Skin cancer.  Lung cancer. What should I know about heart disease, diabetes, and high blood pressure? Blood pressure and heart disease  High blood pressure causes heart disease and increases the risk of stroke. This is more likely to develop in people who have high blood pressure readings, are of African descent, or are overweight.  Have your blood pressure checked: ? Every 3-5 years if you are 19-27 years of age. ? Every year if you are 19 years old or older. Diabetes Have regular diabetes screenings. This checks your fasting blood sugar level. Have the screening done:  Once every three years after age 72 if you are at a normal weight and have a low risk for diabetes.  More often and at a younger age if you are overweight or have a high risk for diabetes. What should I know about preventing infection? Hepatitis B If you have a higher risk for hepatitis B, you should be screened for this virus. Talk with your health care provider to find out if you are at risk for hepatitis B infection. Hepatitis C Testing is recommended for:  Everyone born from [birth year] through 1964.  Anyone with known risk factors for hepatitis C. Sexually transmitted infections (STIs)  Get screened for STIs, including gonorrhea and chlamydia, if: ? You are sexually active and are younger than 59 years of age. ? You  are older than 59 years of age and your health care provider tells you that you are at risk for this type of infection. ? Your sexual activity has changed since you were last screened, and you are at increased risk for chlamydia or gonorrhea. Ask your health care provider if you are at risk.  Ask your health care provider about whether you are at high risk for HIV. Your health care provider may recommend a prescription medicine to help prevent HIV infection. If you choose to take  medicine to prevent HIV, you should first get tested for HIV. You should then be tested every 3 months for as long as you are taking the medicine. Pregnancy  If you are about to stop having your period (premenopausal) and you may become pregnant, seek counseling before you get pregnant.  Take 400 to 800 micrograms (mcg) of folic acid every day if you become pregnant.  Ask for birth control (contraception) if you want to prevent pregnancy. Osteoporosis and menopause Osteoporosis is a disease in which the bones lose minerals and strength with aging. This can result in bone fractures. If you are 8 years old or older, or if you are at risk for osteoporosis and fractures, ask your health care provider if you should:  Be screened for bone loss.  Take a calcium or vitamin D supplement to lower your risk of fractures.  Be given hormone replacement therapy (HRT) to treat symptoms of menopause. Follow these instructions at home: Lifestyle  Do not use any products that contain nicotine or tobacco, such as cigarettes, e-cigarettes, and chewing tobacco. If you need help quitting, ask your health care provider.  Do not use street drugs.  Do not share needles.  Ask your health care provider for help if you need support or information about quitting drugs. Alcohol use  Do not drink alcohol if: ? Your health care provider tells you not to drink. ? You are pregnant, may be pregnant, or are planning to become pregnant.  If you drink alcohol: ? Limit how much you use to 0-1 drink a day. ? Limit intake if you are breastfeeding.  Be aware of how much alcohol is in your drink. In the [place] one drink equals one 12 oz bottle of beer (355 mL), one 5 oz glass of wine (148 mL), or one 1 oz glass of hard liquor (44 mL). General instructions  Schedule regular health, dental, and eye exams.  Stay current with your vaccines.  Tell your health care provider if: ? You often feel depressed. ? You have  ever been abused or do not feel safe at home. Summary  Adopting a healthy lifestyle and getting preventive care are important in promoting health and wellness.  Follow your health care provider's instructions about healthy diet, exercising, and getting tested or screened for diseases.  Follow your health care provider's instructions on monitoring your cholesterol and blood pressure. This information is not intended to replace advice given to you by your health care provider. Make sure you discuss any questions you have with your health care provider. Document Revised: 02/22/2018 Document Reviewed: 02/22/2018 Elsevier Patient Education  2021 [employer].

## 2020-05-17 ENCOUNTER — Encounter: Payer: Self-pay | Admitting: Internal Medicine

## 2020-05-17 NOTE — Assessment & Plan Note (Addendum)
Chronic Always cold, occ diff swallowing which may be related to cervical spine dz Would like further eval Will refer to endocrine Tsh, ft3, ft4

## 2020-05-17 NOTE — Assessment & Plan Note (Signed)
Chronic Taking vitamin D daily Check vitamin D level  

## 2020-05-23 ENCOUNTER — Other Ambulatory Visit (INDEPENDENT_AMBULATORY_CARE_PROVIDER_SITE_OTHER): Payer: BC Managed Care – PPO

## 2020-05-23 DIAGNOSIS — E041 Nontoxic single thyroid nodule: Secondary | ICD-10-CM

## 2020-05-23 DIAGNOSIS — Z0000 Encounter for general adult medical examination without abnormal findings: Secondary | ICD-10-CM

## 2020-05-23 DIAGNOSIS — E559 Vitamin D deficiency, unspecified: Secondary | ICD-10-CM

## 2020-05-23 LAB — LIPID PANEL
Cholesterol: 177 mg/dL (ref 0–200)
HDL: 74.3 mg/dL (ref >39.00)
LDL Cholesterol: 91 mg/dL (ref 0–99)
NonHDL: 102.92
Total CHOL/HDL Ratio: 2
Triglycerides: 59 mg/dL (ref 0.0–149.0)
VLDL: 11.8 mg/dL (ref 0.0–40.0)

## 2020-05-23 LAB — CBC WITH DIFFERENTIAL/PLATELET
Basophils Absolute: 0.1 10*3/uL (ref 0.0–0.1)
Basophils Relative: 2.5 % (ref 0.0–3.0)
Eosinophils Absolute: 0.1 10*3/uL (ref 0.0–0.7)
Eosinophils Relative: 1.8 % (ref 0.0–5.0)
HCT: 37.3 % (ref 36.0–46.0)
Hemoglobin: 12.5 g/dL (ref 12.0–15.0)
Lymphocytes Relative: 27.3 % (ref 12.0–46.0)
Lymphs Abs: 1.3 10*3/uL (ref 0.7–4.0)
MCHC: 33.4 g/dL (ref 30.0–36.0)
MCV: 93.4 fl (ref 78.0–100.0)
Monocytes Absolute: 0.4 10*3/uL (ref 0.1–1.0)
Monocytes Relative: 9.2 % (ref 3.0–12.0)
Neutro Abs: 2.8 10*3/uL (ref 1.4–7.7)
Neutrophils Relative %: 59.2 % (ref 43.0–77.0)
Platelets: 259 10*3/uL (ref 150.0–400.0)
RBC: 4 Mil/uL (ref 3.87–5.11)
RDW: 13.6 % (ref 11.5–15.5)
WBC: 4.7 10*3/uL (ref 4.0–10.5)

## 2020-05-23 LAB — COMPREHENSIVE METABOLIC PANEL
ALT: 14 U/L (ref 0–35)
AST: 14 U/L (ref 0–37)
Albumin: 4 g/dL (ref 3.5–5.2)
Alkaline Phosphatase: 69 U/L (ref 39–117)
BUN: 14 mg/dL (ref 6–23)
CO2: 25 mEq/L (ref 19–32)
Calcium: 9.3 mg/dL (ref 8.4–10.5)
Chloride: 107 mEq/L (ref 96–112)
Creatinine, Ser: 0.94 mg/dL (ref 0.40–1.20)
GFR: 66.9 mL/min (ref >60.00)
Glucose, Bld: 88 mg/dL (ref 70–99)
Potassium: 4 mEq/L (ref 3.5–5.1)
Sodium: 140 mEq/L (ref 135–145)
Total Bilirubin: 0.7 mg/dL (ref 0.2–1.2)
Total Protein: 6.6 g/dL (ref 6.0–8.3)

## 2020-05-23 LAB — VITAMIN D 25 HYDROXY (VIT D DEFICIENCY, FRACTURES): VITD: 51.48 ng/mL (ref 30.00–100.00)

## 2020-05-23 LAB — T4, FREE: Free T4: 0.91 ng/dL (ref 0.60–1.60)

## 2020-05-23 LAB — T3, FREE: T3, Free: 3.3 pg/mL (ref 2.3–4.2)

## 2020-05-23 LAB — TSH: TSH: 0.79 u[IU]/mL (ref 0.35–4.50)

## 2020-05-29 ENCOUNTER — Encounter: Payer: Self-pay | Admitting: Internal Medicine

## 2020-05-29 DIAGNOSIS — E041 Nontoxic single thyroid nodule: Secondary | ICD-10-CM

## 2020-06-19 ENCOUNTER — Telehealth: Payer: Self-pay | Admitting: *Deleted

## 2020-06-19 ENCOUNTER — Ambulatory Visit (INDEPENDENT_AMBULATORY_CARE_PROVIDER_SITE_OTHER): Payer: BC Managed Care – PPO | Admitting: Obstetrics and Gynecology

## 2020-06-19 ENCOUNTER — Other Ambulatory Visit: Payer: Self-pay

## 2020-06-19 ENCOUNTER — Encounter: Payer: Self-pay | Admitting: Obstetrics and Gynecology

## 2020-06-19 VITALS — BP 122/60 | HR 104 | Ht 67.0 in | Wt 157.0 lb

## 2020-06-19 DIAGNOSIS — Z01419 Encounter for gynecological examination (general) (routine) without abnormal findings: Secondary | ICD-10-CM

## 2020-06-19 MED ORDER — PROGESTERONE MICRONIZED 100 MG PO CAPS
ORAL_CAPSULE | ORAL | 3 refills | Status: DC
Start: 2020-06-19 — End: 2020-08-19

## 2020-06-19 MED ORDER — ESTRADIOL 0.05 MG/24HR TD PTTW: 1.0000 | MEDICATED_PATCH | TRANSDERMAL | 3 refills | Status: DC

## 2020-06-19 NOTE — Patient Instructions (Signed)

## 2020-06-19 NOTE — Progress Notes (Signed)
60 y.o. G0P0000 Single Caucasian female here for annual exam.   Stopped HRT 2 weeks ago due to running out of prescription.  She was using her patch for 5 days at a time. She did take the Prometrium without taking the estrogen.  Waking up at night, twice a night with hot flashes.  Doing well during the day.  Had a lot of breast tenderness with Prometrium 200 mg, so she switched back to 100 mg.   No bleeding or spotting.   Used gabapentin in past when she had a knee replacement.  Declines this for the future due to tapering issues.   Will be seeing Dr.Cristina Gherghe in a few weeks for thyroid nodules.  Received her Covid vaccine and not booster.   PCP: Billey Gosling, MD    Patient's last menstrual period was 02/23/2018 (approximate).           Sexually active: No.  The current method of family planning is  Abstinence/PMP.    Exercising: No.  The patient does not participate in regular exercise at present. Smoker:  no  Health Maintenance: Pap: 04-22-17 neg:Neg HR HPV, 03-18-14 Neg:Neg HR HPV History of abnormal Pap:  no MMG:  08-31-19 Neg/Birads1 Colonoscopy: 05-25-13, negative, repeat in 7 years  BMD: 12-12-19  Result :Normal TDaP: 12-16-15 Gardasil:   no HIV:05-31-12 Neg Hep C:05-31-12 Neg Screening Labs:  PCP.    reports that she has never smoked. She has never used smokeless tobacco. She reports current alcohol use of about 2.0 standard drinks of alcohol per week. She reports that she does not use drugs.  Past Medical History:  Diagnosis Date  . Allergy   . Arthritis   . Cancer ([place]) 2019   basal cell on chest and back  . Cyst    hip; probable epidemoid inclusion cyst; resolution with antibiotics  . Right knee meniscal tear  07/2011   Dr Hal Morales, Ortho; S/P cortisone injection  . Spinal stenosis at L4-L5 level 2017   bilateral  . Squamous cell carcinoma of leg, left 2022  . Tear of left acetabular labrum 2006 ?   Dr Sallyanne Havers  . Thyroid nodule    dx by Dr. Maxie Better  .  Vitamin D deficiency     Past Surgical History:  Procedure Laterality Date  . ANKLE SURGERY  1989    Post fracture   . DILATION AND CURETTAGE OF UTERUS  1988  . FOOT SURGERY     left- bone spurs, Dr Mayer Camel  . HAND SURGERY     Thumb surgery x 2  . KNEE ARTHROPLASTY    . KNEE ARTHROSCOPY Right 2013  . LAPAROTOMY  1988   For Dysmenorrhea  . REPLACEMENT TOTAL KNEE Right 11/2018    Current Outpatient Medications  Medication Sig Dispense Refill  . acetaminophen (TYLENOL) 500 MG tablet Take by mouth as needed.     . Cholecalciferol (VITAMIN D3) 2000 UNITS TABS Take 5,000 Units by mouth daily.    . diclofenac sodium (VOLTAREN) 1 % GEL Apply 4 g topically 4 (four) times daily. (Patient taking differently: Apply 4 g topically as needed.) 100 g 3  . fexofenadine (ALLEGRA) 180 MG tablet Take 180 mg by mouth as needed.    . meloxicam (MOBIC) 15 MG tablet Take 15 mg by mouth daily.    . Misc Natural Products (TART CHERRY ADVANCED PO) Take by mouth.    . TURMERIC PO Take by mouth daily.    . Zinc 25 MG TABS Take 1  tablet by mouth daily. 20MG     . estradiol (VIVELLE-DOT) 0.075 MG/24HR Place 1 patch onto the skin 2 (two) times a week. (Patient not taking: Reported on 06/19/2020) 8 patch 0  . progesterone (PROMETRIUM) 200 MG capsule Take one capsule (200 mg) by mouth at bedtime. (Patient not taking: Reported on 06/19/2020) 30 capsule 0   No current facility-administered medications for this visit.    Family History  Problem Relation Age of Onset  . Parkinsonism Father   . Colon polyps Father        No colon cancer  . Transient ischemic attack Father   . Breast cancer Mother 62  . Coronary artery disease Mother        ? radiation related  . Arthritis Mother        DJD  . Kidney disease Mother   . Arthritis Maternal Grandmother        DJD  . Diabetes Maternal Grandfather   . Heart attack Neg Hx     Review of Systems  All other systems reviewed and are negative.   Exam:   BP 122/60    Pulse (!) 104   Ht 5\' 7"  (1.702 m)   Wt 157 lb (71.2 kg)   LMP 02/23/2018 (Approximate)   SpO2 99%   BMI 24.59 kg/m     General appearance: alert, cooperative and appears stated age Head: normocephalic, without obvious abnormality, atraumatic Neck: no adenopathy, supple, symmetrical, trachea midline and thyroid normal to inspection and palpation Lungs: clear to auscultation bilaterally Breasts: normal appearance, no masses or tenderness, No nipple retraction or dimpling, No nipple discharge or bleeding, No axillary adenopathy Heart: regular rate and rhythm Abdomen: soft, non-tender; no masses, no organomegaly Extremities: extremities normal, atraumatic, no cyanosis or edema Skin: skin color, texture, turgor normal. No rashes or lesions Lymph nodes: cervical, supraclavicular, and axillary nodes normal. Neurologic: grossly normal  Pelvic: External genitalia:  no lesions              No abnormal inguinal nodes palpated.              Urethra:  normal appearing urethra with no masses, tenderness or lesions              Bartholins and Skenes: normal                 Vagina: normal appearing vagina with normal color and discharge, no lesions              Cervix: no lesions.  2 mm clot at cervical os.              Pap taken: No. Bimanual Exam:  Uterus:  normal size, contour, position, consistency, mobility, non-tender              Adnexa: no mass, fullness, tenderness              Rectal exam: Yes.  .  Confirms.              Anus:  normal sphincter tone, no lesions  Chaperone was present for exam.  Assessment:   Well woman visit with normal exam. Menopausal symptoms. Recently stopped HRT.  Spotting likely due to her stopping HRT. Fibroids.  Cervical stenosis. FH post menopausal breast cancer. Hx HSV 2.   Plan: Mammogram screening discussed. Self breast awareness reviewed. Pap and HR HPV as above. Guidelines for Calcium, Vitamin D, regular exercise program including  cardiovascular and weight bearing exercise. Vivelle Dot  0.05 mg twice weekly and Prometrium 100 mg nightly. Discused WHI and use of HRT which can increase risk of PE, DVT, MI, stroke and breast cancer.  No pelvic US at this time.  If has any bleeding or spotting in future, return for evaluation.   Follow up annually and prn.

## 2020-06-19 NOTE — Telephone Encounter (Signed)
Rx confirmation request received from [facility] regarding Prometrium Rx.   Rx received today, requesting Rx clarification.  Prometrium 100 mg capsule.  Take one capsule (200mg ) PO by mouth at bedtime.    Reviewed OV notes dated 06/19/20 per Dr. Quincy Simmonds. "Prometrium 100 mg nightly"  Spoke with Cecille Rubin at [facility].  Clarified RX Prometrium 100 mg capsule.  Take one capsule (100mg ) PO by mouth at bedtime.  #90/3RF Read back and confirmed.   Routing to provider for final review. Patient is agreeable to disposition. Will close encounter.

## 2020-07-10 ENCOUNTER — Ambulatory Visit (INDEPENDENT_AMBULATORY_CARE_PROVIDER_SITE_OTHER): Payer: BC Managed Care – PPO | Admitting: Internal Medicine

## 2020-07-10 ENCOUNTER — Other Ambulatory Visit: Payer: Self-pay

## 2020-07-10 ENCOUNTER — Encounter: Payer: Self-pay | Admitting: Internal Medicine

## 2020-07-10 VITALS — BP 140/92 | HR 61 | Ht 67.0 in | Wt 159.8 lb

## 2020-07-10 DIAGNOSIS — E042 Nontoxic multinodular goiter: Secondary | ICD-10-CM | POA: Diagnosis not present

## 2020-07-10 NOTE — Patient Instructions (Signed)
Please send me a message in 1.5 years to schedule a new Thyroid U/S.  Let's see you back in 1.5 years, ~2 weeks after the thyroid U/S.

## 2020-07-10 NOTE — Progress Notes (Signed)
Patient ID: Michele Bennett, female   DOB: 17-Jun-1960, 60 y.o.   MRN: [id number]   This visit occurred during the SARS-CoV-2 public health emergency.  Safety protocols were in place, including screening questions prior to the visit, additional usage of staff PPE, and extensive cleaning of exam room while observing appropriate contact time as indicated for disinfecting solutions.   HPI  Michele Bennett is a 60 y.o.-year-old female, referred by her PCP, Dr. Quay Burow, for evaluation for thyroid nodules.  She was found to have thyroid nodules incidentally on an MRI of the neck.  She then had a thyroid ultrasound that showed 2 supra centimeters nodules.  She was referred to endocrinology.  Thyroid U/S (12/20/2019): Parenchymal Echotexture: Mildly heterogenous Isthmus: 0.3 cm Right lobe: 5.7 x 1.5 x 2 cm Left lobe: 5.9 x 1.3 x 2 cm _________________________________________________________  Estimated total number of nodules >/= 1 cm: 2 _________________________________________________________  There are multiple right-sided thyroid nodules that are cystic in composition measure up to approximately 9 mm. These thyroid nodules do not require further follow-up.  In the left thyroid gland, there is a spongiform appearing 1.3 cm thyroid nodule in the left mid thyroid gland for which no further follow-up is recommended.   In the left inferior thyroid gland there is a 1.2 cm ill-defined isoechoic thyroid nodule. This is consistent with a TR 3 thyroid nodule requiring no further follow-up.   IMPRESSION: Multiple small bilateral thyroid nodules, none of which meet criteria for follow-up or fine-needle aspiration.  Pt denies: - feeling nodules in neck - hoarseness - SOB with lying down She describes occasional, occasional dysphagia especially lying down and also with dry foods (bread, chicken).  Her thyroid tests have been normal: Lab Results  Component Value Date   TSH  0.79 05/23/2020   TSH 0.51 12/21/2019   TSH 1.11 10/06/2018   TSH 0.820 04/22/2017   TSH 1.34 03/01/2016   TSH 1.31 01/03/2015   TSH 0.67 01/23/2014   TSH 1.07 10/20/2012   TSH 0.82 10/25/2011   TSH 0.74 03/12/2011   FREET4 0.91 05/23/2020   FREET4 0.91 12/21/2019   FREET4 0.86 10/25/2011    Pt mentions: - fatigue - cold intolerance - wt gain  But denies: - tremors - anxiety/depression - hyperdefecation/constipation - weight loss - dry skin - hair loss  + FH of thyroid ds in mother (nodules). No FH of thyroid cancer. No h/o radiation tx to head or neck.  No seaweed or kelp. No recent contrast studies. No steroid use recently. No herbal supplements. No Biotin supplements or Hair, Skin and Nails vitamins. + Tart cherry, turmeric, Zn, vit D.  Pt also has a history of OA - had TKR and right knee and will use for left knee, also, back pain.  ROS: Constitutional: + See HPI, + nocturia, + poor sleep Eyes: + Blurry vision, no xerophthalmia ENT: no sore throat,  + see HPI Cardiovascular: no CP/SOB/+ palpitations/no leg swelling Respiratory: no cough/SOB Gastrointestinal: no N/V/D/C Musculoskeletal: + Both: Muscle/joint aches Skin: no rashes, + easy bruising Neurological: no tremors/numbness/tingling/dizziness Psychiatric: no depression/anxiety  Past Medical History:  Diagnosis Date  . Allergy   . Arthritis   . Cancer ([place]) 2019   basal cell on chest and back  . Cyst    hip; probable epidemoid inclusion cyst; resolution with antibiotics  . Right knee meniscal tear  07/2011   Dr Hal Morales, Ortho; S/P cortisone injection  . Spinal stenosis at L4-L5 level 2017   bilateral  .  Squamous cell carcinoma of leg, left 2022  . Tear of left acetabular labrum 2006 ?   Dr Sallyanne Havers  . Thyroid nodule    dx by Dr. Maxie Better  . Vitamin D deficiency    Past Surgical History:  Procedure Laterality Date  . ANKLE SURGERY  1989    Post fracture   . DILATION AND CURETTAGE OF UTERUS  1988  .  FOOT SURGERY     left- bone spurs, Dr Mayer Camel  . HAND SURGERY     Thumb surgery x 2  . KNEE ARTHROPLASTY    . KNEE ARTHROSCOPY Right 2013  . LAPAROTOMY  1988   For Dysmenorrhea  . REPLACEMENT TOTAL KNEE Right 11/2018   Social History   Socioeconomic History  . Marital status: Single    Spouse name: Not on file  . Number of children: 0  . Years of education: Not on file  . Highest education level: Not on file  Occupational History  . Not on file  Tobacco Use  . Smoking status: Never Smoker  . Smokeless tobacco: Never Used  Vaping Use  . Vaping Use: Never used  Substance and Sexual Activity  . Alcohol use: Yes    Alcohol/week: 2.0 standard drinks    Types: 2 Glasses of wine per week    Comment: social  . Drug use: No  . Sexual activity: Not Currently    Partners: Male  Other Topics Concern  . 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  Walks dog, gardening, PT for back   Social Determinants of Health   Financial Resource Strain: Not on file  Food Insecurity: Not on file  Transportation Needs: Not on file  Physical Activity: Not on file  Stress: Not on file  Social Connections: Not on file  Intimate Partner Violence: Not on file   Current Outpatient Medications on File Prior to Visit  Medication Sig Dispense Refill  . acetaminophen (TYLENOL) 500 MG tablet Take by mouth as needed.     . Cholecalciferol (VITAMIN D3) 2000 UNITS TABS Take 5,000 Units by mouth daily.    . diclofenac sodium (VOLTAREN) 1 % GEL Apply 4 g topically 4 (four) times daily. (Patient taking differently: Apply 4 g topically as needed.) 100 g 3  . estradiol (VIVELLE-DOT) 0.05 MG/24HR patch Place 1 patch (0.05 mg total) onto the skin 2 (two) times a week. 24 patch 3  . fexofenadine (ALLEGRA) 180 MG tablet Take 180 mg by mouth as needed.    . meloxicam (MOBIC) 15 MG tablet Take 15 mg by mouth daily.    . Misc Natural Products (TART CHERRY ADVANCED PO) Take by mouth.    .  progesterone (PROMETRIUM) 100 MG capsule Take one capsule (200 mg) by mouth at bedtime. 90 capsule 3  . TURMERIC PO Take by mouth daily.    . Zinc 25 MG TABS Take 1 tablet by mouth daily. 20MG      No current facility-administered medications on file prior to visit.   No Known Allergies Family History  Problem Relation Age of Onset  . Parkinsonism Father   . Colon polyps Father        No colon cancer  . Transient ischemic attack Father   . Breast cancer Mother 22  . Coronary artery disease Mother        ? radiation related  . Arthritis Mother        DJD  . Kidney disease Mother   .  Arthritis Maternal Grandmother        DJD  . Diabetes Maternal Grandfather   . Heart attack Neg Hx     PE: BP (!) 140/92 (BP Location: Right Arm, Patient Position: Sitting, Cuff Size: Normal)   Pulse 61   Ht 5\' 7"  (1.702 m)   Wt 159 lb 12.8 oz (72.5 kg)   LMP 02/23/2018 (Approximate)   SpO2 99%   BMI 25.03 kg/m  Wt Readings from Last 3 Encounters:  07/10/20 159 lb 12.8 oz (72.5 kg)  06/19/20 157 lb (71.2 kg)  05/16/20 159 lb (72.1 kg)   Constitutional: normal weight, in NAD Eyes: PERRLA, EOMI, no exophthalmos ENT: moist mucous membranes, no thyromegaly, no neck masses palpated, no cervical lymphadenopathy Cardiovascular: RRR, No MRG Respiratory: CTA B Gastrointestinal: abdomen soft, NT, ND, BS+ Musculoskeletal: no deformities, strength intact in all 4;  Skin: moist, warm, no rashes Neurological: no tremor with outstretched hands, DTR normal in left knee, but not elicited in the right knee due to previous TKR  ASSESSMENT: 1. Thyroid nodules  PLAN: 1. Thyroid nodules - I reviewed the images of her thyroid ultrasound along with the patient. I pointed out that the dominant nodules are small, barely above 1 cm in the largest dimension and they also do not have worrisome features: - not hypoechoic except one of the nodules in the left lobe, however, this nodule appears spongiform, which is a  benign feature - without microcalcifications - without internal blood flow - more wide than tall - well delimited from surrounding tissue - Pt does not have a thyroid cancer family history or a personal history of RxTx to head/neck. All these would favor benignity.  - we discussed that her occasional choking or dysphagia with lying down is unlikely to be related to these nodules, since they are so small. - for now, we discussed about just following the nodules by repeating the thyroid ultrasound in 1 to 2 years. - at that time, if nodules have grown, and they meet the criteria for biopsy, we can proceed with this, but otherwise, if they did not grow, probably no follow-up will be needed - we discussed that if she were to develop more significant neck compression symptoms,we might need to do either lobectomy or thyroidectomy, however, thyroid surgery is not complicated, but it can have side effects and also she might have a risk of ~25% of becoming hypothyroid after hemithyroidectomy.  - pt agrees with expectant management for now - We also reviewed her TFTs from 2012 to 05/2020 together and I explained that these are perfectly normal, and unlikely to be related to her fatigue and cold intolerance - I'll see her back in 1.5 years but I did advise her to contact me approximately 2 weeks prior to our next appointment so I can order a thyroid ultrasound to review the images with her at the time of the appointment   Philemon Kingdom, MD PhD [facility] Endocrinology

## 2020-07-24 ENCOUNTER — Encounter: Payer: Self-pay | Admitting: Rheumatology

## 2020-07-24 NOTE — Telephone Encounter (Signed)
Ok to reapply for visco gel injections for the left knee.    She had synvisc injections x2 previously.  Would she like to apply for synvisc or hyalgan?

## 2020-07-25 NOTE — Telephone Encounter (Signed)
I submitted request for VOB 07/25/2020.

## 2020-07-30 NOTE — Telephone Encounter (Signed)
I LMOM for patient to call for benefits, and to schedule Visco knee injections.

## 2020-07-30 NOTE — Telephone Encounter (Signed)
Please call patient to schedule Visco knee injections.  Authorized for Synvisc Left Knee. Buy and Bill. Deductible does not apply. PA for Synvisc thru [facility]. Authorization # [id number]   Effective: 07/28/2020- 01/23/2021 Insurance to 100% of allowable cost for administration / product, after a $40.00 copay.

## 2020-08-04 ENCOUNTER — Ambulatory Visit: Payer: BC Managed Care – PPO | Admitting: Rheumatology

## 2020-08-04 ENCOUNTER — Other Ambulatory Visit: Payer: Self-pay

## 2020-08-04 DIAGNOSIS — M1712 Unilateral primary osteoarthritis, left knee: Secondary | ICD-10-CM

## 2020-08-04 MED ORDER — LIDOCAINE HCL 1 % IJ SOLN
1.5000 mL | INTRAMUSCULAR | Status: AC | PRN
  Administered 2020-08-04: 1.5 mL

## 2020-08-04 MED ORDER — HYLAN G-F 20 16 MG/2ML IX SOSY
16.0000 mg | PREFILLED_SYRINGE | INTRA_ARTICULAR | Status: AC | PRN
  Administered 2020-08-04: 16 mg via INTRA_ARTICULAR

## 2020-08-04 NOTE — Progress Notes (Signed)
   Procedure Note  Patient: Cayle Cordoba             Date of Birth: 17-Sep-1960           MRN: [id number]             Visit Date: 08/04/2020  Procedures: Visit Diagnoses:  1. Primary osteoarthritis of left knee     Synvisc #1 left knee, B/B Large Joint Inj: L knee on 08/04/2020 10:44 AM Indications: pain Details: 25 G 1.5 in needle, medial approach  Arthrogram: No  Medications: 1.5 mL lidocaine 1 %; 16 mg Hylan 16 MG/2ML Aspirate: 0 mL Outcome: tolerated well, no immediate complications Procedure, treatment alternatives, risks and benefits explained, specific risks discussed. Consent was given by the patient. Immediately prior to procedure a time out was called to verify the correct patient, procedure, equipment, support staff and site/side marked as required. Patient was prepped and draped in the usual sterile fashion.     Postprocedure instructions were given.  Bo Merino, MD

## 2020-08-12 ENCOUNTER — Ambulatory Visit: Payer: BC Managed Care – PPO | Admitting: Physician Assistant

## 2020-08-12 ENCOUNTER — Other Ambulatory Visit: Payer: Self-pay

## 2020-08-12 DIAGNOSIS — M1712 Unilateral primary osteoarthritis, left knee: Secondary | ICD-10-CM

## 2020-08-12 MED ORDER — HYLAN G-F 20 16 MG/2ML IX SOSY
16.0000 mg | PREFILLED_SYRINGE | INTRA_ARTICULAR | Status: AC | PRN
  Administered 2020-08-12: 16 mg via INTRA_ARTICULAR

## 2020-08-12 MED ORDER — LIDOCAINE HCL 1 % IJ SOLN
1.5000 mL | INTRAMUSCULAR | Status: AC | PRN
  Administered 2020-08-12: 1.5 mL

## 2020-08-12 NOTE — Progress Notes (Signed)
   Procedure Note  Patient: Michele Bennett             Date of Birth: 1960-08-26           MRN: [id number]             Visit Date: 08/12/2020  Procedures: Visit Diagnoses:  1. Primary osteoarthritis of left knee    Synvisc #2 Left knee joint injection  Large Joint Inj: L knee on 08/12/2020 8:36 AM Indications: pain Details: 25 G 1.5 in needle, medial approach  Arthrogram: No  Medications: 1.5 mL lidocaine 1 %; 16 mg Hylan 16 MG/2ML Aspirate: 0 mL Outcome: tolerated well, no immediate complications Procedure, treatment alternatives, risks and benefits explained, specific risks discussed. Consent was given by the patient. Immediately prior to procedure a time out was called to verify the correct patient, procedure, equipment, support staff and site/side marked as required. Patient was prepped and draped in the usual sterile fashion.      Patient tolerated the procedure well.  Aftercare was discussed.  Hazel Sams, PA-C

## 2020-08-18 ENCOUNTER — Other Ambulatory Visit: Payer: Self-pay

## 2020-08-18 ENCOUNTER — Encounter: Payer: Self-pay | Admitting: Obstetrics and Gynecology

## 2020-08-18 ENCOUNTER — Ambulatory Visit: Payer: BC Managed Care – PPO | Admitting: Physician Assistant

## 2020-08-18 DIAGNOSIS — M1712 Unilateral primary osteoarthritis, left knee: Secondary | ICD-10-CM | POA: Diagnosis not present

## 2020-08-18 MED ORDER — LIDOCAINE HCL 1 % IJ SOLN
1.5000 mL | INTRAMUSCULAR | Status: AC | PRN
  Administered 2020-08-18: 1.5 mL

## 2020-08-18 MED ORDER — HYLAN G-F 20 16 MG/2ML IX SOSY
16.0000 mg | PREFILLED_SYRINGE | INTRA_ARTICULAR | Status: AC | PRN
  Administered 2020-08-18: 16 mg via INTRA_ARTICULAR

## 2020-08-18 NOTE — Progress Notes (Signed)
   Procedure Note  Patient: Michele Bennett             Date of Birth: Dec 18, 1960           MRN: [id number]             Visit Date: 08/18/2020  Procedures: Visit Diagnoses:  1. Primary osteoarthritis of left knee      synvisc #3 left knee, B/B  Large Joint Inj: L knee on 08/18/2020 9:19 AM Indications: pain Details: 25 G 1.5 in needle, medial approach  Arthrogram: No  Medications: 1.5 mL lidocaine 1 %; 16 mg Hylan 16 MG/2ML Aspirate: 0 mL Outcome: tolerated well, no immediate complications Procedure, treatment alternatives, risks and benefits explained, specific risks discussed. Consent was given by the patient. Immediately prior to procedure a time out was called to verify the correct patient, procedure, equipment, support staff and site/side marked as required. Patient was prepped and draped in the usual sterile fashion.    Patient tolerated the procedure well.  Aftercare was discussed.  Hazel Sams, PA-C

## 2020-08-19 ENCOUNTER — Other Ambulatory Visit: Payer: Self-pay | Admitting: Obstetrics and Gynecology

## 2020-08-19 MED ORDER — ESTRADIOL 0.075 MG/24HR TD PTTW: 1.0000 | MEDICATED_PATCH | TRANSDERMAL | 2 refills | Status: DC

## 2020-08-19 MED ORDER — PROGESTERONE MICRONIZED 100 MG PO CAPS: 100.0000 mg | ORAL_CAPSULE | Freq: Every day | ORAL | 2 refills | Status: DC

## 2020-08-19 NOTE — Telephone Encounter (Signed)
Ok for switch to Vivelle Dot 0.075 mg to skin twice weekly.  Disp:  24, RF:  2 Prometrium 100 mg po q hs.  Disp:  90, RF: 2.

## 2020-09-29 ENCOUNTER — Other Ambulatory Visit: Payer: Self-pay | Admitting: Obstetrics and Gynecology

## 2020-09-29 DIAGNOSIS — Z1231 Encounter for screening mammogram for malignant neoplasm of breast: Secondary | ICD-10-CM

## 2020-10-06 ENCOUNTER — Other Ambulatory Visit: Payer: Self-pay

## 2020-10-06 ENCOUNTER — Ambulatory Visit
Admission: RE | Admit: 2020-10-06 | Discharge: 2020-10-06 | Disposition: A | Discharge status: Home / Self Care | Payer: BC Managed Care – PPO | Source: Ambulatory Visit | Attending: Obstetrics and Gynecology | Admitting: Obstetrics and Gynecology

## 2020-10-06 DIAGNOSIS — Z1231 Encounter for screening mammogram for malignant neoplasm of breast: Secondary | ICD-10-CM

## 2020-12-24 ENCOUNTER — Ambulatory Visit: Payer: BC Managed Care – PPO

## 2020-12-31 ENCOUNTER — Ambulatory Visit (INDEPENDENT_AMBULATORY_CARE_PROVIDER_SITE_OTHER): Payer: BC Managed Care – PPO

## 2020-12-31 ENCOUNTER — Other Ambulatory Visit: Payer: Self-pay

## 2020-12-31 DIAGNOSIS — Z23 Encounter for immunization: Secondary | ICD-10-CM | POA: Diagnosis not present

## 2021-01-04 NOTE — Progress Notes (Signed)
Subjective:    Patient ID: Michele Bennett, female    DOB: 06/06/1960, 60 y.o.   MRN: [id number]  This visit occurred during the SARS-CoV-2 public health emergency.  Safety protocols were in place, including screening questions prior to the visit, additional usage of staff PPE, and extensive cleaning of exam room while observing appropriate contact time as indicated for disinfecting solutions.    HPI The patient is here for an acute visit.   Numbness/tingling in feet - more pronounced with cooler weather-she notices it when she first gets into a warm tub her toes burn when they get in the warm water.  Intermittent tingling in toes.  She denies any pain.  She denies any tingling in her fingers.  She does have occasional tingling or discomfort in the right leg, but that is from her lower back.  She denies any changes in the tingling with activity or position.  She thinks she noticed this beginning of the year.    Medications and allergies reviewed with patient and updated if appropriate.  Patient Active Problem List   Diagnosis Date Noted   Multiple thyroid nodules 07/10/2020   History of SCC (squamous cell carcinoma) of skin 05/16/2020   Thyroid nodule-benign-no follow-up needed 12/12/2019   Spondylosis without myelopathy or radiculopathy, lumbar region 03/14/2019   Chest tightness 10/05/2018   Primary osteoarthritis of both knees 12/09/2016   Plantar fasciitis 12/09/2016   Primary osteoarthritis of both feet 12/09/2016   Primary osteoarthritis of both hands 12/09/2016   DDD (degenerative disc disease), lumbar 12/09/2016   Lateral epicondylitis, right elbow 12/09/2016   Lumbar radiculopathy 02/25/2016   DDD (degenerative disc disease), cervical 10/19/2012   PVCs (premature ventricular contractions) 03/12/2011   Vitamin D deficiency 01/01/2008   DJD (degenerative joint disease), multiple sites 01/01/2008    Current Outpatient Medications on File Prior to Visit   Medication Sig Dispense Refill   acetaminophen (TYLENOL) 500 MG tablet Take by mouth as needed.      Cholecalciferol (VITAMIN D3) 2000 UNITS TABS Take 5,000 Units by mouth daily.     diclofenac sodium (VOLTAREN) 1 % GEL Apply 4 g topically 4 (four) times daily. (Patient taking differently: Apply 4 g topically as needed.) 100 g 3   estradiol (VIVELLE-DOT) 0.075 MG/24HR Place 1 patch onto the skin 2 (two) times a week. 24 patch 2   fexofenadine (ALLEGRA) 180 MG tablet Take 180 mg by mouth as needed.     meloxicam (MOBIC) 15 MG tablet Take 15 mg by mouth daily.     Misc Natural Products (TART CHERRY ADVANCED PO) Take by mouth.     progesterone (PROMETRIUM) 100 MG capsule Take 1 capsule (100 mg total) by mouth at bedtime. 90 capsule 2   TURMERIC PO Take by mouth daily.     Zinc 25 MG TABS Take 1 tablet by mouth daily. 20MG      No current facility-administered medications on file prior to visit.    Past Medical History:  Diagnosis Date   Allergy    Arthritis    Cancer ([place]) 2019   basal cell on chest and back   Cyst    hip; probable epidemoid inclusion cyst; resolution with antibiotics   Right knee meniscal tear  07/2011   Dr Hal Morales, Ortho; S/P cortisone injection   Spinal stenosis at L4-L5 level 2017   bilateral   Squamous cell carcinoma of leg, left 2022   Tear of left acetabular labrum 2006 ?   Dr Sallyanne Havers  Thyroid nodule    dx by Dr. Maxie Better   Vitamin D deficiency     Past Surgical History:  Procedure Laterality Date   ANKLE SURGERY  1989    Post fracture    DILATION AND CURETTAGE OF UTERUS  1988   FOOT SURGERY     left- bone spurs, Dr Mayer Camel   HAND SURGERY     Thumb surgery x 2   KNEE ARTHROPLASTY     KNEE ARTHROSCOPY Right 2013   LAPAROTOMY  1988   For Dysmenorrhea   REPLACEMENT TOTAL KNEE Right 11/2018    Social History   Socioeconomic History   Marital status: Single    Spouse name: Not on file   Number of children: 0   Years of education: Not on file    Highest education level: Not on file  Occupational History   Occupation: Self-employed Ecologist  Tobacco Use   Smoking status: Never   Smokeless tobacco: Never  Vaping Use   Vaping Use: Never used  Substance and Sexual Activity   Alcohol use: Yes    Comment: 3 glasses of wine or beer a week   Drug use: No   Sexual activity: Not Currently    Partners: Male  Other Topics Concern   Not on file  Social History Narrative   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  Walks dog, gardening, PT for back   Social Determinants of Health   Financial Resource Strain: Not on file  Food Insecurity: Not on file  Transportation Needs: Not on file  Physical Activity: Not on file  Stress: Not on file  Social Connections: Not on file    Family History  Problem Relation Age of Onset   Parkinsonism Father    Colon polyps Father        No colon cancer   Transient ischemic attack Father    Breast cancer Mother 36   Coronary artery disease Mother        ? radiation related   Arthritis Mother        DJD   Kidney disease Mother    Cancer Mother    Hypertension Mother    Hyperlipidemia Mother    Thyroid disease Mother    Arthritis Maternal Grandmother        DJD   Diabetes Maternal Grandfather    Heart attack Neg Hx     Review of Systems     Objective:   Vitals:   01/05/21 1124  BP: 128/78  Pulse: (!) 56  Temp: 98.3 F (36.8 C)  SpO2: 99%   BP Readings from Last 3 Encounters:  01/05/21 128/78  07/10/20 (!) 140/92  06/19/20 122/60   Wt Readings from Last 3 Encounters:  01/05/21 160 lb (72.6 kg)  07/10/20 159 lb 12.8 oz (72.5 kg)  06/19/20 157 lb (71.2 kg)   Body mass index is 25.06 kg/m.   Physical Exam Constitutional:      General: She is not in acute distress.    Appearance: Normal appearance. She is not ill-appearing.  HENT:     Head: Normocephalic and atraumatic.  Cardiovascular:     Pulses: Normal pulses.  Musculoskeletal:     Right lower leg: No  edema.     Left lower leg: No edema.  Skin:    General: Skin is warm and dry.     Findings: No erythema or rash.  Neurological:     Mental Status: She is alert.     Sensory: No  sensory deficit (No decree sensation in toes).     Motor: No weakness.           Assessment & Plan:    See Problem List for Assessment and Plan of chronic medical problems.

## 2021-01-05 ENCOUNTER — Ambulatory Visit (INDEPENDENT_AMBULATORY_CARE_PROVIDER_SITE_OTHER): Payer: BC Managed Care – PPO | Admitting: Internal Medicine

## 2021-01-05 ENCOUNTER — Other Ambulatory Visit: Payer: Self-pay

## 2021-01-05 ENCOUNTER — Encounter: Payer: Self-pay | Admitting: Internal Medicine

## 2021-01-05 VITALS — BP 128/78 | HR 56 | Temp 98.3°F | Ht 67.0 in | Wt 160.0 lb

## 2021-01-05 DIAGNOSIS — Z1211 Encounter for screening for malignant neoplasm of colon: Secondary | ICD-10-CM

## 2021-01-05 DIAGNOSIS — R202 Paresthesia of skin: Secondary | ICD-10-CM | POA: Diagnosis not present

## 2021-01-05 LAB — COMPREHENSIVE METABOLIC PANEL
ALT: 15 U/L (ref 0–35)
AST: 18 U/L (ref 0–37)
Albumin: 4.3 g/dL (ref 3.5–5.2)
Alkaline Phosphatase: 68 U/L (ref 39–117)
BUN: 15 mg/dL (ref 6–23)
CO2: 28 mEq/L (ref 19–32)
Calcium: 9.6 mg/dL (ref 8.4–10.5)
Chloride: 103 mEq/L (ref 96–112)
Creatinine, Ser: 0.96 mg/dL (ref 0.40–1.20)
GFR: 64.94 mL/min (ref >60.00)
Glucose, Bld: 90 mg/dL (ref 70–99)
Potassium: 3.7 mEq/L (ref 3.5–5.1)
Sodium: 139 mEq/L (ref 135–145)
Total Bilirubin: 0.6 mg/dL (ref 0.2–1.2)
Total Protein: 7 g/dL (ref 6.0–8.3)

## 2021-01-05 LAB — CBC WITH DIFFERENTIAL/PLATELET
Basophils Absolute: 0 10*3/uL (ref 0.0–0.1)
Basophils Relative: 0.9 % (ref 0.0–3.0)
Eosinophils Absolute: 0.1 10*3/uL (ref 0.0–0.7)
Eosinophils Relative: 1.6 % (ref 0.0–5.0)
HCT: 37.7 % (ref 36.0–46.0)
Hemoglobin: 12.2 g/dL (ref 12.0–15.0)
Lymphocytes Relative: 27 % (ref 12.0–46.0)
Lymphs Abs: 1.4 10*3/uL (ref 0.7–4.0)
MCHC: 32.5 g/dL (ref 30.0–36.0)
MCV: 91.9 fl (ref 78.0–100.0)
Monocytes Absolute: 0.4 10*3/uL (ref 0.1–1.0)
Monocytes Relative: 8.5 % (ref 3.0–12.0)
Neutro Abs: 3.2 10*3/uL (ref 1.4–7.7)
Neutrophils Relative %: 62 % (ref 43.0–77.0)
Platelets: 244 10*3/uL (ref 150.0–400.0)
RBC: 4.1 Mil/uL (ref 3.87–5.11)
RDW: 15.1 % (ref 11.5–15.5)
WBC: 5.1 10*3/uL (ref 4.0–10.5)

## 2021-01-05 LAB — VITAMIN B12: Vitamin B-12: 651 pg/mL (ref 211–911)

## 2021-01-05 LAB — HEMOGLOBIN A1C: Hgb A1c MFr Bld: 5.5 % (ref 4.6–6.5)

## 2021-01-05 NOTE — Assessment & Plan Note (Addendum)
Acute Tingling in toes-both feet Started the beginning of the year-several months ago New or associated symptoms, no tingling in fingers Check B12 level to rule out deficiency ?  Peripheral neuropathy Also check ANA, CBC, CMP, A1c ?  Related to lumbar spine or cervical spine disease-she does plan on seeing neurosurgery at some point

## 2021-01-05 NOTE — Patient Instructions (Addendum)
  Blood work was ordered.     Medications changes include :   none     

## 2021-01-06 LAB — ANA: Anti Nuclear Antibody (ANA): NEGATIVE

## 2021-01-07 ENCOUNTER — Encounter: Payer: Self-pay | Admitting: Internal Medicine

## 2021-01-19 ENCOUNTER — Encounter: Payer: Self-pay | Admitting: Internal Medicine

## 2021-01-19 ENCOUNTER — Other Ambulatory Visit: Payer: Self-pay

## 2021-01-19 ENCOUNTER — Ambulatory Visit (INDEPENDENT_AMBULATORY_CARE_PROVIDER_SITE_OTHER): Payer: BC Managed Care – PPO | Admitting: Internal Medicine

## 2021-01-19 DIAGNOSIS — R109 Unspecified abdominal pain: Secondary | ICD-10-CM | POA: Diagnosis not present

## 2021-01-19 MED ORDER — TRIAMCINOLONE ACETONIDE 0.1 % MT PSTE: 1.0000 "application " | PASTE | Freq: Two times a day (BID) | OROMUCOSAL | 12 refills | Status: DC

## 2021-01-19 MED ORDER — SULFAMETHOXAZOLE-TRIMETHOPRIM 800-160 MG PO TABS: 1.0000 | ORAL_TABLET | Freq: Two times a day (BID) | ORAL | 0 refills | Status: DC

## 2021-01-19 NOTE — Progress Notes (Signed)
   Subjective:   Patient ID: Michele Bennett, female    DOB: April 13, 1960, 60 y.o.   MRN: [id number]  HPI The patient is a 60 YO female coming in for cyst on the right flank present for years painful in the last 2-3 days. Swelling and red and warm to touch. No fevers or chills or nausea or vomiting.   Review of Systems  Constitutional: Negative.   HENT: Negative.    Eyes: Negative.   Respiratory:  Negative for cough, chest tightness and shortness of breath.   Cardiovascular:  Negative for chest pain, palpitations and leg swelling.  Gastrointestinal:  Negative for abdominal distention, abdominal pain, constipation, diarrhea, nausea and vomiting.  Musculoskeletal: Negative.   Skin:  Positive for color change.  Neurological: Negative.   Psychiatric/Behavioral: Negative.     Objective:  Physical Exam Constitutional:      Appearance: She is well-developed.  HENT:     Head: Normocephalic and atraumatic.  Cardiovascular:     Rate and Rhythm: Normal rate and regular rhythm.  Pulmonary:     Effort: Pulmonary effort is normal. No respiratory distress.     Breath sounds: Normal breath sounds. No wheezing or rales.  Abdominal:     General: Bowel sounds are normal. There is no distension.     Palpations: Abdomen is soft.     Tenderness: There is no abdominal tenderness. There is no rebound.  Musculoskeletal:     Cervical back: Normal range of motion.  Skin:    General: Skin is warm and dry.     Comments: Right flank cyst about 1 cm circular and hard tender to touch.   Neurological:     Mental Status: She is alert and oriented to person, place, and time.     Coordination: Coordination normal.    Vitals:   01/19/21 1459  BP: 114/70  Pulse: 82  Resp: 18  SpO2: 98%  Weight: 159 lb (72.1 kg)  Height: 5\' 7"  (1.702 m)    This visit occurred during the SARS-CoV-2 public health emergency.  Safety protocols were in place, including screening questions prior to the visit,  additional usage of staff PPE, and extensive cleaning of exam room while observing appropriate contact time as indicated for disinfecting solutions.   Assessment & Plan:

## 2021-01-19 NOTE — Patient Instructions (Signed)
We have sent in bactrim 1 pill to take twice a day for 1 week.  We have refilled the cream for the mouth.

## 2021-01-20 ENCOUNTER — Ambulatory Visit: Payer: BC Managed Care – PPO | Admitting: Internal Medicine

## 2021-01-21 DIAGNOSIS — R109 Unspecified abdominal pain: Secondary | ICD-10-CM | POA: Insufficient documentation

## 2021-01-21 NOTE — Assessment & Plan Note (Signed)
Rx bactrim 1 week course for infected sebaceous cyst. Not amenable to I and D during visit.

## 2021-01-26 ENCOUNTER — Encounter: Payer: Self-pay | Admitting: Internal Medicine

## 2021-01-26 ENCOUNTER — Other Ambulatory Visit: Payer: Self-pay

## 2021-01-26 ENCOUNTER — Ambulatory Visit: Payer: BC Managed Care – PPO | Admitting: Internal Medicine

## 2021-01-26 ENCOUNTER — Ambulatory Visit (INDEPENDENT_AMBULATORY_CARE_PROVIDER_SITE_OTHER): Payer: BC Managed Care – PPO | Admitting: Internal Medicine

## 2021-01-26 VITALS — BP 116/68 | HR 80 | Temp 98.3°F | Ht 67.0 in | Wt 157.0 lb

## 2021-01-26 DIAGNOSIS — L089 Local infection of the skin and subcutaneous tissue, unspecified: Secondary | ICD-10-CM

## 2021-01-26 DIAGNOSIS — L723 Sebaceous cyst: Secondary | ICD-10-CM | POA: Diagnosis not present

## 2021-01-26 MED ORDER — DOXYCYCLINE HYCLATE 100 MG PO TABS: 100.0000 mg | ORAL_TABLET | Freq: Two times a day (BID) | ORAL | 0 refills | Status: AC

## 2021-01-26 NOTE — Patient Instructions (Signed)
     Medications changes include :   doxycyline 100 mg twice daily x 10 days   Lots of warm compresses.   Your prescription(s) have been submitted to your pharmacy. Please take as directed and contact our office if you believe you are having problem(s) with the medication(s).   Please call if there is no improvement in your symptoms.

## 2021-01-26 NOTE — Assessment & Plan Note (Signed)
Acute Was seen last week for this and took Bactrim DS twice daily x1 week without improvement-actually infection is worse Advised her to do warm compresses as much as possible, which I think will help significantly Start doxycycline 100 mg twice daily x10 days She will call if she is not seeing improvement-May need to lance it, but we will hold off on that today since most of the area is indurated and will not likely drain

## 2021-01-26 NOTE — Progress Notes (Signed)
Subjective:    Patient ID: Michele Bennett, female    DOB: 1960-07-16, 60 y.o.   MRN: [id number]  This visit occurred during the SARS-CoV-2 public health emergency.  Safety protocols were in place, including screening questions prior to the visit, additional usage of staff PPE, and extensive cleaning of exam room while observing appropriate contact time as indicated for disinfecting solutions.    HPI The patient is here for an acute visit.   Infected cyst - saw Dr Sharlet Salina last week and was diagnosed with an infected sebaceous cyst.  She states she has had this cyst for a long time and it was infected about 10 years ago once, but has not had any issues with it since then.  She was prescribed Bactrim DS twice daily x1 week, which she did complete.  The area is not better, but she states is slightly worse.  It is larger, darker red and is still painful and warm to touch.  She denies any fevers.    Medications and allergies reviewed with patient and updated if appropriate.  Patient Active Problem List   Diagnosis Date Noted   Right flank pain 01/21/2021   Tingling of both feet 01/05/2021   Multiple thyroid nodules 07/10/2020   History of SCC (squamous cell carcinoma) of skin 05/16/2020   Thyroid nodule-benign-no follow-up needed 12/12/2019   Spondylosis without myelopathy or radiculopathy, lumbar region 03/14/2019   Chest tightness 10/05/2018   Primary osteoarthritis of both knees 12/09/2016   Plantar fasciitis 12/09/2016   Primary osteoarthritis of both feet 12/09/2016   Primary osteoarthritis of both hands 12/09/2016   DDD (degenerative disc disease), lumbar 12/09/2016   Lateral epicondylitis, right elbow 12/09/2016   Lumbar radiculopathy 02/25/2016   DDD (degenerative disc disease), cervical 10/19/2012   PVCs (premature ventricular contractions) 03/12/2011   Vitamin D deficiency 01/01/2008   DJD (degenerative joint disease), multiple sites 01/01/2008    Current  Outpatient Medications on File Prior to Visit  Medication Sig Dispense Refill   acetaminophen (TYLENOL) 500 MG tablet Take by mouth as needed.      Cholecalciferol (VITAMIN D3) 2000 UNITS TABS Take 5,000 Units by mouth daily.     diclofenac sodium (VOLTAREN) 1 % GEL Apply 4 g topically 4 (four) times daily. (Patient taking differently: Apply 4 g topically as needed.) 100 g 3   estradiol (VIVELLE-DOT) 0.075 MG/24HR Place 1 patch onto the skin 2 (two) times a week. 24 patch 2   fexofenadine (ALLEGRA) 180 MG tablet Take 180 mg by mouth as needed.     meloxicam (MOBIC) 15 MG tablet Take 15 mg by mouth daily.     Misc Natural Products (TART CHERRY ADVANCED PO) Take by mouth.     progesterone (PROMETRIUM) 100 MG capsule Take 1 capsule (100 mg total) by mouth at bedtime. 90 capsule 2   triamcinolone (KENALOG) 0.1 % paste Use as directed 1 application in the mouth or throat 2 (two) times daily. 5 g 12   TURMERIC PO Take by mouth daily.     Zinc 25 MG TABS Take 1 tablet by mouth daily. 20MG      No current facility-administered medications on file prior to visit.    Past Medical History:  Diagnosis Date   Allergy    Arthritis    Cancer ([place]) 2019   basal cell on chest and back   Cyst    hip; probable epidemoid inclusion cyst; resolution with antibiotics   Right knee meniscal tear  07/2011  Dr Hal Morales, Ortho; S/P cortisone injection   Spinal stenosis at L4-L5 level 2017   bilateral   Squamous cell carcinoma of leg, left 2022   Tear of left acetabular labrum 2006 ?   Dr Sallyanne Havers   Thyroid nodule    dx by Dr. Maxie Better   Vitamin D deficiency     Past Surgical History:  Procedure Laterality Date   ANKLE SURGERY  1989    Post fracture    DILATION AND CURETTAGE OF UTERUS  1988   FOOT SURGERY     left- bone spurs, Dr Mayer Camel   HAND SURGERY     Thumb surgery x 2   KNEE ARTHROPLASTY     KNEE ARTHROSCOPY Right 2013   LAPAROTOMY  1988   For Dysmenorrhea   REPLACEMENT TOTAL KNEE Right 11/2018     Social History   Socioeconomic History   Marital status: Single    Spouse name: Not on file   Number of children: 0   Years of education: Not on file   Highest education level: Not on file  Occupational History   Occupation: Self-employed Ecologist  Tobacco Use   Smoking status: Never   Smokeless tobacco: Never  Vaping Use   Vaping Use: Never used  Substance and Sexual Activity   Alcohol use: Yes    Comment: 3 glasses of wine or beer a week   Drug use: No   Sexual activity: Not Currently    Partners: Male  Other Topics Concern   Not on file  Social History Narrative   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  Walks dog, gardening, PT for back   Social Determinants of Health   Financial Resource Strain: Not on file  Food Insecurity: Not on file  Transportation Needs: Not on file  Physical Activity: Not on file  Stress: Not on file  Social Connections: Not on file    Family History  Problem Relation Age of Onset   Parkinsonism Father    Colon polyps Father        No colon cancer   Transient ischemic attack Father    Breast cancer Mother 42   Coronary artery disease Mother        ? radiation related   Arthritis Mother        DJD   Kidney disease Mother    Cancer Mother    Hypertension Mother    Hyperlipidemia Mother    Thyroid disease Mother    Arthritis Maternal Grandmother        DJD   Diabetes Maternal Grandfather    Heart attack Neg Hx     Review of Systems     Objective:   Vitals:   01/26/21 1451  BP: 116/68  Pulse: 80  Temp: 98.3 F (36.8 C)  SpO2: 98%   BP Readings from Last 3 Encounters:  01/26/21 116/68  01/19/21 114/70  01/05/21 128/78   Wt Readings from Last 3 Encounters:  01/26/21 157 lb (71.2 kg)  01/19/21 159 lb (72.1 kg)  01/05/21 160 lb (72.6 kg)   Body mass index is 24.59 kg/m.   Physical Exam Constitutional:      General: She is not in acute distress.    Appearance: Normal appearance. She is not  ill-appearing.  HENT:     Head: Normocephalic and atraumatic.  Skin:    General: Skin is warm and dry.     Comments: Tangerine sized area of erythematous induration on right lateral lower abdomen, central area  there is slight fluctuance.  Area is warm and tender.  Neurological:     Mental Status: She is alert.           Assessment & Plan:    See Problem List for Assessment and Plan of chronic medical problems.

## 2021-01-31 ENCOUNTER — Encounter: Payer: Self-pay | Admitting: Internal Medicine

## 2021-04-27 HISTORY — PX: REPLACEMENT TOTAL KNEE: SUR1224

## 2021-05-02 ENCOUNTER — Other Ambulatory Visit: Payer: Self-pay | Admitting: Obstetrics and Gynecology

## 2021-05-05 NOTE — Telephone Encounter (Signed)
Last AEX 06/19/20--scheduled 06/24/21. Last mammo 10/06/20.

## 2021-06-23 NOTE — Progress Notes (Signed)
61 y.o. G0P0000 Single Caucasian female here for annual exam.   ? ?On HRT. ?Hot flashes and night sweats are controlled. ?She wants to continue while she is recovering from her knee surgery.  ?She is not sleeping due to discomfort.  ? ?No vaginal bleeding.  ? ?Not sexually active.  ? ?Having recurrent right groin pain and internal muscle discomfort that comes and goes.  ?She also has urinary incontinence with a sneeze. ? ?Had a second knee replacement.   ? ?Not taking antiviral medication for HSV for a decade.  ? ?PCP:  Billey Gosling, MD ? ?Patient's last menstrual period was 02/23/2018 (approximate).     ?  ?    ?Sexually active: No.  ?The current method of family planning is post menopausal status.    ?Exercising: Yes.     Walking and gardening ?Smoker:  no ? ?Health Maintenance: ?Pap:  04-22-17 neg:Neg HR HPV, 03-18-14 Neg:Neg HR HPV ?History of abnormal Pap:  no ?MMG:  10-06-20 Neg/BiRads1 ?Colonoscopy:  05-25-13, negative; due at 23 ?BMD:  12-12-19    Result :Normal ?TDaP:  12-16-15 ?Gardasil:   n/a ?HIV: 05-31-12 NR ?Hep C: 05-31-12 Neg ?Screening Labs:  PCP ? ? reports that she has never smoked. She has never used smokeless tobacco. She reports current alcohol use. She reports that she does not use drugs. ? ?Past Medical History:  ?Diagnosis Date  ? Allergy   ? Arthritis   ? Cancer [facility]) 2019  ? basal cell on chest and back  ? Cyst   ? hip; probable epidemoid inclusion cyst; resolution with antibiotics  ? Right knee meniscal tear  07/2011  ? Dr Hal Morales, Ortho; S/P cortisone injection  ? Spinal stenosis at L4-L5 level 2017  ? bilateral  ? Squamous cell carcinoma of leg, left 2022  ? Tear of left acetabular labrum 2006 ?  ? Dr Sallyanne Havers  ? Thyroid nodule   ? dx by Dr. Maxie Better  ? Vitamin D deficiency   ? ? ?Past Surgical History:  ?Procedure Laterality Date  ? [place]  ? Post fracture   ? DILATION AND CURETTAGE OF UTERUS  1988  ? FOOT SURGERY    ? left- bone spurs, Dr Mayer Camel  ? HAND SURGERY    ? Thumb surgery x 2  ?  KNEE ARTHROPLASTY    ? KNEE ARTHROSCOPY Right 2013  ? LAPAROTOMY  1988  ? For Dysmenorrhea  ? REPLACEMENT TOTAL KNEE Right 11/2018  ? REPLACEMENT TOTAL KNEE Left 04/27/2021  ? ? ?Current Outpatient Medications  ?Medication Sig Dispense Refill  ? acetaminophen (TYLENOL) 500 MG tablet Take by mouth as needed.     ? Cholecalciferol (VITAMIN D3) 2000 UNITS TABS Take 5,000 Units by mouth daily.    ? diclofenac sodium (VOLTAREN) 1 % GEL Apply 4 g topically 4 (four) times daily. (Patient taking differently: Apply 4 g topically as needed.) 100 g 3  ? DOTTI 0.075 MG/24HR PLACE ONE PATCH ONTO THE SKIN TWO TIMES A WEEK 16 patch 0  ? fexofenadine (ALLEGRA) 180 MG tablet Take 180 mg by mouth as needed.    ? magnesium oxide (MAG-OX) 400 MG tablet Take 400 mg by mouth daily.    ? meloxicam (MOBIC) 15 MG tablet Take 15 mg by mouth daily.    ? Misc Natural Products (TART CHERRY ADVANCED PO) Take by mouth.    ? progesterone (PROMETRIUM) 100 MG capsule Take 1 capsule (100 mg total) by mouth at bedtime. 90 capsule 2  ?  triamcinolone (KENALOG) 0.1 % paste Use as directed 1 application in the mouth or throat 2 (two) times daily. 5 g 12  ? TURMERIC PO Take by mouth daily.    ? Zinc 25 MG TABS Take 1 tablet by mouth daily. '20MG'$     ? zolpidem (AMBIEN CR) 6.25 MG CR tablet Take 6.25 mg by mouth at bedtime as needed.    ? ?No current facility-administered medications for this visit.  ? ? ?Family History  ?Problem Relation Age of Onset  ? Parkinsonism Father   ? Colon polyps Father   ?     No colon cancer  ? Transient ischemic attack Father   ? Breast cancer Mother 75  ? Coronary artery disease Mother   ?     ? radiation related  ? Arthritis Mother   ?     DJD  ? Kidney disease Mother   ? Cancer Mother   ? Hypertension Mother   ? Hyperlipidemia Mother   ? Thyroid disease Mother   ? Arthritis Maternal Grandmother   ?     DJD  ? Diabetes Maternal Grandfather   ? Heart attack Neg Hx   ? ? ?Review of Systems  ?All other systems reviewed and are  negative. ? ?Exam:   ?BP 130/70   Pulse 81   Ht '5\' 7"'$  (1.702 m)   Wt 155 lb (70.3 kg)   LMP 02/23/2018 (Approximate)   SpO2 98%   BMI 24.28 kg/m?     ?General appearance: alert, cooperative and appears stated age ?Head: normocephalic, without obvious abnormality, atraumatic ?Neck: no adenopathy, supple, symmetrical, trachea midline and thyroid normal to inspection and palpation ?Lungs: clear to auscultation bilaterally ?Breasts: normal appearance, no masses or tenderness, No nipple retraction or dimpling, No nipple discharge or bleeding, No axillary adenopathy ?Heart: regular rate and rhythm ?Abdomen: soft, non-tender; no masses, no organomegaly ?Extremities: extremities normal, atraumatic, no cyanosis or edema ?Skin: skin color, texture, turgor normal. No rashes or lesions ?Lymph nodes: cervical, supraclavicular, and axillary nodes normal. ?Neurologic: grossly normal ? ?Pelvic: External genitalia:  no lesions ?             No abnormal inguinal nodes palpated. ?             Urethra:  normal appearing urethra with no masses, tenderness or lesions ?             Bartholins and Skenes: normal    ?             Vagina: normal appearing vagina with normal color and discharge, no lesions ?             Cervix: no lesions ?             Pap taken: no ?Bimanual Exam:  Uterus:  normal size, contour, position, consistency, mobility, non-tender ?             Adnexa: no mass, fullness, tenderness ?             Rectal exam: yes.  Confirms. ?             Anus:  normal sphincter tone, no lesions ? ?Chaperone was present for exam:  Kimalexis, CMA ? ?Assessment:   ?Well woman visit with gynecologic exam. ?HRT. ?Fibroids.  ?Cervical stenosis.  ?Groin pain.  ?Stress incontinence. ?Hx HSV 2.  ?FH post menopausal breast cancer.  ? ?Plan: ?Mammogram screening discussed. ?Self breast awareness reviewed. ?Pap and HR HPV 2024. ?Guidelines  for Calcium, Vitamin D, regular exercise program including cardiovascular and weight bearing  exercise. ?Rx for Dotti 0.75 mg twice weekly and Prometrium 100 mg q hs.   ?Discused WHI and use of HRT which can increase risk of PE, DVT, MI, stroke and breast cancer.  ?Referral for pelvic floor therapy.  ?Follow up annually and prn.  ?  ?After visit summary provided.  ? ? ? ?

## 2021-06-24 ENCOUNTER — Ambulatory Visit (INDEPENDENT_AMBULATORY_CARE_PROVIDER_SITE_OTHER): Payer: BC Managed Care – PPO | Admitting: Obstetrics and Gynecology

## 2021-06-24 ENCOUNTER — Encounter: Payer: Self-pay | Admitting: Obstetrics and Gynecology

## 2021-06-24 ENCOUNTER — Telehealth: Payer: Self-pay | Admitting: Obstetrics and Gynecology

## 2021-06-24 VITALS — BP 130/70 | HR 81 | Ht 67.0 in | Wt 155.0 lb

## 2021-06-24 DIAGNOSIS — N393 Stress incontinence (female) (male): Secondary | ICD-10-CM | POA: Diagnosis not present

## 2021-06-24 DIAGNOSIS — Z01419 Encounter for gynecological examination (general) (routine) without abnormal findings: Secondary | ICD-10-CM | POA: Diagnosis not present

## 2021-06-24 DIAGNOSIS — R1031 Right lower quadrant pain: Secondary | ICD-10-CM | POA: Diagnosis not present

## 2021-06-24 MED ORDER — PROGESTERONE MICRONIZED 100 MG PO CAPS: 100.0000 mg | ORAL_CAPSULE | Freq: Every day | ORAL | 3 refills | Status: DC

## 2021-06-24 MED ORDER — ESTRADIOL 0.075 MG/24HR TD PTTW: MEDICATED_PATCH | TRANSDERMAL | 3 refills | Status: DC

## 2021-06-24 NOTE — Patient Instructions (Signed)

## 2021-06-24 NOTE — Telephone Encounter (Signed)
Referral sent 

## 2021-06-24 NOTE — Telephone Encounter (Signed)
Please make a referral for pelvic floor therapy with [facility] for right groin pain and genuine stress incontinence.  ?

## 2021-06-26 ENCOUNTER — Encounter: Payer: Self-pay | Admitting: Obstetrics and Gynecology

## 2021-06-28 NOTE — Telephone Encounter (Signed)
Has appointment on 07/02/21. ? ?Encounter closed.  ?

## 2021-07-02 ENCOUNTER — Ambulatory Visit: Payer: BC Managed Care – PPO | Attending: Obstetrics and Gynecology

## 2021-07-02 DIAGNOSIS — M6281 Muscle weakness (generalized): Secondary | ICD-10-CM | POA: Diagnosis not present

## 2021-07-02 DIAGNOSIS — R293 Abnormal posture: Secondary | ICD-10-CM | POA: Diagnosis not present

## 2021-07-02 DIAGNOSIS — N393 Stress incontinence (female) (male): Secondary | ICD-10-CM | POA: Insufficient documentation

## 2021-07-02 DIAGNOSIS — R1031 Right lower quadrant pain: Secondary | ICD-10-CM | POA: Diagnosis not present

## 2021-07-02 DIAGNOSIS — M62838 Other muscle spasm: Secondary | ICD-10-CM | POA: Insufficient documentation

## 2021-07-02 NOTE — Therapy (Signed)
?OUTPATIENT PHYSICAL THERAPY FEMALE PELVIC EVALUATION ? ? ?Patient Name: Michele Bennett ?MRN: [id number] ?DOB:April 12, 1960, 61 y.o., female ?Today's Date: 07/02/2021 ? ? PT End of Session - 07/02/21 1529   ? ? Visit Number 1   ? Date for PT Re-Evaluation 09/24/21   ? Authorization Type BCBS   ? PT Start Time 1530   ? PT Stop Time 1628   ? PT Time Calculation (min) 58 min   ? ?  ?  ? ?  ? ? ?Past Medical History:  ?Diagnosis Date  ? Allergy   ? Arthritis   ? Cancer [facility]) 2019  ? basal cell on chest and back  ? Cyst   ? hip; probable epidemoid inclusion cyst; resolution with antibiotics  ? Right knee meniscal tear  07/2011  ? Dr Hal Morales, Ortho; S/P cortisone injection  ? Spinal stenosis at L4-L5 level 2017  ? bilateral  ? Squamous cell carcinoma of leg, left 2022  ? Tear of left acetabular labrum 2006 ?  ? Dr Sallyanne Havers  ? Thyroid nodule   ? dx by Dr. Maxie Better  ? Vitamin D deficiency   ? ?Past Surgical History:  ?Procedure Laterality Date  ? [place]  ? Post fracture   ? DILATION AND CURETTAGE OF UTERUS  1988  ? FOOT SURGERY    ? left- bone spurs, Dr Mayer Camel  ? HAND SURGERY    ? Thumb surgery x 2  ? KNEE ARTHROPLASTY    ? KNEE ARTHROSCOPY Right 2013  ? LAPAROTOMY  1988  ? For Dysmenorrhea  ? REPLACEMENT TOTAL KNEE Right 11/2018  ? REPLACEMENT TOTAL KNEE Left 04/27/2021  ? ?Patient Active Problem List  ? Diagnosis Date Noted  ? Right flank pain 01/21/2021  ? Tingling of both feet 01/05/2021  ? Multiple thyroid nodules 07/10/2020  ? History of SCC (squamous cell carcinoma) of skin 05/16/2020  ? Thyroid nodule-benign-no follow-up needed 12/12/2019  ? Spondylosis without myelopathy or radiculopathy, lumbar region 03/14/2019  ? Chest tightness 10/05/2018  ? Primary osteoarthritis of both knees 12/09/2016  ? Plantar fasciitis 12/09/2016  ? Primary osteoarthritis of both feet 12/09/2016  ? Primary osteoarthritis of both hands 12/09/2016  ? DDD (degenerative disc disease), lumbar 12/09/2016  ? Lateral epicondylitis,  right elbow 12/09/2016  ? Lumbar radiculopathy 02/25/2016  ? DDD (degenerative disc disease), cervical 10/19/2012  ? PVCs (premature ventricular contractions) 03/12/2011  ? Infected sebaceous cyst of skin 10/14/2010  ? Vitamin D deficiency 01/01/2008  ? DJD (degenerative joint disease), multiple sites 01/01/2008  ? ? ?PCP: Binnie Rail, MD ? ?REFERRING PROVIDER: Aundria Rud* ? ?REFERRING DIAG: N39.3 (ICD-10-CM) - Stress incontinence ?R10.31 (ICD-10-CM) - Right inguinal pain ? ?THERAPY DIAG:  ?Abnormal posture - Plan: PT plan of care cert/re-cert ? ?Muscle weakness (generalized) - Plan: PT plan of care cert/re-cert ? ?Other muscle spasm - Plan: PT plan of care cert/re-cert ? ?ONSET DATE: 09/15/20 ? ?SUBJECTIVE:                                                                                                                                                                                          ? ?  SUBJECTIVE STATEMENT: ?Pt started having Rt sided groin/pelvic pain that was severe last summer, but is now more intermittent than it was initially. She feels like she would have this pain intermittently prior to Rt knee replacement as well. She had Rt knee replacement in 11/2018 that was very difficult and was accompanied by Rt sciatica; she had Lt total knee replacement 8 weeks ago and states that it went much better and she was just released this morning.  ?Fluid intake: Yes: -   ? ?Patient confirms identification and approves PT to assess pelvic floor and treatment Yes ? ? ?PRECAUTIONS: None ? ?WEIGHT BEARING RESTRICTIONS No ? ?FALLS:  ?Has patient fallen in last 6 months? No ? ?LIVING ENVIRONMENT: ?Lives with: lives with their family ?Lives in: House/apartment ? ?OCCUPATION: partially retired - horticulture ? ?PLOF: Independent ? ?PATIENT GOALS decrease Rt pelvic pain ? ?PERTINENT HISTORY:  ?Bil TKA ?Sexual abuse: No ? ?BOWEL MOVEMENT ?Pain with bowel movement: No ?Type of bowel movement:Frequency every other  day and Strain No ?Fully empty rectum: No ?Leakage: No ?Pads: No ?Fiber supplement: No ? ?URINATION ?Pain with urination: No ?Fully empty bladder: Yes: - ?Stream:  normal ?Urgency: No ?Frequency: WNL ?Leakage: Coughing, Sneezing, and Laughing ?Pads: No ? ?INTERCOURSE ?Pain with intercourse: Initial Penetration ?Ability to have vaginal penetration:  Yes: not comfortably and no longer trying ?Climax: yes ?Marinoff Scale: 0/3 ? ?PREGNANCY ?Vaginal deliveries 0 ?C-section deliveries 0 ? ? ? ? ? ? ?OBJECTIVE:  ? ? ?COGNITION: ? Overall cognitive status: Within functional limits for tasks assessed   ?  ?SENSATION: ? Light touch: Appears intact ? Proprioception: Appears intact ? ? ?FUNCTIONAL TESTS:  ?-Squat - Rt weight shift ?-Single leg stance: Lt WNL, Rt WNL but pelvic tightness - pt states that if she was in pain flare up that would bother her ?-Lt single leg stance pulling Rt shoe off causes pain in Rt groin ? ?GAIT: ?WNL ? ?POSTURE:  ?Rt lumbar scoliosis; reduced spinal curvature throughout ? ?LUMBARAROM/PROM ?Flexion 100%, extension 25%, bil side bend 100%, Rt anterior hip pinching with Lt rotation ? ? ?LE ROM: ? Pain with Rt hip IR ? Scour (-) Rt ? ? ?LE MMT: ? Bil hip flexion 4/5, Bil hip IR/ER 4/5, Bil hip extension/abduction/adduction 3+/5 ? ?PELVIC MMT: not assessed today ?  ? ?      PALPATION: ?  General  Tenderness to palpation in Rt lower abdomen/hip flexors, adductors, obturator internal/iscial tuberosity, Rt gluteals/hip rotators with sig trigger points ? ?              External Perineal Exam NA ?              ?              Internal Pelvic Floor NA ? ?TONE: ?NA ? ?PROLAPSE: ?NA ? ?TODAY'S TREATMENT 07/02/21 ?EVAL  ?Manual: ?Soft tissue mobilization: ?Rt obturator internus ?Scar tissue mobilization: ?Myofascial release: ?Spinal mobilization: ?Internal pelvic floor techniques: ?Dry needling: ?Rt gluteals/hip rotators ?Neuromuscular re-education: ?Core retraining:  ?Core facilitation: ?Form  correction: ?Pelvic floor contraction training: ?Down training: ?Exercises: ?Stretches/mobility: ?Standing forward fold with bil hip IR 10 breaths ?Rt bent knee fall in with hip hiking 3 x 10 ?Strengthening: ?Therapeutic activities: ?Functional strengthening activities: ?Self-care: ? ? ? ? ?PATIENT EDUCATION:  ?Education details: Pt education performed on pelvic floor anatomy, internal pelvic floor exam benefit in future treatment sessions, and initial HEP ?Person educated: Patient ?Education method: Explanation, Demonstration, Tactile cues, Verbal cues, and Handouts ?  Education comprehension: verbalized understanding ? ? ?HOME EXERCISE PROGRAM: ?NA ? ?ASSESSMENT: ? ?CLINICAL IMPRESSION: ?Patient is a 61 y.o. female who was seen today for physical therapy evaluation and treatment for Rt pelvic pain. Exam findings notable for decreased lumbar/hip A/ROM, decreased hip strength, tenderness throughout Rt side of pelvis with pinpoint tenderness over obturator internus reproducing pain, pain reproduction with Lt rotation/Rt single leg stance/Rt pelvic pull, and trigger points/restriction throughout Rt adductors/hip muscles; pt agreeable to pelvic floor exam in the future to help access obturator internus if necessary. Signs and symptoms most consistent with obturator internus/pelvic floor strain/restriction due to hip/core weakness that may have started around first knee replacement. Initial treatment consisted of DN and soft tissue mobilization to Rt gluteals/obturator internus and mobility exercises with good tolerance from patient and decrease in pain with Lt rotation. She will benefit from skilled PT intervention in order to address impairments, decrease pain, and return to functional activities without difficulty.  ? ? ?OBJECTIVE IMPAIRMENTS decreased activity tolerance, decreased endurance, decreased mobility, decreased ROM, decreased strength, hypomobility, increased fascial restrictions, increased muscle spasms,  impaired tone, postural dysfunction, and pain.  ? ?ACTIVITY LIMITATIONS  gardening, dressing .  ? ?PERSONAL FACTORS 1 comorbidity: Bil TKA  are also affecting patient's functional outcome.  ? ? ?REHAB POTENTIAL: Good ? ?CLI

## 2021-07-02 NOTE — Patient Instructions (Signed)

## 2021-07-09 ENCOUNTER — Ambulatory Visit: Payer: BC Managed Care – PPO

## 2021-07-09 DIAGNOSIS — N393 Stress incontinence (female) (male): Secondary | ICD-10-CM | POA: Diagnosis not present

## 2021-07-09 DIAGNOSIS — R293 Abnormal posture: Secondary | ICD-10-CM

## 2021-07-09 DIAGNOSIS — M62838 Other muscle spasm: Secondary | ICD-10-CM

## 2021-07-09 DIAGNOSIS — M6281 Muscle weakness (generalized): Secondary | ICD-10-CM

## 2021-07-09 NOTE — Therapy (Signed)
?OUTPATIENT PHYSICAL THERAPY TREATMENT NOTE ? ? ?Patient Name: Michele Bennett ?MRN: [id number] ?DOB:May 08, 1960, 61 y.o., female ?Today's Date: 07/09/2021 ? ?PCP: Billey Gosling, MD ?REFERRING PROVIDER: Nunzio Cobbs, MD ? ?END OF SESSION:  ? PT End of Session - 07/09/21 1445   ? ? Visit Number 2   ? Date for PT Re-Evaluation 09/24/21   ? Authorization Type BCBS   ? PT Start Time 0175   ? PT Stop Time 1025   ? PT Time Calculation (min) 42 min   ? Activity Tolerance Patient tolerated treatment well   ? Behavior During Therapy [facility] for tasks assessed/performed   ? ?  ?  ? ?  ? ? ?Past Medical History:  ?Diagnosis Date  ? Allergy   ? Arthritis   ? Cancer [facility]) 2019  ? basal cell on chest and back  ? Cyst   ? hip; probable epidemoid inclusion cyst; resolution with antibiotics  ? Right knee meniscal tear  07/2011  ? Dr Hal Morales, Ortho; S/P cortisone injection  ? Spinal stenosis at L4-L5 level 2017  ? bilateral  ? Squamous cell carcinoma of leg, left 2022  ? Tear of left acetabular labrum 2006 ?  ? Dr Sallyanne Havers  ? Thyroid nodule   ? dx by Dr. Maxie Better  ? Vitamin D deficiency   ? ?Past Surgical History:  ?Procedure Laterality Date  ? [place]  ? Post fracture   ? DILATION AND CURETTAGE OF UTERUS  1988  ? FOOT SURGERY    ? left- bone spurs, Dr Mayer Camel  ? HAND SURGERY    ? Thumb surgery x 2  ? KNEE ARTHROPLASTY    ? KNEE ARTHROSCOPY Right 2013  ? LAPAROTOMY  1988  ? For Dysmenorrhea  ? REPLACEMENT TOTAL KNEE Right 11/2018  ? REPLACEMENT TOTAL KNEE Left 04/27/2021  ? ?Patient Active Problem List  ? Diagnosis Date Noted  ? Right flank pain 01/21/2021  ? Tingling of both feet 01/05/2021  ? Multiple thyroid nodules 07/10/2020  ? History of SCC (squamous cell carcinoma) of skin 05/16/2020  ? Thyroid nodule-benign-no follow-up needed 12/12/2019  ? Spondylosis without myelopathy or radiculopathy, lumbar region 03/14/2019  ? Chest tightness 10/05/2018  ? Primary osteoarthritis of both knees 12/09/2016  ? Plantar  fasciitis 12/09/2016  ? Primary osteoarthritis of both feet 12/09/2016  ? Primary osteoarthritis of both hands 12/09/2016  ? DDD (degenerative disc disease), lumbar 12/09/2016  ? Lateral epicondylitis, right elbow 12/09/2016  ? Lumbar radiculopathy 02/25/2016  ? DDD (degenerative disc disease), cervical 10/19/2012  ? PVCs (premature ventricular contractions) 03/12/2011  ? Infected sebaceous cyst of skin 10/14/2010  ? Vitamin D deficiency 01/01/2008  ? DJD (degenerative joint disease), multiple sites 01/01/2008  ? ? ?REFERRING DIAG: N39.3 (ICD-10-CM) - Stress incontinence ?R10.31 (ICD-10-CM) - Right inguinal pain ? ?THERAPY DIAG:  ?Abnormal posture ? ?Muscle weakness (generalized) ? ?Other muscle spasm ? ?PERTINENT HISTORY: Bil TKA ? ?PRECAUTIONS: NA ? ?SUBJECTIVE: Pt states that she was not as sore as she excepted to be. She is having trouble feeling much with exercises. Pain is slightly flared up due to working in garden more today.  ? ?SUBJECTIVE STATEMENT 07/02/21: ?Pt started having Rt sided groin/pelvic pain that was severe last summer, but is now more intermittent than it was initially. She feels like she would have this pain intermittently prior to Rt knee replacement as well. She had Rt knee replacement in 11/2018 that was very difficult and was accompanied by Rt  sciatica; she had Lt total knee replacement 8 weeks ago and states that it went much better and she was just released this morning.  ?Fluid intake: Yes: -   ?  ?Patient confirms identification and approves PT to assess pelvic floor and treatment Yes ?  ?  ?PRECAUTIONS: None ?  ?  ?PATIENT GOALS decrease Rt pelvic pain ?  ?PERTINENT HISTORY:  ?Bil TKA ?Sexual abuse: No ?  ?BOWEL MOVEMENT ?Pain with bowel movement: No ?Type of bowel movement:Frequency every other day and Strain No ?Fully empty rectum: No ?Leakage: No ?Pads: No ?Fiber supplement: No ?  ?URINATION ?Pain with urination: No ?Fully empty bladder: Yes: - ?Stream:  normal ?Urgency:  No ?Frequency: WNL ?Leakage: Coughing, Sneezing, and Laughing ?Pads: No ?  ?INTERCOURSE ?Pain with intercourse: Initial Penetration ?Ability to have vaginal penetration:  Yes: not comfortably and no longer trying ?Climax: yes ?Marinoff Scale: 0/3 ?  ?PREGNANCY ?Vaginal deliveries 0 ?C-section deliveries 0  ?  ?  ?OBJECTIVE 07/09/21: Notable Rt obturator internus restriction and tenderness palpated with internal vaginal assessment  ?  ?  ?OBJECTIVE 07/02/21: ? ?COGNITION: ?           Overall cognitive status: Within functional limits for tasks assessed              ?            ?SENSATION: ?           Light touch: Appears intact ?           Proprioception: Appears intact ?  ?  ?FUNCTIONAL TESTS:  ?-Squat - Rt weight shift ?-Single leg stance: Lt WNL, Rt WNL but pelvic tightness - pt states that if she was in pain flare up that would bother her ?-Lt single leg stance pulling Rt shoe off causes pain in Rt groin ?  ?GAIT: ?WNL ?  ?POSTURE:  ?Rt lumbar scoliosis; reduced spinal curvature throughout ?  ?LUMBARAROM/PROM ?Flexion 100%, extension 25%, bil side bend 100%, Rt anterior hip pinching with Lt rotation ?  ?  ?LE ROM: ?            Pain with Rt hip IR ?            Scour (-) Rt ?  ?  ?LE MMT: ?            Bil hip flexion 4/5, Bil hip IR/ER 4/5, Bil hip extension/abduction/adduction 3+/5 ?  ?PELVIC MMT: not assessed today ?  ?  ?      PALPATION: ?  General  Tenderness to palpation in Rt lower abdomen/hip flexors, adductors, obturator internal/iscial tuberosity, Rt gluteals/hip rotators with sig trigger points ?  ?              External Perineal Exam NA ?              ?              Internal Pelvic Floor NA ?  ?TONE: ?NA ?  ?PROLAPSE: ?NA ?  ?TODAY'S TREATMENT 07/09/21:  ?Manual: ?Soft tissue mobilization: ?Scar tissue mobilization: ?Myofascial release: ?Spinal mobilization: ?Internal pelvic floor techniques: ?No emotional/communication barriers or cognitive limitation. Patient is motivated to learn. Patient understands  and agrees with treatment goals and plan. PT explains patient will be examined in standing, sitting, and lying down to see how their muscles and joints work. When they are ready, they will be asked to remove their underwear so PT can examine their perineum. The  patient is also given the option of providing their own chaperone as one is not provided in our facility. The patient also has the right and is explained the right to defer or refuse any part of the evaluation or treatment including the internal exam. With the patient's consent, PT will use one gloved finger to gently assess the muscles of the pelvic floor, seeing how well it contracts and relaxes and if there is muscle symmetry. After, the patient will get dressed and PT and patient will discuss exam findings and plan of care. PT and patient discuss plan of care, schedule, attendance policy and HEP activities. ?Internal vaginal pelvic exam ?Rt internal pelvic floor/OI release ?Dry needling: ?Neuromuscular re-education: ?Core retraining:  ?Core facilitation: ?Form correction: ?Pelvic floor contraction training: ?Down training: ?Exercises: ?Stretches/mobility: ?Piriformis stretch propped 2 min ?Butterfly stretch min ?Standing adductor stretches ?Strengthening: ?Clam shells 2 x 10 ?Therapeutic activities: ?Functional strengthening activities: ?Self-care: ? ? ? ?TREATMENT 07/02/21 ?EVAL  ?Manual: ?Soft tissue mobilization: ?Rt obturator internus ?Scar tissue mobilization: ?Myofascial release: ?Spinal mobilization: ?Internal pelvic floor techniques: ?Dry needling: ?Rt gluteals/hip rotators ?Neuromuscular re-education: ?Core retraining:  ?Core facilitation: ?Form correction: ?Pelvic floor contraction training: ?Down training: ?Exercises: ?Stretches/mobility: ?Standing forward fold with bil hip IR 10 breaths ?Rt bent knee fall in with hip hiking 3 x 10 ?Strengthening: ?Therapeutic activities: ?Functional strengthening activities: ?Self-care: ?  ?  ?  ?  ?PATIENT  EDUCATION:  ?Education details: HEP progressions;  ?Person educated: Patient ?Education method: Explanation, Demonstration, Tactile cues, Verbal cues, and Handouts ?Education comprehension: verbalized understanding ?  ?

## 2021-07-15 ENCOUNTER — Ambulatory Visit: Payer: BC Managed Care – PPO | Attending: Obstetrics and Gynecology

## 2021-07-15 DIAGNOSIS — R293 Abnormal posture: Secondary | ICD-10-CM | POA: Diagnosis present

## 2021-07-15 DIAGNOSIS — M62838 Other muscle spasm: Secondary | ICD-10-CM | POA: Diagnosis present

## 2021-07-15 DIAGNOSIS — M6281 Muscle weakness (generalized): Secondary | ICD-10-CM | POA: Diagnosis present

## 2021-07-15 NOTE — Therapy (Signed)
?OUTPATIENT PHYSICAL THERAPY TREATMENT NOTE ? ? ?Patient Name: Michele Bennett ?MRN: [id number] ?DOB:1960-04-28, 61 y.o., female ?Today's Date: 07/15/2021 ? ?PCP: Billey Gosling, MD ?REFERRING PROVIDER: Nunzio Cobbs, MD ? ?END OF SESSION:  ? PT End of Session - 07/15/21 0930   ? ? Visit Number 3   ? Date for PT Re-Evaluation 09/24/21   ? Authorization Type BCBS   ? PT Start Time 0930   ? PT Stop Time 1010   ? PT Time Calculation (min) 40 min   ? Activity Tolerance Patient tolerated treatment well   ? Behavior During Therapy [facility] for tasks assessed/performed   ? ?  ?  ? ?  ? ? ?Past Medical History:  ?Diagnosis Date  ? Allergy   ? Arthritis   ? Cancer [facility] 2019  ? basal cell on chest and back  ? Cyst   ? hip; probable epidemoid inclusion cyst; resolution with antibiotics  ? Right knee meniscal tear  07/2011  ? Dr Hal Morales, Ortho; S/P cortisone injection  ? Spinal stenosis at L4-L5 level 2017  ? bilateral  ? Squamous cell carcinoma of leg, left 2022  ? Tear of left acetabular labrum 2006 ?  ? Dr Sallyanne Havers  ? Thyroid nodule   ? dx by Dr. Maxie Better  ? Vitamin D deficiency   ? ?Past Surgical History:  ?Procedure Laterality Date  ? [place]  ? Post fracture   ? DILATION AND CURETTAGE OF UTERUS  1988  ? FOOT SURGERY    ? left- bone spurs, Dr Mayer Camel  ? HAND SURGERY    ? Thumb surgery x 2  ? KNEE ARTHROPLASTY    ? KNEE ARTHROSCOPY Right 2013  ? LAPAROTOMY  1988  ? For Dysmenorrhea  ? REPLACEMENT TOTAL KNEE Right 11/2018  ? REPLACEMENT TOTAL KNEE Left 04/27/2021  ? ?Patient Active Problem List  ? Diagnosis Date Noted  ? Right flank pain 01/21/2021  ? Tingling of both feet 01/05/2021  ? Multiple thyroid nodules 07/10/2020  ? History of SCC (squamous cell carcinoma) of skin 05/16/2020  ? Thyroid nodule-benign-no follow-up needed 12/12/2019  ? Spondylosis without myelopathy or radiculopathy, lumbar region 03/14/2019  ? Chest tightness 10/05/2018  ? Primary osteoarthritis of both knees 12/09/2016  ? Plantar  fasciitis 12/09/2016  ? Primary osteoarthritis of both feet 12/09/2016  ? Primary osteoarthritis of both hands 12/09/2016  ? DDD (degenerative disc disease), lumbar 12/09/2016  ? Lateral epicondylitis, right elbow 12/09/2016  ? Lumbar radiculopathy 02/25/2016  ? DDD (degenerative disc disease), cervical 10/19/2012  ? PVCs (premature ventricular contractions) 03/12/2011  ? Infected sebaceous cyst of skin 10/14/2010  ? Vitamin D deficiency 01/01/2008  ? DJD (degenerative joint disease), multiple sites 01/01/2008  ? ? ?REFERRING DIAG: N39.3 (ICD-10-CM) - Stress incontinence ?R10.31 (ICD-10-CM) - Right inguinal pain ? ?THERAPY DIAG:  ?Abnormal posture ? ?Muscle weakness (generalized) ? ?Other muscle spasm ? ?PERTINENT HISTORY: Bil TKA ? ?PRECAUTIONS: NA ? ?SUBJECTIVE: Pt states that she was a lot more sore over the last week since last session. She noticed that she sat a lot over the weekend which she felt like aggravated the pain.  ? ?SUBJECTIVE STATEMENT 07/02/21: ?Pt started having Rt sided groin/pelvic pain that was severe last summer, but is now more intermittent than it was initially. She feels like she would have this pain intermittently prior to Rt knee replacement as well. She had Rt knee replacement in 11/2018 that was very difficult and was accompanied by Rt  sciatica; she had Lt total knee replacement 8 weeks ago and states that it went much better and she was just released this morning.  ?Fluid intake: Yes: -   ?  ?Patient confirms identification and approves PT to assess pelvic floor and treatment Yes ?  ?  ?PRECAUTIONS: None ?  ?  ?PATIENT GOALS decrease Rt pelvic pain ?  ?PERTINENT HISTORY:  ?Bil TKA ?Sexual abuse: No ?  ?BOWEL MOVEMENT ?Pain with bowel movement: No ?Type of bowel movement:Frequency every other day and Strain No ?Fully empty rectum: No ?Leakage: No ?Pads: No ?Fiber supplement: No ?  ?URINATION ?Pain with urination: No ?Fully empty bladder: Yes: - ?Stream:  normal ?Urgency: No ?Frequency:  WNL ?Leakage: Coughing, Sneezing, and Laughing ?Pads: No ?  ?INTERCOURSE ?Pain with intercourse: Initial Penetration ?Ability to have vaginal penetration:  Yes: not comfortably and no longer trying ?[place]: yes ?Marinoff Scale: 0/3 ?  ?PREGNANCY ?Vaginal deliveries 0 ?C-section deliveries 0  ?  ?  ?OBJECTIVE 07/09/21: Notable Rt obturator internus restriction and tenderness palpated with internal vaginal assessment  ?  ?  ?OBJECTIVE 07/02/21: ? ?COGNITION: ?           Overall cognitive status: Within functional limits for tasks assessed              ?            ?SENSATION: ?           Light touch: Appears intact ?           Proprioception: Appears intact ?  ?  ?FUNCTIONAL TESTS:  ?-Squat - Rt weight shift ?-Single leg stance: Lt WNL, Rt WNL but pelvic tightness - pt states that if she was in pain flare up that would bother her ?-Lt single leg stance pulling Rt shoe off causes pain in Rt groin ?  ?GAIT: ?WNL ?  ?POSTURE:  ?Rt lumbar scoliosis; reduced spinal curvature throughout ?  ?LUMBARAROM/PROM ?Flexion 100%, extension 25%, bil side bend 100%, Rt anterior hip pinching with Lt rotation ?  ?  ?LE ROM: ?            Pain with Rt hip IR ?            Scour (-) Rt ?  ?  ?LE MMT: ?            Bil hip flexion 4/5, Bil hip IR/ER 4/5, Bil hip extension/abduction/adduction 3+/5 ?  ?PELVIC MMT: not assessed today ?  ?  ?      PALPATION: ?  General  Tenderness to palpation in Rt lower abdomen/hip flexors, adductors, obturator internal/iscial tuberosity, Rt gluteals/hip rotators with sig trigger points ?  ?              External Perineal Exam NA ?              ?              Internal Pelvic Floor NA ?  ?TONE: ?NA ?  ?PROLAPSE: ?NA ?  ?TODAY'S TREATMENT 07/15/21: ?Manual: ?Soft tissue mobilization: ?Scar tissue mobilization: ?Myofascial release: ?Instrument assisted to Rt adductors/hamstrings and ischial borders ?Spinal mobilization: ?Internal pelvic floor techniques: ?No emotional/communication barriers or cognitive limitation.  Patient is motivated to learn. Patient understands and agrees with treatment goals and plan. PT explains patient will be examined in standing, sitting, and lying down to see how their muscles and joints work. When they are ready, they will be asked to remove their underwear  so PT can examine their perineum. The patient is also given the option of providing their own chaperone as one is not provided in our facility. The patient also has the right and is explained the right to defer or refuse any part of the evaluation or treatment including the internal exam. With the patient's consent, PT will use one gloved finger to gently assess the muscles of the pelvic floor, seeing how well it contracts and relaxes and if there is muscle symmetry. After, the patient will get dressed and PT and patient will discuss exam findings and plan of care. PT and patient discuss plan of care, schedule, attendance policy and HEP activities. ?Rt obturator/levator ani release ?Dry needling: ?Neuromuscular re-education: ?Core retraining:  ?Core facilitation: ?Form correction: ?Pelvic floor contraction training: ?Down training: ?Exercises: ?Stretches/mobility: ?Standing adductor stretches 2 min Rt ?Strengthening: ?Sidelying adductor leg lift 2 x 10 Rt ?Adductor isometric 10 x 5 seconds ?Sidelying abduction with ER 2 x 10 Rt ?[place] deadlift 10x ?Therapeutic activities: ?Functional strengthening activities: ?Self-care: ? ? ? ?TREATMENT 07/09/21:  ?Manual: ?Soft tissue mobilization: ?Scar tissue mobilization: ?Myofascial release: ?Spinal mobilization: ?Internal pelvic floor techniques: ?No emotional/communication barriers or cognitive limitation. Patient is motivated to learn. Patient understands and agrees with treatment goals and plan. PT explains patient will be examined in standing, sitting, and lying down to see how their muscles and joints work. When they are ready, they will be asked to remove their underwear so PT can examine their  perineum. The patient is also given the option of providing their own chaperone as one is not provided in our facility. The patient also has the right and is explained the right to defer or refuse any part of the

## 2021-07-20 NOTE — Progress Notes (Signed)
? ? ?Subjective:  ? ? Patient ID: Michele Bennett, female    DOB: 1960-08-28, 61 y.o.   MRN: [id number] ? ? ? ? ? ?HPI ?Verbena is here for  ?Chief Complaint  ?Patient presents with  ? Cyst  ?  Right sided cyst   ? Insomnia  ?  Patient request to   ? ? ? ?Cyst on side -the cyst on her right side that was infected not long ago has started to itch and hurt a little.  She is not sure if is just irritation from rubbing on her close.  She denies any fever.  She was not sure what to do about it. ? ?Sleep difficulties -she has had sleep issues for a while-maybe a year.  She has no difficulty getting to sleep, but is not able to get asleep.  She wakes up throughout the night for several reasons and has difficulty getting back to sleep.  She has tried melatonin, which caused bad dreams.  She has tried NyQuil, which does work well.  She has tried Benadryl which she gets her to sleep but does not keep her asleep.  She has had gabapentin at night and that has helped.  Recently she tried Ambien CR after surgery, but that did not help. ? ?She feels like she needs something to help her sleep.  She has decreased her caffeine intake.  She has consistent bedtime and wake times. ? ? ?Medications and allergies reviewed with patient and updated if appropriate. ? ?Current Outpatient Medications on File Prior to Visit  ?Medication Sig Dispense Refill  ? acetaminophen (TYLENOL) 500 MG tablet Take by mouth as needed.     ? Cholecalciferol (VITAMIN D3) 2000 UNITS TABS Take 5,000 Units by mouth daily.    ? diclofenac sodium (VOLTAREN) 1 % GEL Apply 4 g topically 4 (four) times daily. (Patient taking differently: Apply 4 g topically as needed.) 100 g 3  ? estradiol (DOTTI) 0.075 MG/24HR PLACE ONE PATCH ONTO THE SKIN TWO TIMES A WEEK 24 patch 3  ? fexofenadine (ALLEGRA) 180 MG tablet Take 180 mg by mouth as needed.    ? magnesium oxide (MAG-OX) 400 MG tablet Take 400 mg by mouth daily.    ? meloxicam (MOBIC) 15 MG tablet Take 15 mg by  mouth daily.    ? Misc Natural Products (TART CHERRY ADVANCED PO) Take by mouth.    ? progesterone (PROMETRIUM) 100 MG capsule Take 1 capsule (100 mg total) by mouth at bedtime. 90 capsule 3  ? triamcinolone (KENALOG) 0.1 % paste Use as directed 1 application in the mouth or throat 2 (two) times daily. 5 g 12  ? TURMERIC PO Take by mouth daily.    ? Zinc 25 MG TABS Take 1 tablet by mouth daily. '20MG'$     ? zolpidem (AMBIEN CR) 6.25 MG CR tablet Take 6.25 mg by mouth at bedtime as needed.    ? ?No current facility-administered medications on file prior to visit.  ? ? ?Review of Systems ? ?   ?Objective:  ? ?Vitals:  ? 07/21/21 1453  ?BP: 118/80  ?Pulse: 81  ?Temp: 98.6 ?F (37 ?C)  ?SpO2: 99%  ? ?BP Readings from Last 3 Encounters:  ?07/21/21 118/80  ?06/24/21 130/70  ?01/26/21 116/68  ? ?Wt Readings from Last 3 Encounters:  ?07/21/21 154 lb (69.9 kg)  ?06/24/21 155 lb (70.3 kg)  ?01/26/21 157 lb (71.2 kg)  ? ?Body mass index is 24.12 kg/m?. ? ?  ?Physical  Exam ?Constitutional:   ?   Appearance: Normal appearance.  ?HENT:  ?   Head: Normocephalic and atraumatic.  ?Skin: ?   General: Skin is warm and dry.  ?   Findings: Lesion (Right lateral abdomen at waistline there is what looks like a small keloid in an L-shaped pattern with possible cyst under it-no tenderness, no surrounding erythema, no fluctuance, slightly tender with palpating with pressure) present.  ?Neurological:  ?   Mental Status: She is alert.  ? ?   ? ? ? ? ? ?Assessment & Plan:  ? ? ?See Problem List for Assessment and Plan of chronic medical problems.  ? ? ? ? ?

## 2021-07-21 ENCOUNTER — Ambulatory Visit (INDEPENDENT_AMBULATORY_CARE_PROVIDER_SITE_OTHER): Payer: BC Managed Care – PPO | Admitting: Internal Medicine

## 2021-07-21 ENCOUNTER — Encounter: Payer: Self-pay | Admitting: Internal Medicine

## 2021-07-21 DIAGNOSIS — G479 Sleep disorder, unspecified: Secondary | ICD-10-CM | POA: Diagnosis not present

## 2021-07-21 DIAGNOSIS — L723 Sebaceous cyst: Secondary | ICD-10-CM | POA: Diagnosis not present

## 2021-07-21 MED ORDER — TRAZODONE HCL 50 MG PO TABS
50.0000 mg | ORAL_TABLET | Freq: Every day | ORAL | 1 refills | Status: DC
Start: 2021-07-21 — End: 2022-02-25

## 2021-07-21 NOTE — Assessment & Plan Note (Signed)
Chronic ?Going on for about a year or so ?Has no difficulty getting to sleep, but has difficulty staying asleep ?Has tried several things over-the-counter including melatonin, Benadryl, NyQuil.  Has also tried Ambien CR, which not effective ?Has decreased caffeine intake ?Discussed sleep hygiene ?Discussed options-safest option is trazodone-we will try this first and see if it works-start 50 mg nightly or as needed ?If its not effective can consider Lunesta ?

## 2021-07-21 NOTE — Patient Instructions (Addendum)
? ? ? ? ?Medications changes include :   trazodone 50 mg at night as needed for sleep ? ? ?Your prescription(s) have been sent to your pharmacy.  ? ? ?Insomnia ?Insomnia is a sleep disorder that makes it difficult to fall asleep or stay asleep. Insomnia can cause fatigue, low energy, difficulty concentrating, mood swings, and poor performance at work or school. ?There are three different ways to classify insomnia: ?Difficulty falling asleep. ?Difficulty staying asleep. ?Waking up too early in the morning. ?Any type of insomnia can be long-term (chronic) or short-term (acute). Both are common. Short-term insomnia usually lasts for 3 months or less. Chronic insomnia occurs at least three times a week for longer than 3 months. ?What are the causes? ?Insomnia may be caused by another condition, situation, or substance, such as: ?Having certain mental health conditions, such as anxiety and depression. ?Using caffeine, alcohol, tobacco, or drugs. ?Having gastrointestinal conditions, such as gastroesophageal reflux disease (GERD). ?Having certain medical conditions. These include: ?Asthma. ?Alzheimer's disease. ?Stroke. ?Chronic pain. ?An overactive thyroid gland (hyperthyroidism). ?Other sleep disorders, such as restless legs syndrome and sleep apnea. ?Menopause. ?Sometimes, the cause of insomnia may not be known. ?What increases the risk? ?Risk factors for insomnia include: ?Gender. Females are affected more often than males. ?Age. Insomnia is more common as people get older. ?Stress and certain medical and mental health conditions. ?Lack of exercise. ?Having an irregular work schedule. This may include working night shifts and traveling between different time zones. ?What are the signs or symptoms? ?If you have insomnia, the main symptom is having trouble falling asleep or having trouble staying asleep. This may lead to other symptoms, such as: ?Feeling tired or having low energy. ?Feeling nervous about going to  sleep. ?Not feeling rested in the morning. ?Having trouble concentrating. ?Feeling irritable, anxious, or depressed. ?How is this diagnosed? ?This condition may be diagnosed based on: ?Your symptoms and medical history. Your health care provider may ask about: ?Your sleep habits. ?Any medical conditions you have. ?Your mental health. ?A physical exam. ?How is this treated? ?Treatment for insomnia depends on the cause. Treatment may focus on treating an underlying condition that is causing the insomnia. Treatment may also include: ?Medicines to help you sleep. ?Counseling or therapy. ?Lifestyle adjustments to help you sleep better. ?Follow these instructions at home: ?Eating and drinking ? ?Limit or avoid alcohol, caffeinated beverages, and products that contain nicotine and tobacco, especially close to bedtime. These can disrupt your sleep. ?Do not eat a large meal or eat spicy foods right before bedtime. This can lead to digestive discomfort that can make it hard for you to sleep. ?Sleep habits ? ?Keep a sleep diary to help you and your health care provider figure out what could be causing your insomnia. Write down: ?When you sleep. ?When you wake up during the night. ?How well you sleep and how rested you feel the next day. ?Any side effects of medicines you are taking. ?What you eat and drink. ?Make your bedroom a dark, comfortable place where it is easy to fall asleep. ?Put up shades or blackout curtains to block light from outside. ?Use a white noise machine to block noise. ?Keep the temperature cool. ?Limit screen use before bedtime. This includes: ?Not watching TV. ?Not using your smartphone, tablet, or computer. ?Stick to a routine that includes going to bed and waking up at the same times every day and night. This can help you fall asleep faster. Consider making a quiet activity, such  as reading, part of your nighttime routine. ?Try to avoid taking naps during the day so that you sleep better at night. ?Get  out of bed if you are still awake after 15 minutes of trying to sleep. Keep the lights down, but try reading or doing a quiet activity. When you feel sleepy, go back to bed. ?General instructions ?Take over-the-counter and prescription medicines only as told by your health care provider. ?Exercise regularly as told by your health care provider. However, avoid exercising in the hours right before bedtime. ?Use relaxation techniques to manage stress. Ask your health care provider to suggest some techniques that may work well for you. These may include: ?Breathing exercises. ?Routines to release muscle tension. ?Visualizing peaceful scenes. ?Make sure that you drive carefully. Do not drive if you feel very sleepy. ?Keep all follow-up visits. This is important. ?Contact a health care provider if: ?You are tired throughout the day. ?You have trouble in your daily routine due to sleepiness. ?You continue to have sleep problems, or your sleep problems get worse. ?Get help right away if: ?You have thoughts about hurting yourself or someone else. ?Get help right away if you feel like you may hurt yourself or others, or have thoughts about taking your own life. Go to your nearest emergency room or: ?Call 911. ?Call the [facility] at [phone] or [phone]. This is open 24 hours a day. ?Text the Crisis Text Line at (626)225-1496. ?Summary ?Insomnia is a sleep disorder that makes it difficult to fall asleep or stay asleep. ?Insomnia can be long-term (chronic) or short-term (acute). ?Treatment for insomnia depends on the cause. Treatment may focus on treating an underlying condition that is causing the insomnia. ?Keep a sleep diary to help you and your health care provider figure out what could be causing your insomnia. ?This information is not intended to replace advice given to you by your health care provider. Make sure you discuss any questions you have with your health care provider. ?Document Revised:  02/09/2021 Document Reviewed: 02/09/2021 ?Elsevier Patient Education ? [place]. ? ?

## 2021-07-21 NOTE — Assessment & Plan Note (Signed)
Chronic ?Right side of abdomen ?Infected once fall 2022 ?No evidence of active infection, but it may be starting to get slightly bigger and is irritated by her clothing ?Has an upcoming appointment with dermatology and will discuss with Dr. Delman Cheadle about possibly removing this ?

## 2021-07-22 ENCOUNTER — Ambulatory Visit: Payer: BC Managed Care – PPO

## 2021-07-22 DIAGNOSIS — M6281 Muscle weakness (generalized): Secondary | ICD-10-CM

## 2021-07-22 DIAGNOSIS — R293 Abnormal posture: Secondary | ICD-10-CM

## 2021-07-22 DIAGNOSIS — M62838 Other muscle spasm: Secondary | ICD-10-CM

## 2021-07-22 NOTE — Therapy (Signed)
?OUTPATIENT PHYSICAL THERAPY TREATMENT NOTE ? ? ?Patient Name: Michele Bennett ?MRN: [id number] ?DOB:1960/10/10, 61 y.o., female ?Today's Date: 07/22/2021 ? ?PCP: Billey Gosling, MD ?REFERRING PROVIDER: Nunzio Cobbs, MD ? ?END OF SESSION:  ? PT End of Session - 07/22/21 0847   ? ? Visit Number 4   ? Date for PT Re-Evaluation 09/24/21   ? Authorization Type BCBS   ? PT Start Time 0845   ? PT Stop Time 0925   ? PT Time Calculation (min) 40 min   ? Activity Tolerance Patient tolerated treatment well   ? Behavior During Therapy [facility] for tasks assessed/performed   ? ?  ?  ? ?  ? ? ?Past Medical History:  ?Diagnosis Date  ? Allergy   ? Arthritis   ? Cancer [facility]) 2019  ? basal cell on chest and back  ? Cyst   ? hip; probable epidemoid inclusion cyst; resolution with antibiotics  ? Right knee meniscal tear  07/2011  ? Dr Hal Morales, Ortho; S/P cortisone injection  ? Spinal stenosis at L4-L5 level 2017  ? bilateral  ? Squamous cell carcinoma of leg, left 2022  ? Tear of left acetabular labrum 2006 ?  ? Dr Sallyanne Havers  ? Thyroid nodule   ? dx by Dr. Maxie Better  ? Vitamin D deficiency   ? ?Past Surgical History:  ?Procedure Laterality Date  ? [place]  ? Post fracture   ? DILATION AND CURETTAGE OF UTERUS  1988  ? FOOT SURGERY    ? left- bone spurs, Dr Mayer Camel  ? HAND SURGERY    ? Thumb surgery x 2  ? KNEE ARTHROPLASTY    ? KNEE ARTHROSCOPY Right 2013  ? LAPAROTOMY  1988  ? For Dysmenorrhea  ? REPLACEMENT TOTAL KNEE Right 11/2018  ? REPLACEMENT TOTAL KNEE Left 04/27/2021  ? ?Patient Active Problem List  ? Diagnosis Date Noted  ? Sleep difficulties 07/21/2021  ? Right flank pain 01/21/2021  ? Tingling of both feet 01/05/2021  ? Multiple thyroid nodules 07/10/2020  ? History of SCC (squamous cell carcinoma) of skin 05/16/2020  ? Thyroid nodule-benign-no follow-up needed 12/12/2019  ? Spondylosis without myelopathy or radiculopathy, lumbar region 03/14/2019  ? Chest tightness 10/05/2018  ? Primary osteoarthritis of both  knees 12/09/2016  ? Plantar fasciitis 12/09/2016  ? Primary osteoarthritis of both feet 12/09/2016  ? Primary osteoarthritis of both hands 12/09/2016  ? DDD (degenerative disc disease), lumbar 12/09/2016  ? Lateral epicondylitis, right elbow 12/09/2016  ? Lumbar radiculopathy 02/25/2016  ? DDD (degenerative disc disease), cervical 10/19/2012  ? PVCs (premature ventricular contractions) 03/12/2011  ? Sebaceous cyst 10/14/2010  ? Vitamin D deficiency 01/01/2008  ? DJD (degenerative joint disease), multiple sites 01/01/2008  ? ? ?REFERRING DIAG: N39.3 (ICD-10-CM) - Stress incontinence ?R10.31 (ICD-10-CM) - Right inguinal pain ? ?THERAPY DIAG:  ?Abnormal posture ? ?Muscle weakness (generalized) ? ?Other muscle spasm ? ?PERTINENT HISTORY: Bil TKA ? ?PRECAUTIONS: NA ? ?SUBJECTIVE: Pt states that she is feeling good and feels like there has been progress. She is currently not in any pain but did feel some briefly last night when sitting.  ? ?SUBJECTIVE STATEMENT 07/02/21: ?Pt started having Rt sided groin/pelvic pain that was severe last summer, but is now more intermittent than it was initially. She feels like she would have this pain intermittently prior to Rt knee replacement as well. She had Rt knee replacement in 11/2018 that was very difficult and was accompanied by Rt sciatica;  she had Lt total knee replacement 8 weeks ago and states that it went much better and she was just released this morning.  ?Fluid intake: Yes: -   ?  ?Patient confirms identification and approves PT to assess pelvic floor and treatment Yes ?  ?  ?PRECAUTIONS: None ?  ?  ?PATIENT GOALS decrease Rt pelvic pain ?  ?PERTINENT HISTORY:  ?Bil TKA ?Sexual abuse: No ?  ?BOWEL MOVEMENT ?Pain with bowel movement: No ?Type of bowel movement:Frequency every other day and Strain No ?Fully empty rectum: No ?Leakage: No ?Pads: No ?Fiber supplement: No ?  ?URINATION ?Pain with urination: No ?Fully empty bladder: Yes: - ?Stream:  normal ?Urgency: No ?Frequency:  WNL ?Leakage: Coughing, Sneezing, and Laughing ?Pads: No ?  ?INTERCOURSE ?Pain with intercourse: Initial Penetration ?Ability to have vaginal penetration:  Yes: not comfortably and no longer trying ?Climax: yes ?Marinoff Scale: 0/3 ?  ?PREGNANCY ?Vaginal deliveries 0 ?C-section deliveries 0  ?  ?  ?OBJECTIVE 07/09/21: Notable Rt obturator internus restriction and tenderness palpated with internal vaginal assessment  ?  ?  ?OBJECTIVE 07/02/21: ? ?COGNITION: ?           Overall cognitive status: Within functional limits for tasks assessed              ?            ?SENSATION: ?           Light touch: Appears intact ?           Proprioception: Appears intact ?  ?  ?FUNCTIONAL TESTS:  ?-Squat - Rt weight shift ?-Single leg stance: Lt WNL, Rt WNL but pelvic tightness - pt states that if she was in pain flare up that would bother her ?-Lt single leg stance pulling Rt shoe off causes pain in Rt groin ?  ?GAIT: ?WNL ?  ?POSTURE:  ?Rt lumbar scoliosis; reduced spinal curvature throughout ?  ?LUMBARAROM/PROM ?Flexion 100%, extension 25%, bil side bend 100%, Rt anterior hip pinching with Lt rotation ?  ?  ?LE ROM: ?            Pain with Rt hip IR ?            Scour (-) Rt ?  ?  ?LE MMT: ?            Bil hip flexion 4/5, Bil hip IR/ER 4/5, Bil hip extension/abduction/adduction 3+/5 ?  ?PELVIC MMT: not assessed today ?  ?  ?      PALPATION: ?  General  Tenderness to palpation in Rt lower abdomen/hip flexors, adductors, obturator internal/iscial tuberosity, Rt gluteals/hip rotators with sig trigger points ?  ?              External Perineal Exam NA ?              ?              Internal Pelvic Floor NA ?  ?TONE: ?NA ?  ?PROLAPSE: ?NA ?  ?TODAY'S TREATMENT 07/22/21: ?Manual: ?Soft tissue mobilization: ?Scar tissue mobilization: ?Myofascial release: ?Instrument assisted to Rt adductors/hamstrings and ischial borders ?Obturator internus external release ?Spinal mobilization: ?Internal pelvic floor techniques: ?No  emotional/communication barriers or cognitive limitation. Patient is motivated to learn. Patient understands and agrees with treatment goals and plan. PT explains patient will be examined in standing, sitting, and lying down to see how their muscles and joints work. When they are ready, they will be asked to  remove their underwear so PT can examine their perineum. The patient is also given the option of providing their own chaperone as one is not provided in our facility. The patient also has the right and is explained the right to defer or refuse any part of the evaluation or treatment including the internal exam. With the patient's consent, PT will use one gloved finger to gently assess the muscles of the pelvic floor, seeing how well it contracts and relaxes and if there is muscle symmetry. After, the patient will get dressed and PT and patient will discuss exam findings and plan of care. PT and patient discuss plan of care, schedule, attendance policy and HEP activities. ? ?Dry needling: ?Neuromuscular re-education: ?Core retraining:  ?Core facilitation: ?Form correction: ?Pelvic floor contraction training: ?Down training: ?Exercises: ?Stretches/mobility: ?Strengthening: ?Therapeutic activities: ?Functional strengthening activities: ?Self-care: ?Pelvic floor muscle wand and use ? ? ?TREATMENT 07/15/21: ?Manual: ?Soft tissue mobilization: ?Scar tissue mobilization: ?Myofascial release: ?Instrument assisted to Rt adductors/hamstrings and ischial borders ?Spinal mobilization: ?Internal pelvic floor techniques: ?No emotional/communication barriers or cognitive limitation. Patient is motivated to learn. Patient understands and agrees with treatment goals and plan. PT explains patient will be examined in standing, sitting, and lying down to see how their muscles and joints work. When they are ready, they will be asked to remove their underwear so PT can examine their perineum. The patient is also given the option of  providing their own chaperone as one is not provided in our facility. The patient also has the right and is explained the right to defer or refuse any part of the evaluation or treatment including the internal exam. With the

## 2021-07-29 ENCOUNTER — Ambulatory Visit: Payer: BC Managed Care – PPO

## 2021-07-29 DIAGNOSIS — M6281 Muscle weakness (generalized): Secondary | ICD-10-CM

## 2021-07-29 DIAGNOSIS — R293 Abnormal posture: Secondary | ICD-10-CM | POA: Diagnosis not present

## 2021-07-29 DIAGNOSIS — M62838 Other muscle spasm: Secondary | ICD-10-CM

## 2021-07-29 NOTE — Therapy (Signed)
?OUTPATIENT PHYSICAL THERAPY TREATMENT NOTE ? ? ?Patient Name: Michele Bennett ?MRN: [id number] ?DOB:25-Aug-1960, 61 y.o., female ?Today's Date: 07/29/2021 ? ?PCP: Billey Gosling, MD ?REFERRING PROVIDER: Nunzio Cobbs, MD ? ?END OF SESSION:  ? PT End of Session - 07/29/21 0848   ? ? Visit Number 5   ? Date for PT Re-Evaluation 09/24/21   ? Authorization Type BCBS   ? PT Start Time 0848   ? PT Stop Time 0926   ? PT Time Calculation (min) 38 min   ? Activity Tolerance Patient tolerated treatment well   ? Behavior During Therapy [facility] for tasks assessed/performed   ? ?  ?  ? ?  ? ? ? ?Past Medical History:  ?Diagnosis Date  ? Allergy   ? Arthritis   ? Cancer [facility]) 2019  ? basal cell on chest and back  ? Cyst   ? hip; probable epidemoid inclusion cyst; resolution with antibiotics  ? Right knee meniscal tear  07/2011  ? Dr Hal Morales, Ortho; S/P cortisone injection  ? Spinal stenosis at L4-L5 level 2017  ? bilateral  ? Squamous cell carcinoma of leg, left 2022  ? Tear of left acetabular labrum 2006 ?  ? Dr Sallyanne Havers  ? Thyroid nodule   ? dx by Dr. Maxie Better  ? Vitamin D deficiency   ? ?Past Surgical History:  ?Procedure Laterality Date  ? [place]  ? Post fracture   ? DILATION AND CURETTAGE OF UTERUS  1988  ? FOOT SURGERY    ? left- bone spurs, Dr Mayer Camel  ? HAND SURGERY    ? Thumb surgery x 2  ? KNEE ARTHROPLASTY    ? KNEE ARTHROSCOPY Right 2013  ? LAPAROTOMY  1988  ? For Dysmenorrhea  ? REPLACEMENT TOTAL KNEE Right 11/2018  ? REPLACEMENT TOTAL KNEE Left 04/27/2021  ? ?Patient Active Problem List  ? Diagnosis Date Noted  ? Sleep difficulties 07/21/2021  ? Right flank pain 01/21/2021  ? Tingling of both feet 01/05/2021  ? Multiple thyroid nodules 07/10/2020  ? History of SCC (squamous cell carcinoma) of skin 05/16/2020  ? Thyroid nodule-benign-no follow-up needed 12/12/2019  ? Spondylosis without myelopathy or radiculopathy, lumbar region 03/14/2019  ? Chest tightness 10/05/2018  ? Primary osteoarthritis of  both knees 12/09/2016  ? Plantar fasciitis 12/09/2016  ? Primary osteoarthritis of both feet 12/09/2016  ? Primary osteoarthritis of both hands 12/09/2016  ? DDD (degenerative disc disease), lumbar 12/09/2016  ? Lateral epicondylitis, right elbow 12/09/2016  ? Lumbar radiculopathy 02/25/2016  ? DDD (degenerative disc disease), cervical 10/19/2012  ? PVCs (premature ventricular contractions) 03/12/2011  ? Sebaceous cyst 10/14/2010  ? Vitamin D deficiency 01/01/2008  ? DJD (degenerative joint disease), multiple sites 01/01/2008  ? ? ?REFERRING DIAG: N39.3 (ICD-10-CM) - Stress incontinence ?R10.31 (ICD-10-CM) - Right inguinal pain ? ?THERAPY DIAG:  ?Abnormal posture ? ?Muscle weakness (generalized) ? ?Other muscle spasm ? ?PERTINENT HISTORY: Bil TKA ? ?PRECAUTIONS: NA ? ?SUBJECTIVE: Patient states that she has had much less pain over the last week. She has brought her own massage tool to see if it is helpful.  ? ?SUBJECTIVE STATEMENT 07/02/21: ?Pt started having Rt sided groin/pelvic pain that was severe last summer, but is now more intermittent than it was initially. She feels like she would have this pain intermittently prior to Rt knee replacement as well. She had Rt knee replacement in 11/2018 that was very difficult and was accompanied by Rt sciatica; she had Lt  total knee replacement 8 weeks ago and states that it went much better and she was just released this morning.  ?Fluid intake: Yes: -   ?  ?Patient confirms identification and approves PT to assess pelvic floor and treatment Yes ?  ?  ?PRECAUTIONS: None ?  ?  ?PATIENT GOALS decrease Rt pelvic pain ?  ?PERTINENT HISTORY:  ?Bil TKA ?Sexual abuse: No ?  ?BOWEL MOVEMENT ?Pain with bowel movement: No ?Type of bowel movement:Frequency every other day and Strain No ?Fully empty rectum: No ?Leakage: No ?Pads: No ?Fiber supplement: No ?  ?URINATION ?Pain with urination: No ?Fully empty bladder: Yes: - ?Stream:  normal ?Urgency: No ?Frequency: WNL ?Leakage: Coughing,  Sneezing, and Laughing ?Pads: No ?  ?INTERCOURSE ?Pain with intercourse: Initial Penetration ?Ability to have vaginal penetration:  Yes: not comfortably and no longer trying ?Climax: yes ?Marinoff Scale: 0/3 ?  ?PREGNANCY ?Vaginal deliveries 0 ?C-section deliveries 0  ?  ?  ?OBJECTIVE 07/09/21: Notable Rt obturator internus restriction and tenderness palpated with internal vaginal assessment  ?  ?  ?OBJECTIVE 07/02/21: ? ?COGNITION: ?           Overall cognitive status: Within functional limits for tasks assessed              ?            ?SENSATION: ?           Light touch: Appears intact ?           Proprioception: Appears intact ?  ?  ?FUNCTIONAL TESTS:  ?-Squat - Rt weight shift ?-Single leg stance: Lt WNL, Rt WNL but pelvic tightness - pt states that if she was in pain flare up that would bother her ?-Lt single leg stance pulling Rt shoe off causes pain in Rt groin ?  ?GAIT: ?WNL ?  ?POSTURE:  ?Rt lumbar scoliosis; reduced spinal curvature throughout ?  ?LUMBARAROM/PROM ?Flexion 100%, extension 25%, bil side bend 100%, Rt anterior hip pinching with Lt rotation ?  ?  ?LE ROM: ?            Pain with Rt hip IR ?            Scour (-) Rt ?  ?  ?LE MMT: ?            Bil hip flexion 4/5, Bil hip IR/ER 4/5, Bil hip extension/abduction/adduction 3+/5 ?  ?PELVIC MMT: not assessed today ?  ?  ?      PALPATION: ?  General  Tenderness to palpation in Rt lower abdomen/hip flexors, adductors, obturator internal/iscial tuberosity, Rt gluteals/hip rotators with sig trigger points ?  ?              External Perineal Exam NA ?              ?              Internal Pelvic Floor NA ?  ?TONE: ?NA ?  ?PROLAPSE: ?NA ?  ?TODAY'S TREATMENT 07/29/21: ?Manual: ?Soft tissue mobilization: ?Scar tissue mobilization: ?Myofascial release: ?Instrument assisted to Rt adductors/hamstrings and ischial borders ?Obturator internus external release ?Spinal mobilization: ?Internal pelvic floor techniques: ?No emotional/communication barriers or cognitive  limitation. Patient is motivated to learn. Patient understands and agrees with treatment goals and plan. PT explains patient will be examined in standing, sitting, and lying down to see how their muscles and joints work. When they are ready, they will be asked to remove their underwear  so PT can examine their perineum. The patient is also given the option of providing their own chaperone as one is not provided in our facility. The patient also has the right and is explained the right to defer or refuse any part of the evaluation or treatment including the internal exam. With the patient's consent, PT will use one gloved finger to gently assess the muscles of the pelvic floor, seeing how well it contracts and relaxes and if there is muscle symmetry. After, the patient will get dressed and PT and patient will discuss exam findings and plan of care. PT and patient discuss plan of care, schedule, attendance policy and HEP activities. ? ?Dry needling: ?Neuromuscular re-education: ?Core retraining:  ?Core facilitation: ?Form correction: ?Pelvic floor contraction training: ?Down training: ?Exercises: ?Stretches/mobility: ?Strengthening: ?SL hip adduction 10x  ?Lateral lunges 10x bil ?Therapeutic activities: ?Functional strengthening activities: ?Self-care: ? ? ? ?TREATMENT 07/22/21: ?Manual: ?Soft tissue mobilization: ?Scar tissue mobilization: ?Myofascial release: ?Instrument assisted to Rt adductors/hamstrings and ischial borders ?Obturator internus external release ?Spinal mobilization: ?Internal pelvic floor techniques: ?No emotional/communication barriers or cognitive limitation. Patient is motivated to learn. Patient understands and agrees with treatment goals and plan. PT explains patient will be examined in standing, sitting, and lying down to see how their muscles and joints work. When they are ready, they will be asked to remove their underwear so PT can examine their perineum. The patient is also given the option  of providing their own chaperone as one is not provided in our facility. The patient also has the right and is explained the right to defer or refuse any part of the evaluation or treatment including th

## 2021-08-03 ENCOUNTER — Ambulatory Visit: Payer: BC Managed Care – PPO

## 2021-08-03 DIAGNOSIS — M62838 Other muscle spasm: Secondary | ICD-10-CM

## 2021-08-03 DIAGNOSIS — R293 Abnormal posture: Secondary | ICD-10-CM | POA: Diagnosis not present

## 2021-08-03 DIAGNOSIS — M6281 Muscle weakness (generalized): Secondary | ICD-10-CM

## 2021-08-03 NOTE — Therapy (Signed)
OUTPATIENT PHYSICAL THERAPY TREATMENT NOTE   Patient Name: Michele Bennett MRN: [id number] DOB:[birth year], 61 y.o., female Today's Date: 08/03/2021  PCP: Billey Gosling, MD REFERRING PROVIDER: Nunzio Cobbs, MD  END OF SESSION:   PT End of Session - 08/03/21 0846     Visit Number 6    Date for PT Re-Evaluation 09/24/21    Authorization Type [employer]    PT Start Time 0845    PT Stop Time 0924    PT Time Calculation (min) 39 min    Activity Tolerance Patient tolerated treatment well    Behavior During Therapy [facility] for tasks assessed/performed               Past Medical History:  Diagnosis Date   Allergy    Arthritis    Cancer ([place]) 2019   basal cell on chest and back   Cyst    hip; probable epidemoid inclusion cyst; resolution with antibiotics   Right knee meniscal tear  07/2011   Dr Hal Morales, Ortho; S/P cortisone injection   Spinal stenosis at L4-L5 level 2017   bilateral   Squamous cell carcinoma of leg, left 2022   Tear of left acetabular labrum 2006 ?   Dr Sallyanne Havers   Thyroid nodule    dx by Dr. Maxie Better   Vitamin D deficiency    Past Surgical History:  Procedure Laterality Date   ANKLE SURGERY  1989   Post fracture    DILATION AND CURETTAGE OF UTERUS  1988   FOOT SURGERY     left- bone spurs, Dr Mayer Camel   HAND SURGERY     Thumb surgery x 2   KNEE ARTHROPLASTY     KNEE ARTHROSCOPY Right 2013   LAPAROTOMY  1988   For Dysmenorrhea   REPLACEMENT TOTAL KNEE Right 11/2018   REPLACEMENT TOTAL KNEE Left 04/27/2021   Patient Active Problem List   Diagnosis Date Noted   Sleep difficulties 07/21/2021   Right flank pain 01/21/2021   Tingling of both feet 01/05/2021   Multiple thyroid nodules 07/10/2020   History of SCC (squamous cell carcinoma) of skin 05/16/2020   Thyroid nodule-benign-no follow-up needed 12/12/2019   Spondylosis without myelopathy or radiculopathy, lumbar region 03/14/2019   Chest tightness 10/05/2018   Primary osteoarthritis of  both knees 12/09/2016   Plantar fasciitis 12/09/2016   Primary osteoarthritis of both feet 12/09/2016   Primary osteoarthritis of both hands 12/09/2016   DDD (degenerative disc disease), lumbar 12/09/2016   Lateral epicondylitis, right elbow 12/09/2016   Lumbar radiculopathy 02/25/2016   DDD (degenerative disc disease), cervical 10/19/2012   PVCs (premature ventricular contractions) 03/12/2011   Sebaceous cyst 10/14/2010   Vitamin D deficiency 01/01/2008   DJD (degenerative joint disease), multiple sites 01/01/2008    REFERRING DIAG: N39.3 (ICD-10-CM) - Stress incontinence R10.31 (ICD-10-CM) - Right inguinal pain  THERAPY DIAG:  Abnormal posture  Muscle weakness (generalized)  Other muscle spasm  PERTINENT HISTORY: Bil TKA  PRECAUTIONS: NA  SUBJECTIVE: Pt states that she is doing really well. She has been doing less sitting this week and is having very low pain levels over all. Due to not having many PT visits left for insurance coverage, she would like to discuss stopping PT for right now and reviewing exercises.   SUBJECTIVE STATEMENT 07/02/21: Pt started having Rt sided groin/pelvic pain that was severe last summer, but is now more intermittent than it was initially. She feels like she would have this pain intermittently prior to Rt  knee replacement as well. She had Rt knee replacement in 11/2018 that was very difficult and was accompanied by Rt sciatica; she had Lt total knee replacement 8 weeks ago and states that it went much better and she was just released this morning.  Fluid intake: Yes: -     Patient confirms identification and approves PT to assess pelvic floor and treatment Yes     PRECAUTIONS: None     PATIENT GOALS decrease Rt pelvic pain   PERTINENT HISTORY:  Bil TKA Sexual abuse: No   BOWEL MOVEMENT Pain with bowel movement: No Type of bowel movement:Frequency every other day and Strain No Fully empty rectum: No Leakage: No Pads: No Fiber supplement:  No   URINATION Pain with urination: No Fully empty bladder: Yes: - Stream:  normal Urgency: No Frequency: WNL Leakage: Coughing, Sneezing, and Laughing Pads: No   INTERCOURSE Pain with intercourse: Initial Penetration Ability to have vaginal penetration:  Yes: not comfortably and no longer trying Climax: yes Marinoff Scale: 0/3   PREGNANCY Vaginal deliveries 0 C-section deliveries 0      OBJECTIVE 07/09/21: Notable Rt obturator internus restriction and tenderness palpated with internal vaginal assessment      OBJECTIVE 07/02/21:  COGNITION:            Overall cognitive status: Within functional limits for tasks assessed                          SENSATION:            Light touch: Appears intact            Proprioception: Appears intact     FUNCTIONAL TESTS:  -Squat - Rt weight shift -Single leg stance: Lt WNL, Rt WNL but pelvic tightness - pt states that if she was in pain flare up that would bother her -Lt single leg stance pulling Rt shoe off causes pain in Rt groin   GAIT: WNL   POSTURE:  Rt lumbar scoliosis; reduced spinal curvature throughout   LUMBARAROM/PROM Flexion 100%, extension 25%, bil side bend 100%, Rt anterior hip pinching with Lt rotation     LE ROM:             Pain with Rt hip IR             Scour (-) Rt     LE MMT:             Bil hip flexion 4/5, Bil hip IR/ER 4/5, Bil hip extension/abduction/adduction 3+/5   PELVIC MMT: not assessed today           PALPATION:   General  Tenderness to palpation in Rt lower abdomen/hip flexors, adductors, obturator internal/iscial tuberosity, Rt gluteals/hip rotators with sig trigger points                 External Perineal Exam NA                             Internal Pelvic Floor NA   TONE: NA   PROLAPSE: NA   TODAY'S TREATMENT 08/03/21 Neuromuscular re-education: Core retraining:  Cues with breath coordination Core facilitation: Side plank with clam shell 10x bil Leg extensions 10x  bil Dead bug 2 x 10 Exercises: Strengthening: Pallof press in staggered stance 10x bil Sidestepping with red band 3 laps 3-way kick 10x ea, bil Lateral lunges 10x bil   TREATMENT 07/29/21:  Manual: Soft tissue mobilization: Scar tissue mobilization: Myofascial release: Instrument assisted to Rt adductors/hamstrings and ischial borders Obturator internus external release Spinal mobilization: Internal pelvic floor techniques: No emotional/communication barriers or cognitive limitation. Patient is motivated to learn. Patient understands and agrees with treatment goals and plan. PT explains patient will be examined in standing, sitting, and lying down to see how their muscles and joints work. When they are ready, they will be asked to remove their underwear so PT can examine their perineum. The patient is also given the option of providing their own chaperone as one is not provided in our facility. The patient also has the right and is explained the right to defer or refuse any part of the evaluation or treatment including the internal exam. With the patient's consent, PT will use one gloved finger to gently assess the muscles of the pelvic floor, seeing how well it contracts and relaxes and if there is muscle symmetry. After, the patient will get dressed and PT and patient will discuss exam findings and plan of care. PT and patient discuss plan of care, schedule, attendance policy and HEP activities.  Dry needling: Neuromuscular re-education: Core retraining:  Core facilitation: Form correction: Pelvic floor contraction training: Down training: Exercises: Stretches/mobility: Strengthening: SL hip adduction 10x  Lateral lunges 10x bil Therapeutic activities: Functional strengthening activities: Self-care:    TREATMENT 07/22/21: Manual: Soft tissue mobilization: Scar tissue mobilization: Myofascial release: Instrument assisted to Rt adductors/hamstrings and ischial  borders Obturator internus external release Spinal mobilization: Internal pelvic floor techniques: No emotional/communication barriers or cognitive limitation. Patient is motivated to learn. Patient understands and agrees with treatment goals and plan. PT explains patient will be examined in standing, sitting, and lying down to see how their muscles and joints work. When they are ready, they will be asked to remove their underwear so PT can examine their perineum. The patient is also given the option of providing their own chaperone as one is not provided in our facility. The patient also has the right and is explained the right to defer or refuse any part of the evaluation or treatment including the internal exam. With the patient's consent, PT will use one gloved finger to gently assess the muscles of the pelvic floor, seeing how well it contracts and relaxes and if there is muscle symmetry. After, the patient will get dressed and PT and patient will discuss exam findings and plan of care. PT and patient discuss plan of care, schedule, attendance policy and HEP activities.  Dry needling: Neuromuscular re-education: Core retraining:  Core facilitation: Form correction: Pelvic floor contraction training: Down training: Exercises: Stretches/mobility: Strengthening: Therapeutic activities: Functional strengthening activities: Self-care: Pelvic floor muscle wand and use      PATIENT EDUCATION:  Education details: Exercise progressions Person educated: Patient Education method: Consulting civil engineer, Demonstration, Corporate treasurer cues, Verbal cues, and Handouts Education comprehension: verbalized understanding     HOME EXERCISE PROGRAM: [id number]   ASSESSMENT:   CLINICAL IMPRESSION: Due to patient realizing that she doesn't have many more PT appointments this year and wanting to save them, agree that D/C today is in her best interest. She is also doing very well with pain management. Focus of today's  treatment session was progressing HEP and making sure she felt confident with all exercises. She did very well with all progressions, demonstrating appropriate form and challenge. We discussed calling with any questions and concerns. Believe she does have the tools at this point to continue making progress and seeing further pain relief;  she is prepared to D/C skilled PT intervention.     OBJECTIVE IMPAIRMENTS decreased activity tolerance, decreased endurance, decreased mobility, decreased ROM, decreased strength, hypomobility, increased fascial restrictions, increased muscle spasms, impaired tone, postural dysfunction, and pain.    ACTIVITY LIMITATIONS  gardening, dressing .    PERSONAL FACTORS 1 comorbidity: Bil TKA  are also affecting patient's functional outcome.      REHAB POTENTIAL: Good   CLINICAL DECISION MAKING: Stable/uncomplicated   EVALUATION COMPLEXITY: Low     GOALS: Goals reviewed with patient? Yes   SHORT TERM GOALS: Target date: 07/30/2021   Pt will be independent with HEP.    Baseline: Goal status: MET   2.  Pt will be able to perform single leg stance of Lt LE and pull Rt shoe off and perform Lt rotation without any increase in Rt pelvic pain.  Baseline:  Goal status: MET       LONG TERM GOALS: Target date: 09/24/2021   Pt will be independent with advanced HEP.    Baseline:  Goal status: MET   2.  Pt will be able to perform all gardening activities without any increase in Rt pelvic pain.  Baseline:  Goal status: MET   3.  Pt will increase all impaired lumbar A/ROM by 25% in order to help restore normal lumbopelvic balance.  Baseline:  Goal status: IN PROGRESS   4.  Pt will demonstrate increase in all impaired hip strength by 1 muscle grades in order to demonstrate improved lumbopelvic support and increase functional ability.    Baseline:  Goal status: IN PROGRESS   5.  Pt will demonstrate normalized muscle tone in Rt pelvic musculature in order to  help reduce pain and restore function.  Baseline:  Goal status: IN PROGRESS     PLAN: PT FREQUENCY: 1x/week   PT DURATION: 12 weeks   PLANNED INTERVENTIONS: Therapeutic exercises, Therapeutic activity, Neuromuscular re-education, Balance training, Gait training, Patient/Family education, Joint mobilization, Dry Needling, Biofeedback, and Manual therapy   PLAN FOR NEXT SESSION: D/C   PHYSICAL THERAPY DISCHARGE SUMMARY  Visits from Start of Care: 6  Current functional level related to goals / functional outcomes: Independent with HEP   Remaining deficits: See above   Education / Equipment: HEP   Patient agrees to discharge. Patient goals were partially met. Patient is being discharged due to  Patient has to tools to continue making progress and resolve goals on her own; encouraged to call with any questions or concerns.    Heather Roberts, PT, DPT05/22/239:29 AM

## 2021-09-07 ENCOUNTER — Other Ambulatory Visit: Payer: Self-pay | Admitting: Obstetrics and Gynecology

## 2021-09-07 DIAGNOSIS — Z1231 Encounter for screening mammogram for malignant neoplasm of breast: Secondary | ICD-10-CM

## 2021-10-05 NOTE — Progress Notes (Signed)
Office Visit Note  Patient: Michele Bennett             Date of Birth: 1960/12/16           MRN: [id number]             PCP: Binnie Rail, MD Referring: Binnie Rail, MD Visit Date: 10/16/2021 Occupation: '@GUAROCC'$ @  Subjective:  Follow-up (Aches and pains. )   History of Present Illness: Michele Bennett is a 61 y.o. female history of osteoarthritis, and degenerative disc disease.  She states that the right middle trigger finger resolved.  She continues to have pain and stiffness in her bilateral hands.  She describes pain in her bilateral CMC joints in her right middle finger.  She has not noticed any joint swelling.  Her right lateral epicondylitis resolved.  She does not have planter fasciitis.  She had left total knee replacement in February 2023.  She had physical therapy and is gradually recovering from that.  She continues to have some stiffness in her cervical and lumbar spine.  She states she has been having a lot of lower back pain.  She was evaluated by Dr. Para Skeans at [facility] who did not recommend surgery.  She has had cortisone injections in the past which were not effective.  She has also had physical therapy.  She would like to know what else she can do for her back.  She takes meloxicam occasionally which has been effective for her.  She would like for me to prescribe meloxicam in the future.  Activities of Daily Living:  Patient reports morning stiffness for 5-10 minutes.   Patient Reports nocturnal pain.  Difficulty dressing/grooming: Reports Difficulty climbing stairs: Denies Difficulty getting out of chair: Denies Difficulty using hands for taps, buttons, cutlery, and/or writing: Reports  Review of Systems  Constitutional:  Positive for fatigue.  HENT:  Negative for mouth sores and mouth dryness.   Eyes:  Positive for dryness.  Respiratory:  Negative for shortness of breath.   Cardiovascular:  Negative for chest pain and palpitations.   Gastrointestinal:  Negative for blood in stool, constipation and diarrhea.  Endocrine: Negative for increased urination.  Genitourinary:  Negative for involuntary urination.  Musculoskeletal:  Positive for joint pain, joint pain, joint swelling, myalgias, muscle weakness, morning stiffness, muscle tenderness and myalgias.  Skin:  Negative for color change, rash, hair loss and sensitivity to sunlight.  Allergic/Immunologic: Negative for susceptible to infections.  Neurological:  Negative for dizziness and headaches.  Hematological:  Negative for swollen glands.  Psychiatric/Behavioral:  Positive for sleep disturbance. Negative for depressed mood. The patient is not nervous/anxious.     PMFS History:  Patient Active Problem List   Diagnosis Date Noted   Sleep difficulties 07/21/2021   Right flank pain 01/21/2021   Tingling of both feet 01/05/2021   Multiple thyroid nodules 07/10/2020   History of SCC (squamous cell carcinoma) of skin 05/16/2020   Thyroid nodule-benign-no follow-up needed 12/12/2019   Spondylosis without myelopathy or radiculopathy, lumbar region 03/14/2019   Chest tightness 10/05/2018   Primary osteoarthritis of both knees 12/09/2016   Plantar fasciitis 12/09/2016   Primary osteoarthritis of both feet 12/09/2016   Primary osteoarthritis of both hands 12/09/2016   DDD (degenerative disc disease), lumbar 12/09/2016   Lateral epicondylitis, right elbow 12/09/2016   Lumbar radiculopathy 02/25/2016   DDD (degenerative disc disease), cervical 10/19/2012   PVCs (premature ventricular contractions) 03/12/2011   Sebaceous cyst 10/14/2010   Vitamin D deficiency  01/01/2008   DJD (degenerative joint disease), multiple sites 01/01/2008    Past Medical History:  Diagnosis Date   Allergy    Arthritis    Cancer ([place]) 2019   basal cell on chest and back   Cyst    hip; probable epidemoid inclusion cyst; resolution with antibiotics   Right knee meniscal tear  07/2011   Dr  Hal Morales, Ortho; S/P cortisone injection   Spinal stenosis at L4-L5 level 2017   bilateral   Squamous cell carcinoma of leg, left 2022   Tear of left acetabular labrum 2006 ?   Dr Sallyanne Havers   Thyroid nodule    dx by Dr. Maxie Better   Vitamin D deficiency     Family History  Problem Relation Age of Onset   Parkinsonism Father    Colon polyps Father        No colon cancer   Transient ischemic attack Father    Breast cancer Mother 26   Coronary artery disease Mother        ? radiation related   Arthritis Mother        DJD   Kidney disease Mother    Cancer Mother    Hypertension Mother    Hyperlipidemia Mother    Thyroid disease Mother    Arthritis Maternal Grandmother        DJD   Diabetes Maternal Grandfather    Heart attack Neg Hx    Past Surgical History:  Procedure Laterality Date   ANKLE SURGERY  1989   Post fracture    DILATION AND CURETTAGE OF UTERUS  1988   FOOT SURGERY     left- bone spurs, Dr Mayer Camel   HAND SURGERY     Thumb surgery x 2   KNEE ARTHROPLASTY     KNEE ARTHROSCOPY Right 2013   LAPAROTOMY  1988   For Dysmenorrhea   REPLACEMENT TOTAL KNEE Right 11/2018   REPLACEMENT TOTAL KNEE Left 04/27/2021   Social History   Social History Narrative   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  Walks dog, gardening, PT for back   Immunization History  Administered Date(s) Administered   Influenza Split 12/21/2011   Influenza Whole 12/20/2007, 12/09/2008   Influenza,inj,Quad PF,6+ Mos 12/20/2012, 01/02/2015, 12/16/2015, 12/09/2016, 01/10/2018, 12/22/2018, 12/21/2019, 12/31/2020   PFIZER(Purple Top)SARS-COV-2 Vaccination 05/25/2019, 06/15/2019   Td 10/04/2005   Tdap 12/16/2015   Zoster Recombinat (Shingrix) 01/10/2018, 03/17/2018     Objective: Vital Signs: BP (!) 150/100 (BP Location: Right Arm, Patient Position: Sitting, Cuff Size: Small)   Pulse 73   Resp 14   Ht '5\' 7"'$  (1.702 m)   Wt 157 lb 6.4 oz (71.4 kg)   LMP 02/23/2018 (Approximate)   BMI 24.65 kg/m     Physical Exam Vitals and nursing note reviewed.  Constitutional:      Appearance: She is well-developed.  HENT:     Head: Normocephalic and atraumatic.  Eyes:     Conjunctiva/sclera: Conjunctivae normal.  Cardiovascular:     Rate and Rhythm: Normal rate and regular rhythm.     Heart sounds: Normal heart sounds.  Pulmonary:     Effort: Pulmonary effort is normal.     Breath sounds: Normal breath sounds.  Abdominal:     General: Bowel sounds are normal.     Palpations: Abdomen is soft.  Musculoskeletal:     Cervical back: Normal range of motion.  Lymphadenopathy:     Cervical: No cervical adenopathy.  Skin:    General: Skin is warm  and dry.     Capillary Refill: Capillary refill takes less than 2 seconds.  Neurological:     Mental Status: She is alert and oriented to person, place, and time.  Psychiatric:        Behavior: Behavior normal.      Musculoskeletal Exam: She had limited lateral rotation of the cervical spine.  She had discomfort range of motion of her lumbar spine.  Shoulder joints, elbow joints, wrist joints, MCPs PIPs and DIPs with good range of motion.  She had bilateral PIP DIP and CMC thickening without any synovitis.  Hip joints were in good range of motion.  Bilateral knee joints were replaced with some warmth on palpation of her bilateral knee joints and limited flexion of her left knee joint.  There is thickening of the right ankle joint due to previous injury.  No synovitis was noted.  There was no tenderness over ankles or MTPs.  CDAI Exam: CDAI Score: -- Patient Global: --; Provider Global: -- Swollen: --; Tender: -- Joint Exam 10/16/2021   No joint exam has been documented for this visit   There is currently no information documented on the homunculus. Go to the Rheumatology activity and complete the homunculus joint exam.  Investigation: No additional findings.  Imaging: MM 3D SCREEN BREAST BILATERAL  Result Date: 10/08/2021 CLINICAL DATA:   Screening. EXAM: DIGITAL SCREENING BILATERAL MAMMOGRAM WITH TOMOSYNTHESIS AND CAD TECHNIQUE: Bilateral screening digital craniocaudal and mediolateral oblique mammograms were obtained. Bilateral screening digital breast tomosynthesis was performed. The images were evaluated with computer-aided detection. COMPARISON:  Previous exam(s). ACR Breast Density Category b: There are scattered areas of fibroglandular density. FINDINGS: In the right breast , a technical repeat is needed for more posterior tissue on the RIGHT CC view In the left breast , calcifications requires further evaluation. IMPRESSION: Further evaluation is suggested for calcifications in the left breast. These calcifications are seen within the lower outer quadrant of the LEFT breast. Technical repeat is recommended for more posterior tissue on the RIGHT CC view. RECOMMENDATION: Diagnostic mammogram and possibly ultrasound of both breasts. (Code:[id number]) The patient will be contacted regarding the findings, and additional imaging will be scheduled. BI-RADS CATEGORY  0: Incomplete. Need additional imaging evaluation and/or prior mammograms for comparison. Electronically Signed   By: Franki Cabot M.D.   On: 10/08/2021 11:01    Recent Labs: Lab Results  Component Value Date   WBC 5.1 01/05/2021   HGB 12.2 01/05/2021   PLT 244.0 01/05/2021   NA 139 01/05/2021   K 3.7 01/05/2021   CL 103 01/05/2021   CO2 28 01/05/2021   GLUCOSE 90 01/05/2021   BUN 15 01/05/2021   CREATININE 0.96 01/05/2021   BILITOT 0.6 01/05/2021   ALKPHOS 68 01/05/2021   AST 18 01/05/2021   ALT 15 01/05/2021   PROT 7.0 01/05/2021   ALBUMIN 4.3 01/05/2021   CALCIUM 9.6 01/05/2021   GFRAA 87 12/14/2016   March 18, 2021 CBC WBC 6.5, hemoglobin 12.9, platelets 279, CMP normal creatinine 0.98, AST 20, ALT 16, hemoglobin's A1c 5.6%   Speciality Comments: No specialty comments available.  Procedures:  No procedures performed Allergies: Celebrex [celecoxib]    Assessment / Plan:     Visit Diagnoses: Primary osteoarthritis of both hands-she had bilateral PIP and DIP thickening and CMC thickening.  No synovitis was noted.  She has been having discomfort in her right third MCP and PIP joint.  Joint protection muscle strengthening was discussed.  Arthritis of carpometacarpal (CMC) joint  of right thumb-she has a [place] brace which she has been not using on a regular basis.  Use of CMC brace was emphasized.  Trigger middle finger of right hand - Resolved.   Status post total knee replacement, bilateral - November 21, 2018 by Dr. Margretta Sidle at [facility]. 04/27/2021 LTKR by Dr. Margretta Sidle.  She had been through physical therapy and she is gradually improving.  Primary osteoarthritis of both feet-she has been having some discomfort in her right ankle where she had surgery in the past.  No synovitis was noted.  Proper fitting shoes were discussed.  DDD (degenerative disc disease), cervical - she has been followed by Dr. Maxie Better.  Stretching exercises were demonstrated.  DDD (degenerative disc disease), lumbar-she has been having increased pain and discomfort in her lower back.  She has been to a neurosurgeon at [facility] who did not advise surgery.  She had an adequate response to cortisone injections in the past.  I demonstrated multiple exercises for core strengthening in the office today.  Long term (current) use of non-steroidal anti-inflammatories (nsaid)-patient would like for [place] to prescribe meloxicam in the future.  She takes meloxicam only on as needed basis.  Indications side effects contraindications of meloxicam were discussed at length.  Increased risk of GI bleed, nephrotoxicity and hepatotoxicity were also discussed.  Patient will call us in the future when she needs meloxicam prescription.  Her dose will be meloxicam 15 mg p.o. daily total 30 tablets with no refills.  Medication monitoring encounter -as she has been taking long-term meloxicam  Biloptin labs today.  Plan: CBC with Differential/Platelet, COMPLETE METABOLIC PANEL WITH GFR  Vitamin D deficiency-she is on vitamin D supplement.  Lateral epicondylitis, right elbow - Resolved.   Encounter for osteoporosis screening in asymptomatic postmenopausal patient - DEXA 12/12/2019 T-score: 0.9, BMD: 0.949 right femoral neck.   Orders: Orders Placed This Encounter  Procedures   CBC with Differential/Platelet   COMPLETE METABOLIC PANEL WITH GFR   No orders of the defined types were placed in this encounter.   Face-to-face time spent with patient was over 45 minutes. Greater than 50% of time was spent in counseling and coordination of care.  Follow-Up Instructions: No follow-ups on file.   Bo Merino, MD  Note - This record has been created using Editor, commissioning.  Chart creation errors have been sought, but may not always  have been located. Such creation errors do not reflect on  the standard of medical care.

## 2021-10-06 LAB — HM COLONOSCOPY

## 2021-10-07 ENCOUNTER — Ambulatory Visit
Admission: RE | Admit: 2021-10-07 | Discharge: 2021-10-07 | Disposition: A | Discharge status: Home / Self Care | Payer: BC Managed Care – PPO | Source: Ambulatory Visit | Attending: Obstetrics and Gynecology | Admitting: Obstetrics and Gynecology

## 2021-10-07 DIAGNOSIS — Z1231 Encounter for screening mammogram for malignant neoplasm of breast: Secondary | ICD-10-CM

## 2021-10-09 ENCOUNTER — Other Ambulatory Visit: Payer: Self-pay | Admitting: Obstetrics and Gynecology

## 2021-10-09 DIAGNOSIS — R928 Other abnormal and inconclusive findings on diagnostic imaging of breast: Secondary | ICD-10-CM

## 2021-10-16 ENCOUNTER — Encounter: Payer: Self-pay | Admitting: Rheumatology

## 2021-10-16 ENCOUNTER — Ambulatory Visit: Payer: BC Managed Care – PPO | Attending: Rheumatology | Admitting: Rheumatology

## 2021-10-16 VITALS — BP 150/100 | HR 73 | Resp 14 | Ht 67.0 in | Wt 157.4 lb

## 2021-10-16 DIAGNOSIS — M722 Plantar fascial fibromatosis: Secondary | ICD-10-CM

## 2021-10-16 DIAGNOSIS — M1811 Unilateral primary osteoarthritis of first carpometacarpal joint, right hand: Secondary | ICD-10-CM | POA: Diagnosis not present

## 2021-10-16 DIAGNOSIS — M19041 Primary osteoarthritis, right hand: Secondary | ICD-10-CM

## 2021-10-16 DIAGNOSIS — Z780 Asymptomatic menopausal state: Secondary | ICD-10-CM

## 2021-10-16 DIAGNOSIS — M19071 Primary osteoarthritis, right ankle and foot: Secondary | ICD-10-CM

## 2021-10-16 DIAGNOSIS — E559 Vitamin D deficiency, unspecified: Secondary | ICD-10-CM

## 2021-10-16 DIAGNOSIS — M7711 Lateral epicondylitis, right elbow: Secondary | ICD-10-CM

## 2021-10-16 DIAGNOSIS — Z13820 Encounter for screening for osteoporosis: Secondary | ICD-10-CM

## 2021-10-16 DIAGNOSIS — M1712 Unilateral primary osteoarthritis, left knee: Secondary | ICD-10-CM

## 2021-10-16 DIAGNOSIS — Z96653 Presence of artificial knee joint, bilateral: Secondary | ICD-10-CM | POA: Diagnosis not present

## 2021-10-16 DIAGNOSIS — M19042 Primary osteoarthritis, left hand: Secondary | ICD-10-CM

## 2021-10-16 DIAGNOSIS — Z96651 Presence of right artificial knee joint: Secondary | ICD-10-CM

## 2021-10-16 DIAGNOSIS — M19072 Primary osteoarthritis, left ankle and foot: Secondary | ICD-10-CM

## 2021-10-16 DIAGNOSIS — M65331 Trigger finger, right middle finger: Secondary | ICD-10-CM

## 2021-10-16 DIAGNOSIS — M5030 Other cervical disc degeneration, unspecified cervical region: Secondary | ICD-10-CM

## 2021-10-16 DIAGNOSIS — M51369 Other intervertebral disc degeneration, lumbar region without mention of lumbar back pain or lower extremity pain: Secondary | ICD-10-CM

## 2021-10-16 DIAGNOSIS — Z5181 Encounter for therapeutic drug level monitoring: Secondary | ICD-10-CM

## 2021-10-16 DIAGNOSIS — Z791 Long term (current) use of non-steroidal anti-inflammatories (NSAID): Secondary | ICD-10-CM

## 2021-10-16 DIAGNOSIS — M5136 Other intervertebral disc degeneration, lumbar region: Secondary | ICD-10-CM

## 2021-10-16 NOTE — Patient Instructions (Signed)

## 2021-10-17 LAB — COMPLETE METABOLIC PANEL WITH GFR
AG Ratio: 1.8 (calc) (ref 1.0–2.5)
ALT: 14 U/L (ref 6–29)
AST: 18 U/L (ref 10–35)
Albumin: 4.7 g/dL (ref 3.6–5.1)
Alkaline phosphatase (APISO): 74 U/L (ref 37–153)
BUN: 14 mg/dL (ref 7–25)
CO2: 29 mmol/L (ref 20–32)
Calcium: 10.1 mg/dL (ref 8.6–10.4)
Chloride: 104 mmol/L (ref 98–110)
Creat: 1 mg/dL (ref 0.50–1.03)
Globulin: 2.6 g/dL (calc) (ref 1.9–3.7)
Glucose, Bld: 92 mg/dL (ref 65–139)
Potassium: 4.3 mmol/L (ref 3.5–5.3)
Sodium: 139 mmol/L (ref 135–146)
Total Bilirubin: 0.5 mg/dL (ref 0.2–1.2)
Total Protein: 7.3 g/dL (ref 6.1–8.1)
eGFR: 65 mL/min/{1.73_m2} (ref >=60)

## 2021-10-17 LAB — CBC WITH DIFFERENTIAL/PLATELET
Absolute Monocytes: 490 cells/uL (ref 200–950)
Basophils Absolute: 60 cells/uL (ref 0–200)
Basophils Relative: 1.2 %
Eosinophils Absolute: 60 cells/uL (ref 15–500)
Eosinophils Relative: 1.2 %
HCT: 40 % (ref 35.0–45.0)
Hemoglobin: 13.5 g/dL (ref 11.7–15.5)
Lymphs Abs: 1035 cells/uL (ref 850–3900)
MCH: 31.5 pg (ref 27.0–33.0)
MCHC: 33.8 g/dL (ref 32.0–36.0)
MCV: 93.5 fL (ref 80.0–100.0)
MPV: 10.3 fL (ref 7.5–12.5)
Monocytes Relative: 9.8 %
Neutro Abs: 3355 cells/uL (ref 1500–7800)
Neutrophils Relative %: 67.1 %
Platelets: 284 10*3/uL (ref 140–400)
RBC: 4.28 10*6/uL (ref 3.80–5.10)
RDW: 12.9 % (ref 11.0–15.0)
Total Lymphocyte: 20.7 %
WBC: 5 10*3/uL (ref 3.8–10.8)

## 2021-10-17 NOTE — Progress Notes (Signed)
CBC and CMP are normal.

## 2021-10-23 ENCOUNTER — Other Ambulatory Visit: Payer: Self-pay | Admitting: Orthopedic Surgery

## 2021-10-23 DIAGNOSIS — T8484XA Pain due to internal orthopedic prosthetic devices, implants and grafts, initial encounter: Secondary | ICD-10-CM

## 2021-10-23 DIAGNOSIS — M25561 Pain in right knee: Secondary | ICD-10-CM

## 2021-11-01 ENCOUNTER — Ambulatory Visit
Admission: RE | Admit: 2021-11-01 | Discharge: 2021-11-01 | Disposition: A | Discharge status: Home / Self Care | Payer: BC Managed Care – PPO | Source: Ambulatory Visit | Attending: Orthopedic Surgery | Admitting: Orthopedic Surgery

## 2021-11-01 DIAGNOSIS — M25561 Pain in right knee: Secondary | ICD-10-CM

## 2021-11-01 DIAGNOSIS — T8484XA Pain due to internal orthopedic prosthetic devices, implants and grafts, initial encounter: Secondary | ICD-10-CM

## 2021-11-12 ENCOUNTER — Encounter: Payer: Self-pay | Admitting: Internal Medicine

## 2021-11-12 NOTE — Progress Notes (Signed)
Has been scanned in.

## 2021-11-17 ENCOUNTER — Other Ambulatory Visit: Payer: Self-pay | Admitting: Obstetrics and Gynecology

## 2021-11-17 ENCOUNTER — Ambulatory Visit: Payer: BC Managed Care – PPO

## 2021-11-17 ENCOUNTER — Ambulatory Visit
Admission: RE | Admit: 2021-11-17 | Discharge: 2021-11-17 | Disposition: A | Discharge status: Home / Self Care | Payer: BC Managed Care – PPO | Source: Ambulatory Visit | Attending: Obstetrics and Gynecology | Admitting: Obstetrics and Gynecology

## 2021-11-17 DIAGNOSIS — R921 Mammographic calcification found on diagnostic imaging of breast: Secondary | ICD-10-CM

## 2021-11-17 DIAGNOSIS — R928 Other abnormal and inconclusive findings on diagnostic imaging of breast: Secondary | ICD-10-CM

## 2021-11-24 ENCOUNTER — Ambulatory Visit
Admission: RE | Admit: 2021-11-24 | Discharge: 2021-11-24 | Disposition: A | Discharge status: Home / Self Care | Payer: BC Managed Care – PPO | Source: Ambulatory Visit | Attending: Obstetrics and Gynecology | Admitting: Obstetrics and Gynecology

## 2021-11-24 DIAGNOSIS — R921 Mammographic calcification found on diagnostic imaging of breast: Secondary | ICD-10-CM

## 2021-11-25 ENCOUNTER — Other Ambulatory Visit: Payer: Self-pay | Admitting: Internal Medicine

## 2021-11-25 ENCOUNTER — Telehealth: Payer: Self-pay

## 2021-11-25 DIAGNOSIS — E042 Nontoxic multinodular goiter: Secondary | ICD-10-CM

## 2021-11-25 NOTE — Telephone Encounter (Signed)
Pt lvm to have an ultrasound ordered.

## 2021-11-25 NOTE — Telephone Encounter (Signed)
Ok, I did.

## 2021-11-26 NOTE — Telephone Encounter (Signed)
Patient scheduled 02/22/22 @ 1:20 pm for [employer]

## 2021-11-29 ENCOUNTER — Encounter: Payer: Self-pay | Admitting: Internal Medicine

## 2021-11-30 ENCOUNTER — Ambulatory Visit
Admission: RE | Admit: 2021-11-30 | Discharge: 2021-11-30 | Disposition: A | Discharge status: Home / Self Care | Payer: BC Managed Care – PPO | Source: Ambulatory Visit | Attending: Internal Medicine | Admitting: Internal Medicine

## 2021-11-30 DIAGNOSIS — E042 Nontoxic multinodular goiter: Secondary | ICD-10-CM

## 2021-12-01 MED ORDER — TRIAMCINOLONE ACETONIDE 0.1 % MT PSTE: 1.0000 | PASTE | Freq: Two times a day (BID) | OROMUCOSAL | 12 refills | Status: AC

## 2021-12-18 ENCOUNTER — Other Ambulatory Visit: Payer: Self-pay | Admitting: *Deleted

## 2021-12-18 ENCOUNTER — Encounter: Payer: Self-pay | Admitting: Rheumatology

## 2021-12-18 MED ORDER — MELOXICAM 15 MG PO TABS: 15.0000 mg | ORAL_TABLET | Freq: Every day | ORAL | 0 refills | Status: DC | PRN

## 2021-12-18 NOTE — Telephone Encounter (Signed)
She was advised to take meloxicam 15 mg p.o. daily only on as needed basis.  It is okay to send a prescription for meloxicam 15 mg p.o. as needed with meals.

## 2022-01-19 ENCOUNTER — Telehealth: Payer: Self-pay | Admitting: *Deleted

## 2022-01-19 ENCOUNTER — Encounter: Payer: Self-pay | Admitting: *Deleted

## 2022-01-19 NOTE — Patient Outreach (Signed)
  Care Coordination   01/19/2022 Name: Michele Bennett MRN: [id number] DOB: 04-Aug-1960   Care Coordination Outreach Attempts:  An unsuccessful telephone outreach was attempted today to offer the patient information about available care coordination services as a benefit of their health plan.   Follow Up Plan:  Additional outreach attempts will be made to offer the patient care coordination information and services.   Encounter Outcome:  No Answer left voice message requesting call back  Care Coordination Interventions Activated:  No   Care Coordination Interventions:  No, not indicated unsuccessful outreach attempt # 1    Oneta Rack, RN, BSN, CCRN Alumnus RN CM Care Coordination/ Transition of [place] Management [phone]: direct office

## 2022-01-29 ENCOUNTER — Telehealth: Payer: Self-pay | Admitting: *Deleted

## 2022-01-29 ENCOUNTER — Encounter: Payer: Self-pay | Admitting: *Deleted

## 2022-01-29 NOTE — Patient Outreach (Signed)
  Care Coordination   Initial Visit Note   01/29/2022 Name: Maleiah Dula MRN: [id number] DOB: 1960-10-31  Prudencio Pair Harder is a 61 y.o. year old female who sees Burns, Claudina Lick, MD for primary care. I spoke with  Babette Relic by phone today.  What matters to the patients health and wellness today?  "I am doing well.  Still having the same sleep problems-- I fall asleep fine, but just wake up and can't go back to sleep.  The trazodone Dr. Quay Burow prescribed didn't really help me, so I don't take it any more.  I would really like to avoid taking medications for it, and have tried everything over the counter and herbal there is to try.  Otherwise, I am doing well, not having any problems.  Thanks for this information about the flu clinics, I will call them to schedule"  No further or ongoing care coordination needs identified today    Goals Addressed             This Visit's Progress    COMPLETED: Care Coordination Activities: No further follow up required   On track    Care Coordination Interventions: Evaluation of current treatment plan related to sleep disturbance/ chronic back pain and patient's adherence to plan as established by provider Advised patient to provide appropriate vaccination information to provider or CM team member at next visit Reviewed medications with patient and discussed adherence: patient reports takes very few medications; self-manages Screening for signs and symptoms of depression related to chronic disease state  Assessed social determinant of health barriers Reviewed last PCP office visit 07/21/21 with patient; discussed/ provided education around various strategies for sleep disturbance; encouraged patient to consider obtaining flu vaccine for 2023-24 flu/ winter season, and provided details of upcoming flu clinics available with contact information for scheduling          SDOH assessments and interventions completed:   Yes  SDOH Interventions Today    Flowsheet Row Most Recent Value  SDOH Interventions   Food Insecurity Interventions Intervention Not Indicated  Transportation Interventions Intervention Not Indicated  [drives self]       Care Coordination Interventions Activated:  Yes  Care Coordination Interventions:  Yes, provided   Follow up plan: No further intervention required.   Encounter Outcome:  Pt. Visit Completed   Oneta Rack, RN, BSN, CCRN Alumnus RN CM Care Coordination/ Transition of [place] Management [phone]: direct office

## 2022-01-29 NOTE — Patient Instructions (Signed)
Visit Information  Thank you for taking time to visit with me today. Please don't hesitate to contact me if I can be of assistance to you.   Following are the goals we discussed today:   Goals Addressed             This Visit's Progress    COMPLETED: Care Coordination Activities: No further follow up required   On track    Care Coordination Interventions: Evaluation of current treatment plan related to sleep disturbance/ chronic back pain and patient's adherence to plan as established by provider Advised patient to provide appropriate vaccination information to provider or CM team member at next visit Reviewed medications with patient and discussed adherence: patient reports takes very few medications; self-manages Screening for signs and symptoms of depression related to chronic disease state  Assessed social determinant of health barriers Reviewed last PCP office visit 07/21/21 with patient; discussed/ provided education around various strategies for sleep disturbance; encouraged patient to consider obtaining flu vaccine for 2023-24 flu/ winter season, and provided details of upcoming flu clinics available with contact information for scheduling          If you are experiencing a Mental Health or [place] or need someone to talk to, please  call the Suicide and Crisis Lifeline: 988 call the [place] National Suicide Prevention Lifeline: (514) 145-8739 or TTY: (567)257-9755 TTY 250-020-3783) to talk to a trained counselor call 1-800-273-TALK (toll free, 24 hour hotline) go to [facility] [address] [place] [phone]) call the [place]: [phone] call 911   Patient verbalizes understanding of instructions and care plan provided today and agrees to view in [place]. Active MyChart status and patient understanding of how to access instructions and care plan via MyChart confirmed with patient.     No  further follow up required: denies ongoing/ further care coordination needs  Oneta Rack, RN, BSN, CCRN Alumnus RN CM Care Coordination/ Transition of [place] Management [phone]: direct office

## 2022-02-15 ENCOUNTER — Ambulatory Visit (INDEPENDENT_AMBULATORY_CARE_PROVIDER_SITE_OTHER): Payer: BC Managed Care – PPO

## 2022-02-15 DIAGNOSIS — Z23 Encounter for immunization: Secondary | ICD-10-CM

## 2022-02-15 NOTE — Progress Notes (Signed)
After obtaining consent, and per orders of Dr. Quay Burow, injection of Regular Flu shot was given in the left deltoid by Marrian Salvage. Patient tolerated well and instructed to report any adverse reaction to me immediately.

## 2022-02-22 ENCOUNTER — Ambulatory Visit: Payer: BC Managed Care – PPO | Admitting: Internal Medicine

## 2022-02-23 ENCOUNTER — Encounter: Payer: Self-pay | Admitting: Internal Medicine

## 2022-02-24 ENCOUNTER — Ambulatory Visit: Payer: BC Managed Care – PPO | Admitting: Internal Medicine

## 2022-02-24 NOTE — Progress Notes (Addendum)
Subjective:    Patient ID: Michele Bennett, female    DOB: [birth year], 61 y.o.   MRN: [id number]      HPI Genean is here for  Chief Complaint  Patient presents with   Sinusitis    Sinusitis and sleep problems    She is here for an acute visit for cold symptoms.   Her symptoms started about one month ago  She is experiencing nasal congestion with discolored mucus - blood more recently, PND, headaches - symptoms persistent and not improving.  No cough, wheeze, SOB.    She has tried taking benadryl, nyquil    Sleep issues - no issue falling asleep  - just staying asleep.  Trazodone didn't help.  She thinks it is all her back pain which is chronic.  Taking tart cherry and magnesium at night.   Wonders what else she can do.     Chronic low back pain with intermittent right sciatica.  She has chronic left lateral thigh numbness.  Back pain worsens with certain activities-bending and lifting.  Also has chronic neck pain.  Occasionally has pain in the right C8 distribution of her right arm without weakness.  Has been following at [facility] is not a surgical candidate.  Has had injections, done physical therapy.  Medications and allergies reviewed with patient and updated if appropriate.  Current Outpatient Medications on File Prior to Visit  Medication Sig Dispense Refill   acetaminophen (TYLENOL) 500 MG tablet Take by mouth as needed.      Cholecalciferol (VITAMIN D3) 2000 UNITS TABS Take 5,000 Units by mouth daily.     diclofenac sodium (VOLTAREN) 1 % GEL Apply 4 g topically 4 (four) times daily. (Patient taking differently: Apply 4 g topically as needed.) 100 g 3   estradiol (DOTTI) 0.075 MG/24HR PLACE ONE PATCH ONTO THE SKIN TWO TIMES A WEEK 24 patch 3   fexofenadine (ALLEGRA) 180 MG tablet Take 180 mg by mouth as needed.     gabapentin (NEURONTIN) 300 MG capsule      magnesium oxide (MAG-OX) 400 MG tablet Take 400 mg by mouth daily.     meloxicam (MOBIC) 15  MG tablet Take 15 mg by mouth daily.     Misc Natural Products (TART CHERRY ADVANCED PO) Take by mouth.     progesterone (PROMETRIUM) 100 MG capsule Take 1 capsule (100 mg total) by mouth at bedtime. 90 capsule 3   triamcinolone (KENALOG) 0.1 % paste Use as directed 1 Application in the mouth or throat 2 (two) times daily. 5 g 12   TURMERIC PO Take by mouth daily.     Zinc 25 MG TABS Take 1 tablet by mouth daily. 20MG     No current facility-administered medications on file prior to visit.    Review of Systems  Constitutional:  Negative for chills and fever.  HENT:  Positive for congestion (discolored mucus and blood in mucus recently), postnasal drip and sinus pain. Negative for ear pain and sore throat.   Respiratory:  Negative for cough, shortness of breath and wheezing.   Neurological:  Positive for headaches.       Objective:   Vitals:   02/25/22 1131  BP: (!) 140/70  Pulse: 71  Temp: 98 F (36.7 C)  SpO2: 99%   BP Readings from Last 3 Encounters:  02/25/22 (!) 140/70  10/16/21 (!) 150/100  07/21/21 118/80   Wt Readings from Last 3 Encounters:  02/25/22 150 lb (68 kg)  10/16/21  157 lb 6.4 oz (71.4 kg)  07/21/21 154 lb (69.9 kg)   Body mass index is 23.49 kg/m.    Physical Exam Constitutional:      General: She is not in acute distress.    Appearance: Normal appearance. She is not ill-appearing.  HENT:     Head: Normocephalic and atraumatic.     Right Ear: Tympanic membrane, ear canal and external ear normal.     Left Ear: Tympanic membrane, ear canal and external ear normal.     Mouth/Throat:     Mouth: Mucous membranes are moist.     Pharynx: No oropharyngeal exudate or posterior oropharyngeal erythema.  Eyes:     Conjunctiva/sclera: Conjunctivae normal.  Cardiovascular:     Rate and Rhythm: Normal rate and regular rhythm.  Pulmonary:     Effort: Pulmonary effort is normal. No respiratory distress.     Breath sounds: Normal breath sounds. No wheezing  or rales.  Musculoskeletal:     Cervical back: Neck supple. No tenderness.  Lymphadenopathy:     Cervical: No cervical adenopathy.  Skin:    General: Skin is warm and dry.  Neurological:     Mental Status: She is alert.            Assessment & Plan:    See Problem List for Assessment and Plan of chronic medical problems.

## 2022-02-25 ENCOUNTER — Encounter: Payer: Self-pay | Admitting: Internal Medicine

## 2022-02-25 ENCOUNTER — Ambulatory Visit (INDEPENDENT_AMBULATORY_CARE_PROVIDER_SITE_OTHER): Payer: BC Managed Care – PPO | Admitting: Internal Medicine

## 2022-02-25 VITALS — BP 136/82 | HR 71 | Temp 98.0°F | Ht 67.0 in | Wt 150.0 lb

## 2022-02-25 DIAGNOSIS — J0100 Acute maxillary sinusitis, unspecified: Secondary | ICD-10-CM | POA: Diagnosis not present

## 2022-02-25 DIAGNOSIS — G8929 Other chronic pain: Secondary | ICD-10-CM

## 2022-02-25 DIAGNOSIS — G479 Sleep disorder, unspecified: Secondary | ICD-10-CM

## 2022-02-25 DIAGNOSIS — M549 Dorsalgia, unspecified: Secondary | ICD-10-CM | POA: Diagnosis not present

## 2022-02-25 MED ORDER — AMOXICILLIN-POT CLAVULANATE 875-125 MG PO TABS: 1.0000 | ORAL_TABLET | Freq: Two times a day (BID) | ORAL | 0 refills | Status: DC

## 2022-02-25 NOTE — Patient Instructions (Addendum)
        Medications changes include :   augmentin     A referral was ordered for Dr Ranell Patrick.     Someone will call you to schedule an appointment.      Return if symptoms worsen or fail to improve.

## 2022-02-25 NOTE — Assessment & Plan Note (Signed)
Acute Likely bacterial  Start Augmentin 875-125 mg BID x 10 day otc cold medications Rest, fluid Call if no improvement  

## 2022-02-25 NOTE — Assessment & Plan Note (Signed)
Chronic No difficulty falling asleep -- issues staying asleep - trazodone not effective She thinks it is all related to her back pain Wondering what she can taking - tylenol, meloxicam Does not want to take gabapentin - concern it will cause dementia Taking tart cherry and magnesium Has seen [facility] specialist for her back - he did not recommend surgery Will refer to Dr Ranell Patrick

## 2022-03-01 ENCOUNTER — Ambulatory Visit: Payer: BC Managed Care – PPO | Admitting: Internal Medicine

## 2022-03-16 ENCOUNTER — Encounter: Payer: Self-pay | Admitting: Physical Medicine and Rehabilitation

## 2022-04-19 ENCOUNTER — Encounter: Payer: Self-pay | Admitting: Internal Medicine

## 2022-04-19 DIAGNOSIS — G8929 Other chronic pain: Secondary | ICD-10-CM

## 2022-04-22 ENCOUNTER — Encounter: Payer: BC Managed Care – PPO | Admitting: Physical Medicine and Rehabilitation

## 2022-05-02 NOTE — Addendum Note (Signed)
Addended by: Binnie Rail on: 05/02/2022 10:48 AM   Modules accepted: Orders

## 2022-05-25 ENCOUNTER — Encounter: Payer: Self-pay | Admitting: Emergency Medicine

## 2022-05-25 ENCOUNTER — Ambulatory Visit (INDEPENDENT_AMBULATORY_CARE_PROVIDER_SITE_OTHER): Payer: BC Managed Care – PPO | Admitting: Emergency Medicine

## 2022-05-25 VITALS — BP 126/74 | HR 73 | Temp 98.5°F | Ht 67.0 in | Wt 149.4 lb

## 2022-05-25 DIAGNOSIS — R051 Acute cough: Secondary | ICD-10-CM | POA: Insufficient documentation

## 2022-05-25 DIAGNOSIS — J22 Unspecified acute lower respiratory infection: Secondary | ICD-10-CM | POA: Diagnosis not present

## 2022-05-25 DIAGNOSIS — R6889 Other general symptoms and signs: Secondary | ICD-10-CM | POA: Diagnosis not present

## 2022-05-25 LAB — POC COVID19 BINAXNOW: SARS Coronavirus 2 Ag: NEGATIVE

## 2022-05-25 LAB — POCT INFLUENZA A/B
Influenza A, POC: NEGATIVE
Influenza B, POC: NEGATIVE

## 2022-05-25 MED ORDER — AZITHROMYCIN 250 MG PO TABS: ORAL_TABLET | ORAL | 0 refills | Status: DC

## 2022-05-25 MED ORDER — HYDROCODONE BIT-HOMATROP MBR 5-1.5 MG/5ML PO SOLN: 5.0000 mL | Freq: Every evening | ORAL | 0 refills | Status: DC | PRN

## 2022-05-25 NOTE — Assessment & Plan Note (Signed)
Continue over-the-counter Mucinex DM Hycodan syrup as needed Recommend to use cough drops and stay well-hydrated Cough management discussed.

## 2022-05-25 NOTE — Patient Instructions (Signed)
  Acute Bronchitis, Adult  Acute bronchitis is when air tubes in the lungs (bronchi) suddenly get swollen. The condition can make it hard for you to breathe. In adults, acute bronchitis usually goes away within 2 weeks. A cough caused by bronchitis may last up to 3 weeks. Smoking, allergies, and asthma can make the condition worse. What are the causes? Germs that cause cold and flu (viruses). The most common cause of this condition is the virus that causes the common cold. Bacteria. Substances that bother (irritate) the lungs, including: Smoke from cigarettes and other types of tobacco. Dust and pollen. Fumes from chemicals, gases, or burned fuel. Indoor or outdoor air pollution. What increases the risk? A weak body's defense system. This is also called the immune system. Any condition that affects your lungs and breathing, such as asthma. What are the signs or symptoms? A cough. Coughing up clear, yellow, or green mucus. Making high-pitched whistling sounds when you breathe, most often when you breathe out (wheezing). Runny or stuffy nose. Having too much mucus in your lungs (chest congestion). Shortness of breath. Body aches. A sore throat. How is this treated? Acute bronchitis may go away over time without treatment. Your doctor may tell you to: Drink more fluids. This will help thin your mucus so it is easier to cough up. Use a device that gets medicine into your lungs (inhaler). Use a vaporizer or a humidifier. These are machines that add water to the air. This helps with coughing and poor breathing. Take a medicine that thins mucus and helps clear it from your lungs. Take a medicine that prevents or stops coughing. It is not common to take an antibiotic medicine for this condition. Follow these instructions at home:  Take over-the-counter and prescription medicines only as told by your doctor. Use an inhaler, vaporizer, or humidifier as told by your doctor. Take two  teaspoons (10 mL) of honey at bedtime. This helps lessen your coughing at night. Drink enough fluid to keep your pee (urine) pale yellow. Do not smoke or use any products that contain nicotine or tobacco. If you need help quitting, ask your doctor. Get a lot of rest. Return to your normal activities when your doctor says that it is safe. Keep all follow-up visits. How is this prevented?  Wash your hands often with soap and water for at least 20 seconds. If you cannot use soap and water, use hand sanitizer. Avoid contact with people who have cold symptoms. Try not to touch your mouth, nose, or eyes with your hands. Avoid breathing in smoke or chemical fumes. Make sure to get the flu shot every year. Contact a doctor if: Your symptoms do not get better in 2 weeks. You have trouble coughing up the mucus. Your cough keeps you awake at night. You have a fever. Get help right away if: You cough up blood. You have chest pain. You have very bad shortness of breath. You faint or keep feeling like you are going to faint. You have a very bad headache. Your fever or chills get worse. These symptoms may be an emergency. Get help right away. Call your local emergency services (911 in the [place] Do not wait to see if the symptoms will go away. Do not drive yourself to the hospital. Summary Acute bronchitis is when air tubes in the lungs (bronchi) suddenly get swollen. In adults, acute bronchitis usually goes away within 2 weeks. Drink more fluids. This will help thin your mucus so it   is easier to cough up. Take over-the-counter and prescription medicines only as told by your doctor. Contact a doctor if your symptoms do not improve after 2 weeks of treatment. This information is not intended to replace advice given to you by your health care provider. Make sure you discuss any questions you have with your health care provider. Document Revised: 07/02/2020 Document Reviewed: 07/02/2020 Elsevier  Patient Education  2023 [employer]vier Inc[employer]  

## 2022-05-25 NOTE — Assessment & Plan Note (Signed)
Clinically stable without red flag signs or symptoms No signs of pneumonia However patient has viral infection now with secondary bacterial infection.  Progressively getting worse. Will benefit from daily azithromycin for 5 days Advised to rest and stay well-hydrated. Advised to contact the office if no better or worse during the next several days.

## 2022-05-25 NOTE — Progress Notes (Signed)
Michele Bennett 62 y.o.   Chief Complaint  Patient presents with   Acute Visit    Sore throat, earache, congestion, cough x 3 days , chills  COVID neg yesterday     HISTORY OF PRESENT ILLNESS: Acute problem visit today. This is a 62 y.o. female complaining of chest congestion and productive cough with chills that started 5 days ago Progressively getting worse. Denies difficulty breathing, chest pain, nausea or vomiting, abdominal pain, or diarrhea No other associated symptoms No other complaints or medical concerns today.  HPI   Prior to Admission medications   Medication Sig Start Date End Date Taking? Authorizing Provider  acetaminophen (TYLENOL) 500 MG tablet Take by mouth as needed.  11/21/18  Yes [provider]  Cholecalciferol (VITAMIN D3) 2000 UNITS TABS Take 5,000 Units by mouth daily.   Yes [provider]  diclofenac sodium (VOLTAREN) 1 % GEL Apply 4 g topically 4 (four) times daily. Patient taking differently: Apply 4 g topically as needed. 01/02/15  Yes Hendricks Limes, MD  estradiol (DOTTI) 0.075 MG/24HR PLACE ONE PATCH ONTO THE SKIN TWO TIMES A WEEK 06/24/21  Yes Nunzio Cobbs, MD  fexofenadine (ALLEGRA) 180 MG tablet Take 180 mg by mouth as needed.   Yes [provider]  magnesium oxide (MAG-OX) 400 MG tablet Take 400 mg by mouth daily.   Yes [provider]  meloxicam (MOBIC) 15 MG tablet Take 15 mg by mouth daily.   Yes [provider]  Misc Natural Products (TART CHERRY ADVANCED PO) Take by mouth.   Yes [provider]  progesterone (PROMETRIUM) 100 MG capsule Take 1 capsule (100 mg total) by mouth at bedtime. 06/24/21  Yes Nunzio Cobbs, MD  triamcinolone (KENALOG) 0.1 % paste Use as directed 1 Application in the mouth or throat 2 (two) times daily. 12/01/21  Yes Burns, Claudina Lick, MD  TURMERIC PO Take by mouth daily.   Yes [provider]  Zinc 25 MG TABS Take 1 tablet by  mouth daily. '20MG'$    Yes [provider]  gabapentin (NEURONTIN) 300 MG capsule  05/29/21   [provider]    Allergies  Allergen Reactions   Celebrex [Celecoxib] Other (See Comments)    Heart races    Patient Active Problem List   Diagnosis Date Noted   Chronic back pain 02/25/2022   Sleep difficulties 07/21/2021   Right flank pain 01/21/2021   Tingling of both feet 01/05/2021   Multiple thyroid nodules 07/10/2020   History of SCC (squamous cell carcinoma) of skin 05/16/2020   Thyroid nodule-benign-no follow-up needed 12/12/2019   Spondylosis without myelopathy or radiculopathy, lumbar region 03/14/2019   Chest tightness 10/05/2018   Primary osteoarthritis of both knees 12/09/2016   Plantar fasciitis 12/09/2016   Primary osteoarthritis of both feet 12/09/2016   Primary osteoarthritis of both hands 12/09/2016   DDD (degenerative disc disease), lumbar 12/09/2016   Lateral epicondylitis, right elbow 12/09/2016   Lumbar radiculopathy 02/25/2016   DDD (degenerative disc disease), cervical 10/19/2012   PVCs (premature ventricular contractions) 03/12/2011   Sebaceous cyst 10/14/2010   Acute sinus infection 03/10/2010   Vitamin D deficiency 01/01/2008   DJD (degenerative joint disease), multiple sites 01/01/2008    Past Medical History:  Diagnosis Date   Allergy    Arthritis    Cancer ([place]) 2019   basal cell on chest and back   Cyst    hip; probable epidemoid inclusion cyst; resolution with  antibiotics   Right knee meniscal tear  07/2011   Dr Hal Morales, Ortho; S/P cortisone injection   Spinal stenosis at L4-L5 level 2017   bilateral   Squamous cell carcinoma of leg, left 2022   Tear of left acetabular labrum 2006 ?   Dr Sallyanne Havers   Thyroid nodule    dx by Dr. Maxie Better   Vitamin D deficiency     Past Surgical History:  Procedure Laterality Date   ANKLE SURGERY  1989   Post fracture    DILATION AND CURETTAGE OF UTERUS  1988   FOOT SURGERY     left- bone  spurs, Dr Mayer Camel   HAND SURGERY     Thumb surgery x 2   KNEE ARTHROPLASTY     KNEE ARTHROSCOPY Right 2013   LAPAROTOMY  1988   For Dysmenorrhea   REPLACEMENT TOTAL KNEE Right 11/2018   REPLACEMENT TOTAL KNEE Left 04/27/2021    Social History   Socioeconomic History   Marital status: Single    Spouse name: Not on file   Number of children: 0   Years of education: Not on file   Highest education level: Not on file  Occupational History   Occupation: Naval architect  Tobacco Use   Smoking status: Never   Smokeless tobacco: Never  Vaping Use   Vaping Use: Never used  Substance and Sexual Activity   Alcohol use: Yes    Comment: 2 glasses of wine or beer a week   Drug use: No   Sexual activity: Not Currently    Partners: Male  Other Topics Concern   Not on file  Social History Narrative   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  Walks dog, gardening, PT for back   Social Determinants of Health   Financial Resource Strain: Not on file  Food Insecurity: No Food Insecurity (01/29/2022)   Hunger Vital Sign    Worried About Running Out of Food in the Last Year: Never true    Ran Out of Food in the Last Year: Never true  Transportation Needs: No Transportation Needs (01/29/2022)   PRAPARE - Hydrologist (Medical): No    Lack of Transportation (Non-Medical): No  Physical Activity: Not on file  Stress: Not on file  Social Connections: Not on file  Intimate Partner Violence: Not on file    Family History  Problem Relation Age of Onset   Parkinsonism Father    Colon polyps Father        No colon cancer   Transient ischemic attack Father    Breast cancer Mother 56   Coronary artery disease Mother        ? radiation related   Arthritis Mother        DJD   Kidney disease Mother    Cancer Mother    Hypertension Mother    Hyperlipidemia Mother    Thyroid disease Mother    Arthritis Maternal Grandmother        DJD   Diabetes  Maternal Grandfather    Heart attack Neg Hx      Review of Systems  Constitutional:  Positive for chills.  HENT:  Positive for congestion.   Respiratory:  Positive for cough and sputum production. Negative for hemoptysis, shortness of breath and wheezing.   Cardiovascular: Negative.  Negative for chest pain and palpitations.  Gastrointestinal:  Negative for abdominal pain, diarrhea, nausea and vomiting.  Musculoskeletal:  Negative for joint pain.  Skin:  Negative  for rash.  Neurological:  Positive for weakness. Negative for dizziness and headaches.  All other systems reviewed and are negative.  Today's Vitals   05/25/22 0925  BP: 126/74  Pulse: 73  Temp: 98.5 F (36.9 C)  TempSrc: Oral  SpO2: 98%  Weight: 149 lb 6 oz (67.8 kg)  Height: '5\' 7"'$  (1.702 m)   Body mass index is 23.4 kg/m.   Physical Exam Vitals reviewed.  Constitutional:      Appearance: Normal appearance.  HENT:     Head: Normocephalic.     Right Ear: Tympanic membrane, ear canal and external ear normal.     Left Ear: Tympanic membrane, ear canal and external ear normal.     Mouth/Throat:     Mouth: Mucous membranes are moist.     Pharynx: Oropharynx is clear.  Eyes:     Extraocular Movements: Extraocular movements intact.     Conjunctiva/sclera: Conjunctivae normal.     Pupils: Pupils are equal, round, and reactive to light.  Cardiovascular:     Rate and Rhythm: Normal rate and regular rhythm.     Pulses: Normal pulses.     Heart sounds: Normal heart sounds.  Pulmonary:     Effort: Pulmonary effort is normal.     Breath sounds: Normal breath sounds.  Musculoskeletal:     Cervical back: No tenderness.  Lymphadenopathy:     Cervical: No cervical adenopathy.  Skin:    General: Skin is warm and dry.  Neurological:     General: No focal deficit present.     Mental Status: She is alert and oriented to person, place, and time.  Psychiatric:        Mood and Affect: Mood normal.        Behavior:  Behavior normal.    Results for orders placed or performed in visit on 05/25/22 (from the past 24 hour(s))  POCT Influenza A/B     Status: Normal   Collection Time: 05/25/22 12:52 PM  Result Value Ref Range   Influenza A, POC Negative Negative   Influenza B, POC Negative Negative  POC COVID-19     Status: Normal   Collection Time: 05/25/22 12:52 PM  Result Value Ref Range   SARS Coronavirus 2 Ag Negative Negative     ASSESSMENT & PLAN: A total of 32 minutes was spent with the patient and counseling/coordination of care regarding preparing for this visit, review of most recent office visit notes, review of multiple chronic medical problems under management, review of all medications, diagnosis of lower respiratory infection and need for antibiotics, cough, treatment, prognosis, documentation, and need to follow-up if no better or worse during the next several days.  Problem List Items Addressed This Visit       Respiratory   Lower respiratory infection - Primary    Clinically stable without red flag signs or symptoms No signs of pneumonia However patient has viral infection now with secondary bacterial infection.  Progressively getting worse. Will benefit from daily azithromycin for 5 days Advised to rest and stay well-hydrated. Advised to contact the office if no better or worse during the next several days.      Relevant Medications   azithromycin (ZITHROMAX) 250 MG tablet     Other   Acute cough    Continue over-the-counter Mucinex DM Hycodan syrup as needed Recommend to use cough drops and stay well-hydrated Cough management discussed.      Relevant Medications   HYDROcodone bit-homatropine (HYCODAN) 5-1.5 MG/5ML syrup  Other Visit Diagnoses     Flu-like symptoms       Relevant Orders   POCT Influenza A/B   POC COVID-19      Patient Instructions  Acute Bronchitis, Adult  Acute bronchitis is when air tubes in the lungs (bronchi) suddenly get swollen. The  condition can make it hard for you to breathe. In adults, acute bronchitis usually goes away within 2 weeks. A cough caused by bronchitis may last up to 3 weeks. Smoking, allergies, and asthma can make the condition worse. What are the causes? Germs that cause cold and flu (viruses). The most common cause of this condition is the virus that causes the common cold. Bacteria. Substances that bother (irritate) the lungs, including: Smoke from cigarettes and other types of tobacco. Dust and pollen. Fumes from chemicals, gases, or burned fuel. Indoor or outdoor air pollution. What increases the risk? A weak body's defense system. This is also called the immune system. Any condition that affects your lungs and breathing, such as asthma. What are the signs or symptoms? A cough. Coughing up clear, yellow, or green mucus. Making high-pitched whistling sounds when you breathe, most often when you breathe out (wheezing). Runny or stuffy nose. Having too much mucus in your lungs (chest congestion). Shortness of breath. Body aches. A sore throat. How is this treated? Acute bronchitis may go away over time without treatment. Your doctor may tell you to: Drink more fluids. This will help thin your mucus so it is easier to cough up. Use a device that gets medicine into your lungs (inhaler). Use a vaporizer or a humidifier. These are machines that add water to the air. This helps with coughing and poor breathing. Take a medicine that thins mucus and helps clear it from your lungs. Take a medicine that prevents or stops coughing. It is not common to take an antibiotic medicine for this condition. Follow these instructions at home:  Take over-the-counter and prescription medicines only as told by your doctor. Use an inhaler, vaporizer, or humidifier as told by your doctor. Take two teaspoons (10 mL) of honey at bedtime. This helps lessen your coughing at night. Drink enough fluid to keep your pee  (urine) pale yellow. Do not smoke or use any products that contain nicotine or tobacco. If you need help quitting, ask your doctor. Get a lot of rest. Return to your normal activities when your doctor says that it is safe. Keep all follow-up visits. How is this prevented?  Wash your hands often with soap and water for at least 20 seconds. If you cannot use soap and water, use hand sanitizer. Avoid contact with people who have cold symptoms. Try not to touch your mouth, nose, or eyes with your hands. Avoid breathing in smoke or chemical fumes. Make sure to get the flu shot every year. Contact a doctor if: Your symptoms do not get better in 2 weeks. You have trouble coughing up the mucus. Your cough keeps you awake at night. You have a fever. Get help right away if: You cough up blood. You have chest pain. You have very bad shortness of breath. You faint or keep feeling like you are going to faint. You have a very bad headache. Your fever or chills get worse. These symptoms may be an emergency. Get help right away. Call your local emergency services (911 in the [place] Do not wait to see if the symptoms will go away. Do not drive yourself to the hospital. Summary Acute  bronchitis is when air tubes in the lungs (bronchi) suddenly get swollen. In adults, acute bronchitis usually goes away within 2 weeks. Drink more fluids. This will help thin your mucus so it is easier to cough up. Take over-the-counter and prescription medicines only as told by your doctor. Contact a doctor if your symptoms do not improve after 2 weeks of treatment. This information is not intended to replace advice given to you by your health care provider. Make sure you discuss any questions you have with your health care provider. Document Revised: 07/02/2020 Document Reviewed: 07/02/2020 Elsevier Patient Education  [place], MD [facility]

## 2022-06-16 NOTE — Progress Notes (Deleted)
62 y.o. G0P0000 Single {Race/ethnicity:17218} female here for annual exam.    PCP:     Patient's last menstrual period was 02/23/2018 (approximate).           Sexually active: {yes no:314532}  The current method of family planning is post menopausal status.    Exercising: {yes no:314532}  {types:19826} Smoker:  no  Health Maintenance: Pap:  04/22/17 neg: HR HPV neg History of abnormal Pap:  no MMG:  11/17/21 Breast Density Cat C, BI-RADS CAT 4 suspicious Colonoscopy:  10/06/21 BMD:   12/12/19  Result  normal TDaP:  12/16/15 Gardasil:   no HIV: 05/31/12 NR Hep C: 05/31/12 neg Screening Labs:  Hb today: ***, Urine today: ***   reports that she has never smoked. She has never used smokeless tobacco. She reports current alcohol use. She reports that she does not use drugs.  Past Medical History:  Diagnosis Date   Allergy    Arthritis    Cancer ([place]) 2019   basal cell on chest and back   Cyst    hip; probable epidemoid inclusion cyst; resolution with antibiotics   Right knee meniscal tear  07/2011   Dr Hal Morales, Ortho; S/P cortisone injection   Spinal stenosis at L4-L5 level 2017   bilateral   Squamous cell carcinoma of leg, left 2022   Tear of left acetabular labrum 2006 ?   Dr Sallyanne Havers   Thyroid nodule    dx by Dr. Maxie Better   Vitamin D deficiency     Past Surgical History:  Procedure Laterality Date   ANKLE SURGERY  1989   Post fracture    DILATION AND CURETTAGE OF UTERUS  1988   FOOT SURGERY     left- bone spurs, Dr Mayer Camel   HAND SURGERY     Thumb surgery x 2   KNEE ARTHROPLASTY     KNEE ARTHROSCOPY Right 2013   LAPAROTOMY  1988   For Dysmenorrhea   REPLACEMENT TOTAL KNEE Right 11/2018   REPLACEMENT TOTAL KNEE Left 04/27/2021    Current Outpatient Medications  Medication Sig Dispense Refill   acetaminophen (TYLENOL) 500 MG tablet Take by mouth as needed.      azithromycin (ZITHROMAX) 250 MG tablet Sig as indicated 6 tablet 0   Cholecalciferol (VITAMIN D3) 2000 UNITS  TABS Take 5,000 Units by mouth daily.     diclofenac sodium (VOLTAREN) 1 % GEL Apply 4 g topically 4 (four) times daily. (Patient taking differently: Apply 4 g topically as needed.) 100 g 3   estradiol (DOTTI) 0.075 MG/24HR PLACE ONE PATCH ONTO THE SKIN TWO TIMES A WEEK 24 patch 3   fexofenadine (ALLEGRA) 180 MG tablet Take 180 mg by mouth as needed.     gabapentin (NEURONTIN) 300 MG capsule  (Patient not taking: Reported on 05/25/2022)     HYDROcodone bit-homatropine (HYCODAN) 5-1.5 MG/5ML syrup Take 5 mLs by mouth at bedtime as needed for cough. 120 mL 0   magnesium oxide (MAG-OX) 400 MG tablet Take 400 mg by mouth daily.     meloxicam (MOBIC) 15 MG tablet Take 15 mg by mouth daily.     Misc Natural Products (TART CHERRY ADVANCED PO) Take by mouth.     progesterone (PROMETRIUM) 100 MG capsule Take 1 capsule (100 mg total) by mouth at bedtime. 90 capsule 3   triamcinolone (KENALOG) 0.1 % paste Use as directed 1 Application in the mouth or throat 2 (two) times daily. 5 g 12   TURMERIC PO Take by mouth  daily.     Zinc 25 MG TABS Take 1 tablet by mouth daily. 20MG      No current facility-administered medications for this visit.    Family History  Problem Relation Age of Onset   Parkinsonism Father    Colon polyps Father        No colon cancer   Transient ischemic attack Father    Breast cancer Mother 43   Coronary artery disease Mother        ? radiation related   Arthritis Mother        DJD   Kidney disease Mother    Cancer Mother    Hypertension Mother    Hyperlipidemia Mother    Thyroid disease Mother    Arthritis Maternal Grandmother        DJD   Diabetes Maternal Grandfather    Heart attack Neg Hx     Review of Systems  Exam:   LMP 02/23/2018 (Approximate)     General appearance: alert, cooperative and appears stated age Head: normocephalic, without obvious abnormality, atraumatic Neck: no adenopathy, supple, symmetrical, trachea midline and thyroid normal to inspection  and palpation Lungs: clear to auscultation bilaterally Breasts: normal appearance, no masses or tenderness, No nipple retraction or dimpling, No nipple discharge or bleeding, No axillary adenopathy Heart: regular rate and rhythm Abdomen: soft, non-tender; no masses, no organomegaly Extremities: extremities normal, atraumatic, no cyanosis or edema Skin: skin color, texture, turgor normal. No rashes or lesions Lymph nodes: cervical, supraclavicular, and axillary nodes normal. Neurologic: grossly normal  Pelvic: External genitalia:  no lesions              No abnormal inguinal nodes palpated.              Urethra:  normal appearing urethra with no masses, tenderness or lesions              Bartholins and Skenes: normal                 Vagina: normal appearing vagina with normal color and discharge, no lesions              Cervix: no lesions              Pap taken: {yes no:314532} Bimanual Exam:  Uterus:  normal size, contour, position, consistency, mobility, non-tender              Adnexa: no mass, fullness, tenderness              Rectal exam: {yes no:314532}.  Confirms.              Anus:  normal sphincter tone, no lesions  Chaperone was present for exam:  ***  Assessment:   Well woman visit with gynecologic exam.   Plan: Mammogram screening discussed. Self breast awareness reviewed. Pap and HR HPV as above. Guidelines for Calcium, Vitamin D, regular exercise program including cardiovascular and weight bearing exercise.   Follow up annually and prn.   Additional counseling given.  {yes [id number]. _______ minutes face to face time of which over 50% was spent in counseling.    After visit summary provided.

## 2022-06-30 ENCOUNTER — Ambulatory Visit: Payer: BC Managed Care – PPO | Admitting: Obstetrics and Gynecology

## 2022-07-19 ENCOUNTER — Other Ambulatory Visit: Payer: Self-pay | Admitting: Obstetrics and Gynecology

## 2022-07-19 NOTE — Telephone Encounter (Signed)
Last AEX 06/24/2021--scheduled for 09/20/2022. Last mammo 10/07/2021- birads 0, B/L Dx 11/17/2021-birads 4, Lt Breast Bx/Clip 11/24/2021-fibrocystic changes, no malignancy.   Rx pend.

## 2022-07-23 ENCOUNTER — Other Ambulatory Visit: Payer: Self-pay | Admitting: Obstetrics and Gynecology

## 2022-07-26 NOTE — Telephone Encounter (Signed)
Med refill request: progesterone 100 mg caps daily at HS Last AEX: 06/24/21 -BS Next AEX: 09/20/22 -BS Last MMG (if hormonal med) 10/07/21 screening, 11/17/21 Bil Dx, 11/24/21 L BR Bx, resume annual screening 09/2022  Rx pended #[id number]/[id number]  Refill authorized: Please Advise?

## 2022-09-02 ENCOUNTER — Ambulatory Visit: Payer: BC Managed Care – PPO | Admitting: Obstetrics and Gynecology

## 2022-09-02 NOTE — Patient Instructions (Addendum)

## 2022-09-02 NOTE — Progress Notes (Signed)
Subjective:    Patient ID: Michele Bennett, female    DOB: 1960-10-17, 62 y.o.   MRN: [id number]      HPI Michele Bennett is here for a Physical exam and her chronic medical problems.   Cervical spine and lumbar spine disease has gotten worse.  She can not sit for long with her back.  She having increased neck pain - it does keep her up at night.  She did get updated MRIs by orthopedics of her cervical spine and lumbar spine-she brought copies today.  Medications and allergies reviewed with patient and updated if appropriate.  Current Outpatient Medications on File Prior to Visit  Medication Sig Dispense Refill   acetaminophen (TYLENOL) 500 MG tablet Take by mouth as needed.      Cholecalciferol (VITAMIN D3) 2000 UNITS TABS Take 5,000 Units by mouth daily.     diclofenac sodium (VOLTAREN) 1 % GEL Apply 4 g topically 4 (four) times daily. (Patient taking differently: Apply 4 g topically as needed.) 100 g 3   DOTTI 0.075 MG/24HR Place 1 patch onto the skin 2 times a week 24 patch 0   fexofenadine (ALLEGRA) 180 MG tablet Take 180 mg by mouth as needed.     gabapentin (NEURONTIN) 100 MG capsule Take 100 mg by mouth at bedtime.     magnesium oxide (MAG-OX) 400 MG tablet Take 400 mg by mouth daily.     meloxicam (MOBIC) 15 MG tablet Take 15 mg by mouth daily.     Misc Natural Products (TART CHERRY ADVANCED PO) Take by mouth.     progesterone (PROMETRIUM) 100 MG capsule TAKE ONE CAPSULE BY MOUTH DAILY AT BEDTIME 90 capsule 0   triamcinolone (KENALOG) 0.1 % paste Use as directed 1 Application in the mouth or throat 2 (two) times daily. 5 g 12   TURMERIC PO Take by mouth daily.     Zinc 25 MG TABS Take 1 tablet by mouth daily. 20MG      No current facility-administered medications on file prior to visit.    Review of Systems  Constitutional:  Negative for fever.  Eyes:  Negative for visual disturbance.  Respiratory:  Negative for cough, shortness of breath and wheezing.    Cardiovascular:  Negative for chest pain, palpitations and leg swelling.  Gastrointestinal:  Negative for abdominal pain, blood in stool, constipation and diarrhea.       No gerd  Genitourinary:  Negative for dysuria.  Musculoskeletal:  Positive for back pain and neck pain. Negative for arthralgias.  Skin:  Negative for rash.  Neurological:  Negative for light-headedness and headaches.  Psychiatric/Behavioral:  Negative for dysphoric mood. The patient is not nervous/anxious.        Objective:   Vitals:   09/03/22 1315  BP: 122/70  Pulse: 68  Temp: 98 F (36.7 C)  SpO2: 99%   Filed Weights   09/03/22 1315  Weight: 149 lb 9.6 oz (67.9 kg)   Body mass index is 23.43 kg/m.  BP Readings from Last 3 Encounters:  09/03/22 122/70  05/25/22 126/74  02/25/22 136/82    Wt Readings from Last 3 Encounters:  09/03/22 149 lb 9.6 oz (67.9 kg)  05/25/22 149 lb 6 oz (67.8 kg)  02/25/22 150 lb (68 kg)       Physical Exam Constitutional: She appears well-developed and well-nourished. No distress.  HENT:  Head: Normocephalic and atraumatic.  Right Ear: External ear normal. Normal ear canal and TM Left Ear: External ear normal.  Normal ear canal and TM Mouth/Throat: Oropharynx is clear and moist.  Eyes: Conjunctivae normal.  Neck: Neck supple. No tracheal deviation present. No thyromegaly present.  No carotid bruit  Cardiovascular: Normal rate, regular rhythm and normal heart sounds.   No murmur heard.  No edema. Pulmonary/Chest: Effort normal and breath sounds normal. No respiratory distress. She has no wheezes. She has no rales.  Breast: deferred   Abdominal: Soft. She exhibits no distension. There is no tenderness.  Lymphadenopathy: She has no cervical adenopathy.  Skin: Skin is warm and dry. She is not diaphoretic.  Psychiatric: She has a normal mood and affect. Her behavior is normal.     Lab Results  Component Value Date   WBC 5.0 10/16/2021   HGB 13.5 10/16/2021    HCT 40.0 10/16/2021   PLT 284 10/16/2021   GLUCOSE 92 10/16/2021   CHOL 177 05/23/2020   TRIG 59.0 05/23/2020   HDL 74.30 05/23/2020   LDLCALC 91 05/23/2020   ALT 14 10/16/2021   AST 18 10/16/2021   NA 139 10/16/2021   K 4.3 10/16/2021   CL 104 10/16/2021   CREATININE 1.00 10/16/2021   BUN 14 10/16/2021   CO2 29 10/16/2021   TSH 0.79 05/23/2020   HGBA1C 5.5 01/05/2021         Assessment & Plan:   Physical exam: Screening blood work  ordered Exercise  walking the dog Weight  normal Substance abuse  none   Reviewed recommended immunizations.   Health Maintenance  Topic Date Due   PAP SMEAR-Modifier  04/22/2022   COVID-19 Vaccine (3 - Pfizer risk series) 09/19/2022 (Originally 07/13/2019)   INFLUENZA VACCINE  10/14/2022   MAMMOGRAM  11/18/2022   DTaP/Tdap/Td (3 - Td or Tdap) 12/15/2025   Colonoscopy  10/06/2028   Hepatitis C Screening  Completed   HIV Screening  Completed   Zoster Vaccines- Shingrix  Completed   HPV VACCINES  Aged Out          See Problem List for Assessment and Plan of chronic medical problems.

## 2022-09-03 ENCOUNTER — Ambulatory Visit (INDEPENDENT_AMBULATORY_CARE_PROVIDER_SITE_OTHER): Payer: BC Managed Care – PPO | Admitting: Internal Medicine

## 2022-09-03 ENCOUNTER — Encounter: Payer: Self-pay | Admitting: Internal Medicine

## 2022-09-03 VITALS — BP 122/70 | HR 68 | Temp 98.0°F | Ht 67.0 in | Wt 149.6 lb

## 2022-09-03 DIAGNOSIS — Z0000 Encounter for general adult medical examination without abnormal findings: Secondary | ICD-10-CM

## 2022-09-03 DIAGNOSIS — I493 Ventricular premature depolarization: Secondary | ICD-10-CM | POA: Diagnosis not present

## 2022-09-03 DIAGNOSIS — E559 Vitamin D deficiency, unspecified: Secondary | ICD-10-CM | POA: Diagnosis not present

## 2022-09-03 DIAGNOSIS — E041 Nontoxic single thyroid nodule: Secondary | ICD-10-CM

## 2022-09-03 NOTE — Assessment & Plan Note (Addendum)
Chronic Tsh, lipids

## 2022-09-03 NOTE — Assessment & Plan Note (Signed)
Chronic Taking vitamin D daily Check vitamin D level  

## 2022-09-03 NOTE — Assessment & Plan Note (Addendum)
Chronic Aggravated by caffeine  - tries to avoid drinking too much asymptomatic Cbc,cmp,tsh

## 2022-09-06 NOTE — Progress Notes (Deleted)
62 y.o. G0P0000 Single Caucasian female here for annual exam.    PCP:     Patient's last menstrual period was 02/23/2018 (approximate).           Sexually active: {yes no:314532}  The current method of family planning is post menopausal status.    Exercising: {yes no:314532}  {types:19826} Smoker:  no  Health Maintenance: Pap:  04/22/17 neg:HR HPV neg History of abnormal Pap:  no MMG:  11/17/21 Breast Density Cat C, BI-RADS CAT 4 sus Colonoscopy:  10/06/21 BMD:   12/12/19  Result  WNL TDaP:  12/16/15 Gardasil:   no HIV: 05/31/12 NR Hep C: 05/31/12 neg Screening Labs:  Hb today: ***, Urine today: ***   reports that she has never smoked. She has never used smokeless tobacco. She reports current alcohol use. She reports that she does not use drugs.  Past Medical History:  Diagnosis Date   Allergy    Arthritis    Cancer (HCC) 2019   basal cell on chest and back   Cyst    hip; probable epidemoid inclusion cyst; resolution with antibiotics   Right knee meniscal tear  07/2011   Dr Elita Quick, Ortho; S/P cortisone injection   Spinal stenosis at L4-L5 level 2017   bilateral   Squamous cell carcinoma of leg, left 2022   Tear of left acetabular labrum 2006 ?   Dr Julius Bowels   Thyroid nodule    dx by Dr. Jillyn Hidden   Vitamin D deficiency     Past Surgical History:  Procedure Laterality Date   ANKLE SURGERY  1989   Post fracture    DILATION AND CURETTAGE OF UTERUS  1988   FOOT SURGERY     left- bone spurs, Dr Turner Daniels   HAND SURGERY     Thumb surgery x 2   KNEE ARTHROPLASTY     KNEE ARTHROSCOPY Right 2013   LAPAROTOMY  1988   For Dysmenorrhea   REPLACEMENT TOTAL KNEE Right 11/2018   REPLACEMENT TOTAL KNEE Left 04/27/2021    Current Outpatient Medications  Medication Sig Dispense Refill   acetaminophen (TYLENOL) 500 MG tablet Take by mouth as needed.      Cholecalciferol (VITAMIN D3) 2000 UNITS TABS Take 5,000 Units by mouth daily.     diclofenac sodium (VOLTAREN) 1 % GEL Apply 4 g  topically 4 (four) times daily. (Patient taking differently: Apply 4 g topically as needed.) 100 g 3   DOTTI 0.075 MG/24HR Place 1 patch onto the skin 2 times a week 24 patch 0   fexofenadine (ALLEGRA) 180 MG tablet Take 180 mg by mouth as needed.     gabapentin (NEURONTIN) 100 MG capsule Take 100 mg by mouth at bedtime.     magnesium oxide (MAG-OX) 400 MG tablet Take 400 mg by mouth daily.     meloxicam (MOBIC) 15 MG tablet Take 15 mg by mouth daily.     Misc Natural Products (TART CHERRY ADVANCED PO) Take by mouth.     progesterone (PROMETRIUM) 100 MG capsule TAKE ONE CAPSULE BY MOUTH DAILY AT BEDTIME 90 capsule 0   triamcinolone (KENALOG) 0.1 % paste Use as directed 1 Application in the mouth or throat 2 (two) times daily. 5 g 12   TURMERIC PO Take by mouth daily.     Zinc 25 MG TABS Take 1 tablet by mouth daily. 20MG      No current facility-administered medications for this visit.    Family History  Problem Relation Age of Onset  Parkinsonism Father    Colon polyps Father        No colon cancer   Transient ischemic attack Father    Breast cancer Mother 63   Coronary artery disease Mother        ? radiation related   Arthritis Mother        DJD   Kidney disease Mother    Cancer Mother    Hypertension Mother    Hyperlipidemia Mother    Thyroid disease Mother    Arthritis Maternal Grandmother        DJD   Diabetes Maternal Grandfather    Heart attack Neg Hx     Review of Systems  Exam:   LMP 02/23/2018 (Approximate)     General appearance: alert, cooperative and appears stated age Head: normocephalic, without obvious abnormality, atraumatic Neck: no adenopathy, supple, symmetrical, trachea midline and thyroid normal to inspection and palpation Lungs: clear to auscultation bilaterally Breasts: normal appearance, no masses or tenderness, No nipple retraction or dimpling, No nipple discharge or bleeding, No axillary adenopathy Heart: regular rate and rhythm Abdomen:  soft, non-tender; no masses, no organomegaly Extremities: extremities normal, atraumatic, no cyanosis or edema Skin: skin color, texture, turgor normal. No rashes or lesions Lymph nodes: cervical, supraclavicular, and axillary nodes normal. Neurologic: grossly normal  Pelvic: External genitalia:  no lesions              No abnormal inguinal nodes palpated.              Urethra:  normal appearing urethra with no masses, tenderness or lesions              Bartholins and Skenes: normal                 Vagina: normal appearing vagina with normal color and discharge, no lesions              Cervix: no lesions              Pap taken: {yes no:314532} Bimanual Exam:  Uterus:  normal size, contour, position, consistency, mobility, non-tender              Adnexa: no mass, fullness, tenderness              Rectal exam: {yes no:314532}.  Confirms.              Anus:  normal sphincter tone, no lesions  Chaperone was present for exam:  ***  Assessment:   Well woman visit with gynecologic exam.   Plan: Mammogram screening discussed. Self breast awareness reviewed. Pap and HR HPV as above. Guidelines for Calcium, Vitamin D, regular exercise program including cardiovascular and weight bearing exercise.   Follow up annually and prn.   Additional counseling given.  {yes [id number]. _______ minutes face to face time of which over 50% was spent in counseling.    After visit summary provided.

## 2022-09-08 ENCOUNTER — Other Ambulatory Visit: Payer: Self-pay | Admitting: Obstetrics and Gynecology

## 2022-09-08 NOTE — Telephone Encounter (Signed)
Med refill request: progesterone  Last AEX: 06/24/21 -BS Next AEX: 09/20/22 -BS Last MMG (if hormonal med) 10/07/21 screening, 11/17/21 Bil Dx, 11/24/21 L BR Bx, resume annual screening 09/2022   Rx pended #90/0RF   Refill authorized: Please Advise?

## 2022-09-09 ENCOUNTER — Other Ambulatory Visit (INDEPENDENT_AMBULATORY_CARE_PROVIDER_SITE_OTHER): Payer: BC Managed Care – PPO

## 2022-09-09 DIAGNOSIS — E041 Nontoxic single thyroid nodule: Secondary | ICD-10-CM

## 2022-09-09 DIAGNOSIS — I493 Ventricular premature depolarization: Secondary | ICD-10-CM

## 2022-09-09 DIAGNOSIS — D649 Anemia, unspecified: Secondary | ICD-10-CM

## 2022-09-09 DIAGNOSIS — E559 Vitamin D deficiency, unspecified: Secondary | ICD-10-CM

## 2022-09-09 LAB — COMPREHENSIVE METABOLIC PANEL
ALT: 18 U/L (ref 0–35)
AST: 22 U/L (ref 0–37)
Albumin: 4.1 g/dL (ref 3.5–5.2)
Alkaline Phosphatase: 56 U/L (ref 39–117)
BUN: 15 mg/dL (ref 6–23)
CO2: 30 mEq/L (ref 19–32)
Calcium: 9.7 mg/dL (ref 8.4–10.5)
Chloride: 104 mEq/L (ref 96–112)
Creatinine, Ser: 1 mg/dL (ref 0.40–1.20)
GFR: 61.12 mL/min (ref >60.00)
Glucose, Bld: 88 mg/dL (ref 70–99)
Potassium: 4.1 mEq/L (ref 3.5–5.1)
Sodium: 140 mEq/L (ref 135–145)
Total Bilirubin: 0.7 mg/dL (ref 0.2–1.2)
Total Protein: 6.6 g/dL (ref 6.0–8.3)

## 2022-09-09 LAB — VITAMIN D 25 HYDROXY (VIT D DEFICIENCY, FRACTURES): VITD: 54.28 ng/mL (ref 30.00–100.00)

## 2022-09-09 LAB — CBC WITH DIFFERENTIAL/PLATELET
Basophils Absolute: 0.1 10*3/uL (ref 0.0–0.1)
Basophils Relative: 1.9 % (ref 0.0–3.0)
Eosinophils Absolute: 0.1 10*3/uL (ref 0.0–0.7)
Eosinophils Relative: 2.6 % (ref 0.0–5.0)
HCT: 35.6 % — ABNORMAL LOW (ref 36.0–46.0)
Hemoglobin: 11.7 g/dL — ABNORMAL LOW (ref 12.0–15.0)
Lymphocytes Relative: 36.6 % (ref 12.0–46.0)
Lymphs Abs: 1.5 10*3/uL (ref 0.7–4.0)
MCHC: 32.9 g/dL (ref 30.0–36.0)
MCV: 94.7 fl (ref 78.0–100.0)
Monocytes Absolute: 0.4 10*3/uL (ref 0.1–1.0)
Monocytes Relative: 9.1 % (ref 3.0–12.0)
Neutro Abs: 2 10*3/uL (ref 1.4–7.7)
Neutrophils Relative %: 49.8 % (ref 43.0–77.0)
Platelets: 284 10*3/uL (ref 150.0–400.0)
RBC: 3.77 Mil/uL — ABNORMAL LOW (ref 3.87–5.11)
RDW: 14.2 % (ref 11.5–15.5)
WBC: 4.1 10*3/uL (ref 4.0–10.5)

## 2022-09-09 LAB — LIPID PANEL
Cholesterol: 176 mg/dL (ref 0–200)
HDL: 70.3 mg/dL (ref >39.00)
LDL Cholesterol: 92 mg/dL (ref 0–99)
NonHDL: 105.76
Total CHOL/HDL Ratio: 3
Triglycerides: 67 mg/dL (ref 0.0–149.0)
VLDL: 13.4 mg/dL (ref 0.0–40.0)

## 2022-09-09 LAB — TSH: TSH: 1.06 u[IU]/mL (ref 0.35–5.50)

## 2022-09-20 ENCOUNTER — Ambulatory Visit: Payer: BC Managed Care – PPO | Admitting: Obstetrics and Gynecology

## 2022-10-01 ENCOUNTER — Other Ambulatory Visit: Payer: Self-pay | Admitting: Obstetrics and Gynecology

## 2022-10-04 NOTE — Telephone Encounter (Signed)
Med refill request: Dotti Last AEX: 06/24/21 Next AEX: 10/25/22 Last MMG (if hormonal med) 11/17/21 Refill authorized: Please Advise, #24 patch, 0 RF

## 2022-10-18 ENCOUNTER — Encounter: Payer: Self-pay | Admitting: Internal Medicine

## 2022-10-20 ENCOUNTER — Ambulatory Visit: Payer: BC Managed Care – PPO | Admitting: Rheumatology

## 2022-10-25 ENCOUNTER — Encounter: Payer: Self-pay | Admitting: Nurse Practitioner

## 2022-10-25 ENCOUNTER — Other Ambulatory Visit (HOSPITAL_COMMUNITY)
Admission: RE | Admit: 2022-10-25 | Discharge: 2022-10-25 | Disposition: A | Discharge status: Home / Self Care | Payer: BC Managed Care – PPO | Source: Ambulatory Visit | Attending: Nurse Practitioner | Admitting: Nurse Practitioner

## 2022-10-25 ENCOUNTER — Ambulatory Visit (INDEPENDENT_AMBULATORY_CARE_PROVIDER_SITE_OTHER): Payer: BC Managed Care – PPO | Admitting: Nurse Practitioner

## 2022-10-25 VITALS — BP 118/78 | HR 44 | Ht 66.5 in | Wt 149.0 lb

## 2022-10-25 DIAGNOSIS — Z124 Encounter for screening for malignant neoplasm of cervix: Secondary | ICD-10-CM

## 2022-10-25 DIAGNOSIS — Z01419 Encounter for gynecological examination (general) (routine) without abnormal findings: Secondary | ICD-10-CM | POA: Diagnosis not present

## 2022-10-25 DIAGNOSIS — Z79890 Hormone replacement therapy (postmenopausal): Secondary | ICD-10-CM | POA: Diagnosis not present

## 2022-10-25 MED ORDER — ESTRADIOL 0.075 MG/24HR TD PTTW: 1.0000 | MEDICATED_PATCH | TRANSDERMAL | 3 refills | Status: DC

## 2022-10-25 MED ORDER — PROGESTERONE MICRONIZED 100 MG PO CAPS: 100.0000 mg | ORAL_CAPSULE | Freq: Every evening | ORAL | 3 refills | Status: DC

## 2022-10-25 NOTE — Progress Notes (Signed)
   Michele Bennett 03/04/1961 [id number]   History:  62 y.o. G0 presents for annual exam. Postmenopausal - on HRT, no bleeding. Normal pap history. Referred for pelvic floor PT last year for pain with great improvement.    Gynecologic History Patient's last menstrual period was 02/23/2018 (approximate).   Contraception/Family planning: post menopausal status Sexually active: No  Health Maintenance Last Pap: 04/22/2017. Results were: Normal neg HPV Last mammogram: 10/07/2021. Results were: Left breast calcifications, benign biopsy Last colonoscopy: 8 years ago Last Dexa: 09/11/2019. Results were: Normal  Past medical history, past surgical history, family history and social history were all reviewed and documented in the EPIC chart. Self-employed, Hotel manager. Mother diagnosed with breast cancer at age 2.   ROS:  A ROS was performed and pertinent positives and negatives are included.  Exam:  Vitals:   10/25/22 1148  BP: 118/78  Pulse: (!) 44  SpO2: 95%  Weight: 149 lb (67.6 kg)  Height: 5' 6.5" (1.689 m)   Body mass index is 23.69 kg/m.  General appearance:  Normal Thyroid:  Symmetrical, normal in size, without palpable masses or nodularity. Respiratory  Auscultation:  Clear without wheezing or rhonchi Cardiovascular  Auscultation:  Regular rate, without rubs, murmurs or gallops  Edema/varicosities:  Not grossly evident Abdominal  Soft,nontender, without masses, guarding or rebound.  Liver/spleen:  No organomegaly noted  Hernia:  None appreciated  Skin  Inspection:  Grossly normal Breasts: Examined lying and sitting.   Right: Without masses, retractions, nipple discharge or axillary adenopathy.   Left: Without masses, retractions, nipple discharge or axillary adenopathy. Genitourinary   Inguinal/mons:  Normal without inguinal adenopathy  External genitalia:  Normal appearing vulva with no masses, tenderness, or lesions  BUS/Urethra/Skene's glands:   Normal  Vagina:  Normal appearing with normal color and discharge, no lesions. Mild atrophic changes  Cervix:  Normal appearing without discharge or lesions. Stenotic  Uterus:  Normal in size, shape and contour.  Midline and mobile, nontender  Adnexa/parametria:     Rt: Normal in size, without masses or tenderness.   Lt: Normal in size, without masses or tenderness.  Anus and perineum: Normal  Digital rectal exam: Deferred  Patient informed chaperone available to be present for breast and pelvic exam. Patient has requested no chaperone to be present. Patient has been advised what will be completed during breast and pelvic exam.   Assessment/Plan:  62 y.o. G0 for annual exam.   Well female exam with routine gynecological exam - Education provided on SBEs, importance of preventative screenings, current guidelines, high calcium diet, regular exercise, and multivitamin daily.  Labs with PCP.   Postmenopausal hormone therapy - Plan: estradiol (DOTTI) 0.075 MG/24HR twice weekly, progesterone (PROMETRIUM) 100 MG capsule nightly. Doing well on this and wants to continue. Aware of risks with continued use.   Screening for cervical cancer - Plan: Cytology - PAP( [facility]). Normal pap history.   Screening for breast cancer - Normal mammogram history. Just overdue and plans to schedule. Continue annual screenings. Normal breast exam today.  Screening for colon cancer - colonoscopy 8 years ago. Will repeat at 10-year interval per GI's recommendation.   Screening for osteoporosis - Normal bone density 08/2019. Recommend repeating at age 86.   Return in 1 year for annual or sooner if needed.     Olivia Mackie DNP, 12:19 PM 10/25/2022

## 2022-10-26 ENCOUNTER — Other Ambulatory Visit: Payer: Self-pay | Admitting: Obstetrics and Gynecology

## 2022-10-26 DIAGNOSIS — Z1231 Encounter for screening mammogram for malignant neoplasm of breast: Secondary | ICD-10-CM

## 2022-10-27 ENCOUNTER — Ambulatory Visit
Admission: RE | Admit: 2022-10-27 | Discharge: 2022-10-27 | Disposition: A | Discharge status: Home / Self Care | Payer: BC Managed Care – PPO | Source: Ambulatory Visit | Attending: Obstetrics and Gynecology | Admitting: Obstetrics and Gynecology

## 2022-10-27 ENCOUNTER — Other Ambulatory Visit: Payer: Self-pay | Admitting: Nurse Practitioner

## 2022-10-27 DIAGNOSIS — B3731 Acute candidiasis of vulva and vagina: Secondary | ICD-10-CM

## 2022-10-27 DIAGNOSIS — Z1231 Encounter for screening mammogram for malignant neoplasm of breast: Secondary | ICD-10-CM

## 2022-10-27 MED ORDER — FLUCONAZOLE 150 MG PO TABS: 150.0000 mg | ORAL_TABLET | ORAL | 0 refills | Status: DC

## 2022-10-29 IMAGING — MG DIGITAL SCREENING BILAT W/ CAD
4 series · 4 of 4 positions shown · non-contrast
Comparison: Previous exam(s).

CLINICAL DATA: Screening.

EXAM:
DIGITAL SCREENING BILATERAL MAMMOGRAM WITH CAD
TECHNIQUE: Bilateral screening digital craniocaudal and mediolateral oblique
mammograms were obtained. The images were evaluated with
computer-aided detection.

[L MLO]
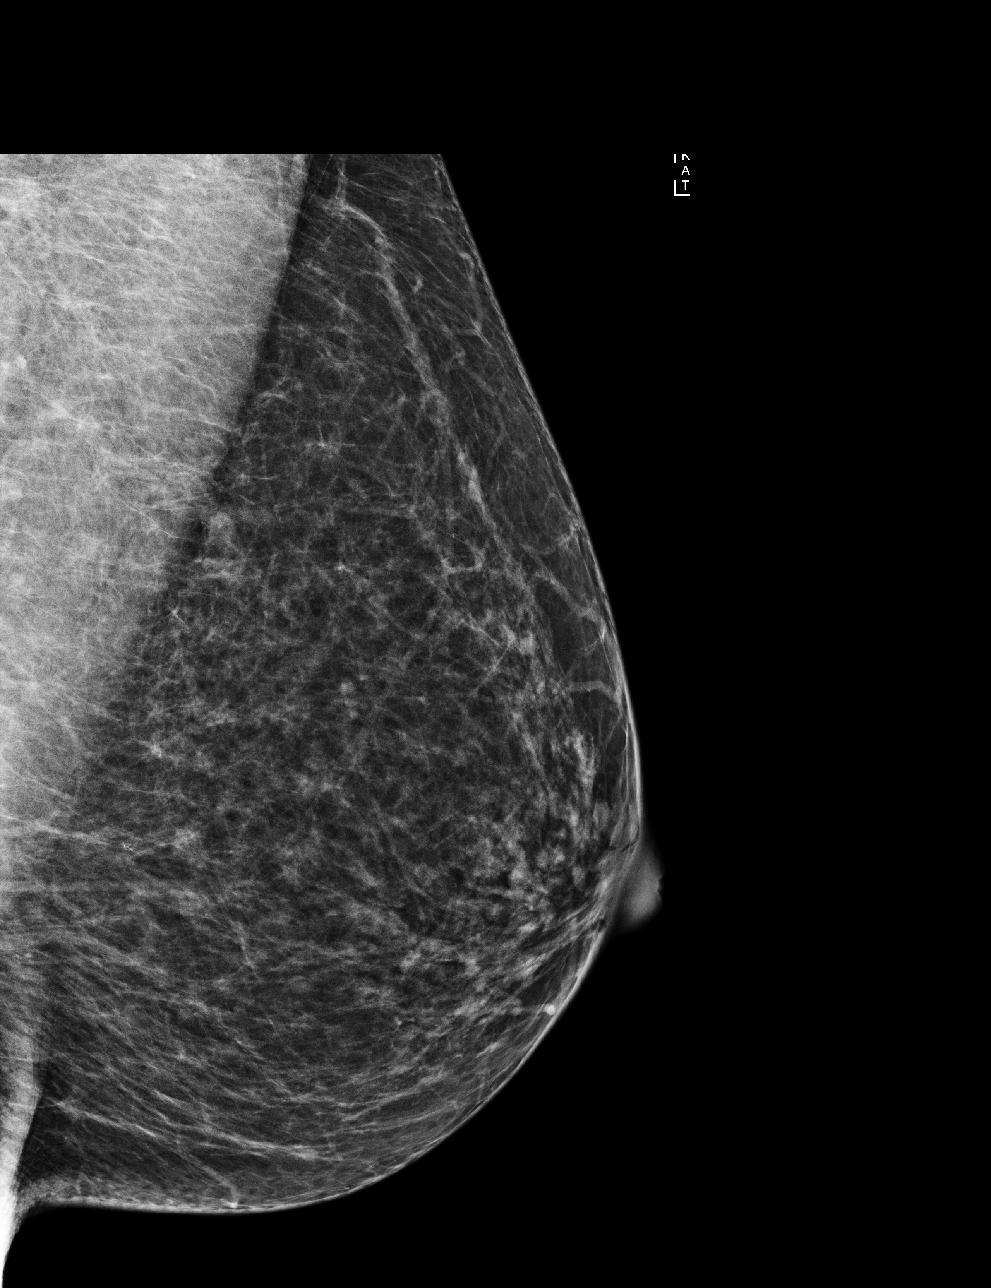

[R CC]
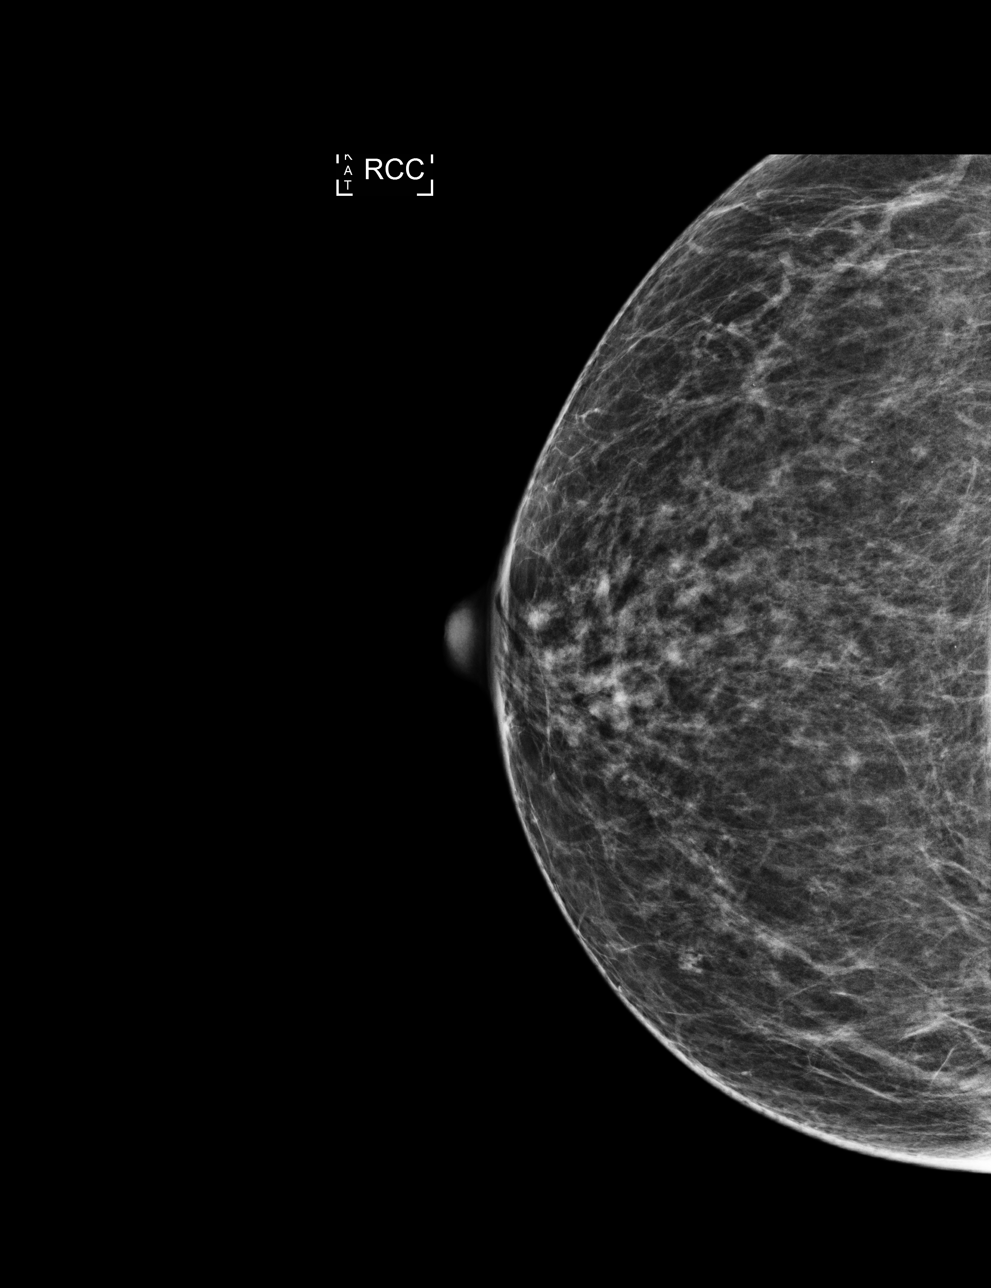

[R MLO]
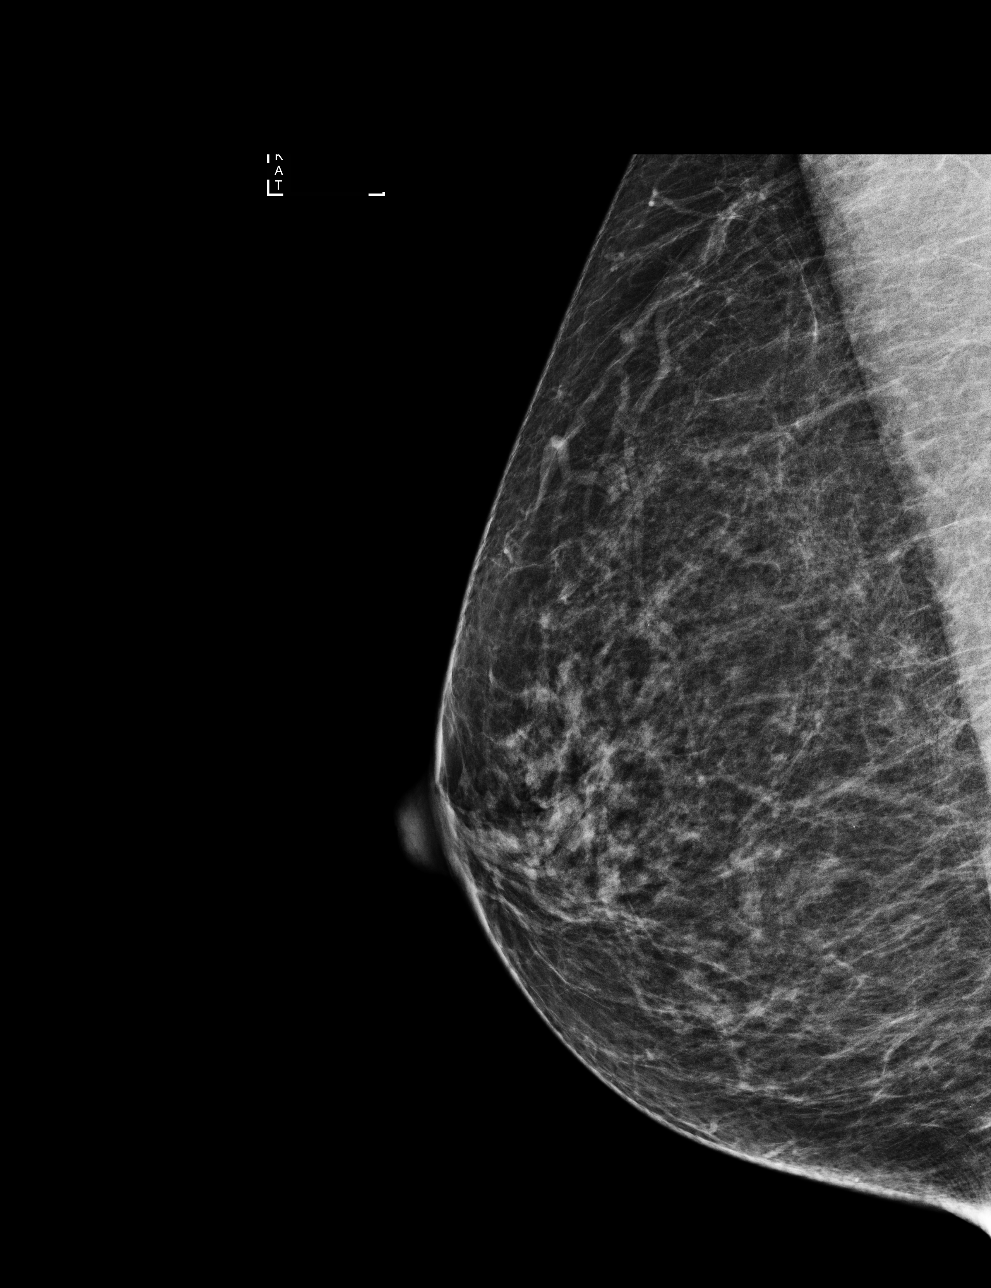

[L CC]
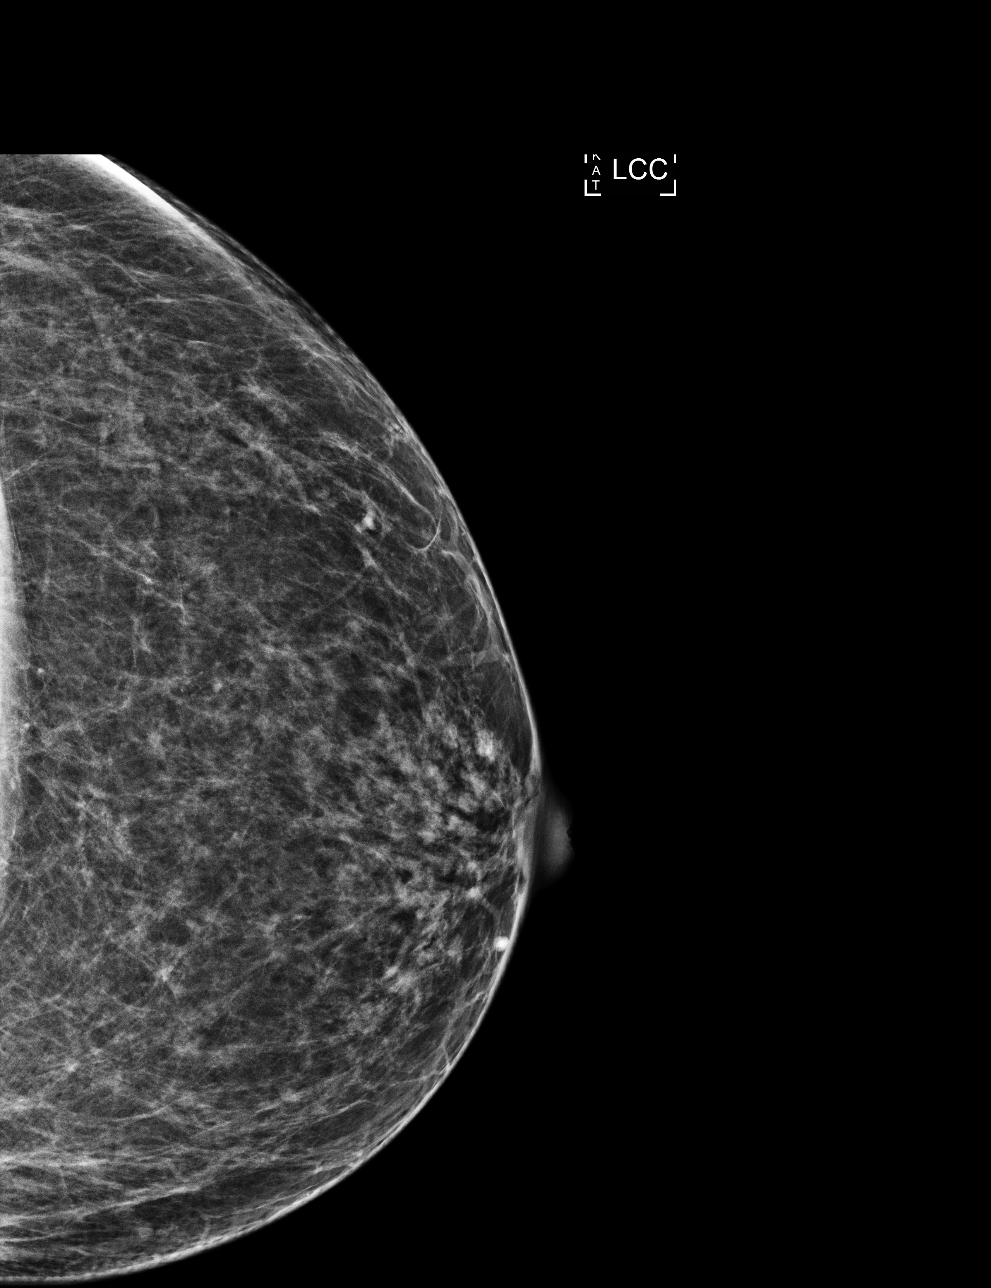

[4 of 4 positions shown; findings below may reference images not displayed]

ACR Breast Density Category b: There are scattered areas of
fibroglandular density.
FINDINGS: There are no findings suspicious for malignancy.
IMPRESSION: No mammographic evidence of malignancy. A result letter of this
screening mammogram will be mailed directly to the patient.

RECOMMENDATION:
Screening mammogram in one year. (Code:[id number]-V-ZRK)

BI-RADS CATEGORY  1: Negative.

## 2023-03-06 ENCOUNTER — Encounter: Payer: Self-pay | Admitting: Nurse Practitioner

## 2023-03-06 DIAGNOSIS — R1031 Right lower quadrant pain: Secondary | ICD-10-CM

## 2023-03-06 DIAGNOSIS — N393 Stress incontinence (female) (male): Secondary | ICD-10-CM

## 2023-03-10 NOTE — Telephone Encounter (Signed)
Referral sent. Will notify the pt via mychart.

## 2023-03-11 NOTE — Telephone Encounter (Signed)
FYI. Pt scheduled for 04/27/2023. Routing to provider for final review and encounter closed.

## 2023-03-22 ENCOUNTER — Ambulatory Visit: Payer: BC Managed Care – PPO | Attending: Obstetrics and Gynecology

## 2023-03-22 ENCOUNTER — Other Ambulatory Visit: Payer: Self-pay

## 2023-03-22 DIAGNOSIS — R102 Pelvic and perineal pain: Secondary | ICD-10-CM | POA: Insufficient documentation

## 2023-03-22 DIAGNOSIS — R293 Abnormal posture: Secondary | ICD-10-CM | POA: Insufficient documentation

## 2023-03-22 DIAGNOSIS — M6281 Muscle weakness (generalized): Secondary | ICD-10-CM | POA: Diagnosis present

## 2023-03-22 DIAGNOSIS — M62838 Other muscle spasm: Secondary | ICD-10-CM | POA: Insufficient documentation

## 2023-03-22 DIAGNOSIS — R279 Unspecified lack of coordination: Secondary | ICD-10-CM | POA: Insufficient documentation

## 2023-03-22 DIAGNOSIS — N393 Stress incontinence (female) (male): Secondary | ICD-10-CM | POA: Insufficient documentation

## 2023-03-22 DIAGNOSIS — R1031 Right lower quadrant pain: Secondary | ICD-10-CM | POA: Insufficient documentation

## 2023-03-22 NOTE — Therapy (Addendum)
 OUTPATIENT PHYSICAL THERAPY FEMALE PELVIC EVALUATION   Patient Name: Michele Bennett MRN: [id number] DOB:14-Feb-1961, 63 y.o., female Today's Date: 03/22/2023  END OF SESSION:  PT End of Session - 03/22/23 1229     Visit Number 1    Date for PT Re-Evaluation 09/06/23    Authorization Type BCBS    PT Start Time 1230    PT Stop Time 1310    PT Time Calculation (min) 40 min    Activity Tolerance Patient tolerated treatment well    Behavior During Therapy [facility] for tasks assessed/performed             Past Medical History:  Diagnosis Date   Allergy    Arthritis    Cancer (HCC) 2019   basal cell on chest and back   Cyst    hip; probable epidemoid inclusion cyst; resolution with antibiotics   Right knee meniscal tear  07/2011   Dr Lesleigh, Ortho; S/P cortisone injection   Spinal stenosis at L4-L5 level 2017   bilateral   Squamous cell carcinoma of leg, left 2022   Tear of left acetabular labrum 2006 ?   Dr Elihue   Thyroid  nodule    dx by Dr. Baird   Vitamin D  deficiency    Past Surgical History:  Procedure Laterality Date   ANKLE SURGERY  1989   Post fracture    DILATION AND CURETTAGE OF UTERUS  1988   FOOT SURGERY     left- bone spurs, Dr Liam   HAND SURGERY     Thumb surgery x 2   KNEE ARTHROPLASTY     KNEE ARTHROSCOPY Right 2013   LAPAROTOMY  1988   For Dysmenorrhea   REPLACEMENT TOTAL KNEE Right 11/2018   REPLACEMENT TOTAL KNEE Left 04/27/2021   Patient Active Problem List   Diagnosis Date Noted   Chronic back pain 02/25/2022   Sleep difficulties 07/21/2021   Right flank pain 01/21/2021   Tingling of both feet 01/05/2021   Multiple thyroid  nodules 07/10/2020   History of SCC (squamous cell carcinoma) of skin 05/16/2020   Thyroid  nodule-benign-no follow-up needed 12/12/2019   Spondylosis without myelopathy or radiculopathy, lumbar region 03/14/2019   Chest tightness 10/05/2018   Primary osteoarthritis of both knees 12/09/2016   Plantar  fasciitis 12/09/2016   Primary osteoarthritis of both feet 12/09/2016   Primary osteoarthritis of both hands 12/09/2016   DDD (degenerative disc disease), lumbar 12/09/2016   Lateral epicondylitis, right elbow 12/09/2016   Lumbar radiculopathy 02/25/2016   DDD (degenerative disc disease), cervical 10/19/2012   PVCs (premature ventricular contractions) 03/12/2011   Sebaceous cyst 10/14/2010   Vitamin D  deficiency 01/01/2008   DJD (degenerative joint disease), multiple sites 01/01/2008    PCP: Geofm Glade PARAS, MD  REFERRING PROVIDER: Cathlyn JAYSON Nikki Bobie FORBES, MD  REFERRING DIAG: N39.3 (ICD-10-CM) - Stress incontinence R10.31 (ICD-10-CM) - Right inguinal pain  THERAPY DIAG:  Pelvic pain  Other muscle spasm  Unspecified lack of coordination  Muscle weakness (generalized)  Abnormal posture  Rationale for Evaluation and Treatment: Rehabilitation  ONSET DATE: 09/15/20  SUBJECTIVE:  SUBJECTIVE STATEMENT: Pt states that she is having the same pain as last time. She feels like this is related to knee replacements because we are not 2 years out from her Lt knee replacement. She walks twice a week for 2 miles and has been working on stretching more.   Fluid intake: Yes: 32-40 oz water, tea     PAIN:  Are you having pain? Yes NPRS scale: 4/10, 6/10 at night Pain location:  pelvic pain/low back pain  Pain type: irritating Pain description: intermittent   Aggravating factors: wakes her up at night, pulling shoe off, prolonged sitting (back), transition from seated to standing Relieving factors: movement  PRECAUTIONS: None  RED FLAGS: None   WEIGHT BEARING RESTRICTIONS: No  FALLS:  Has patient fallen in last 6 months? No  LIVING ENVIRONMENT: Lives with: lives alone Lives in:  House/apartment   OCCUPATION: horticultural therapy  PLOF: Independent  PATIENT GOALS: decrease pain.   PERTINENT HISTORY:  Bil TKA Sexual abuse: No  BOWEL MOVEMENT: Pain with bowel movement: No Type of bowel movement:Frequency every other day and Strain No Fully empty rectum: Yes: - Leakage: No Pads: No Fiber supplement: No  URINATION: Pain with urination: No Fully empty bladder: Yes: - Stream:  Normal Urgency: No Frequency: WNL Leakage: Coughing, Sneezing, and Laughing when bladder is really full Pads: No  INTERCOURSE: Not sexually active   PREGNANCY: NA  PROLAPSE: None   OBJECTIVE:  Note: Objective measures were completed at Evaluation unless otherwise noted.  03/21/22: PFIQ-7: 43 COGNITION: Overall cognitive status: Within functional limits for tasks assessed     SENSATION: Light touch: Appears intact Proprioception: Appears intact  FUNCTIONAL TESTS:  Squat: Large Rt weight shift Single leg stance:  Rt: Lt pelvic drop  Lt: Rt pelvic drop  GAIT: Comments: WNL  POSTURE: rounded shoulders, forward head, decreased lumbar lordosis, decreased thoracic kyphosis, posterior pelvic tilt, and elevated Lt shoulder, elevated Rt iliac crest, Rt lumbar scoliosis   LUMBARAROM/PROM:  A/PROM A/PROM  Eval (% available)  Flexion 80  Extension 20  Right lateral flexion 75  Left lateral flexion 75  Right rotation 25  Left rotation 25   (Blank rows = not tested)   PALPATION:   General  No tenderness in abdomen; Bil adductor restriction                External Perineal Exam WNL                             Internal Pelvic Floor decreased muscle tone in Lt deep pelvic floor, tenderness throughout, trigger points present on Lt, pain and spasm in Lt obturator internus  Patient confirms identification and approves PT to assess internal pelvic floor and treatment Yes  PELVIC MMT:   MMT eval  Vaginal 2/5, 5 second endurance, 6 repeat contraction; difficulty  with full relaxation  Diastasis Recti 1-2 finger width separation without distortion  (Blank rows = not tested)        TONE: Lt low with areas of trigger points and spasm, Rt WNL  PROLAPSE: WNL  TODAY'S TREATMENT:  DATE:  03/22/23  EVAL  Manual: Pt provides verbal consent for internal vaginal/rectal pelvic floor exam. Pelvic floor muscle release vaginally in supine  Neuromuscular re-education: Diaphragmatic breathing with pelvic floor muscle relaxation Cat cow Child's pose Happy baby Supine butterfly Lower trunk rotation   PATIENT EDUCATION:  Education details: See above Person educated: Patient Education method: Explanation, Demonstration, Tactile cues, Verbal cues, and Handouts Education comprehension: verbalized understanding  HOME EXERCISE PROGRAM: [id number]  ASSESSMENT:  CLINICAL IMPRESSION: Patient is a 63 y.o. female who was seen today for physical therapy evaluation and treatment for Lt pelvic pain. Exam findings notable for abnormal posture, decreased lumbar A/ROM, decreased functional core/hip strength demonstrated by pelvic drop in single leg stance and large Rt weight shift with squat, decreased Lt transversus abdominus contraction strength, restriction in bil adductors, tenderness throughout superficial and deep LT pelvic floor muscles, decreased tone in Lt pelvic floor muscles except for localized area of trigger points and spasm in obturator internus, pelvic floor muscle weakness, decreased pelvic floor muscle strength, and decreased ability to fully relax pelvic floor muscle. Signs and symptoms are most consistent with Lt sided deep core weakness (most likely starting after Lt total knee replacement) that has lead to altered movement mechanics/posture and areas of muscle spasm/trigger points in pelvic floor. Initial treatment consisted of  pelvic floor muscle release and down training/pelvic floor muscle relaxation activities, all tolerated well. She will continue to benefit from skilled PT intervention in order to decrease pelvic pain and begin/progress functional strengthening program.   OBJECTIVE IMPAIRMENTS: decreased activity tolerance, decreased coordination, decreased endurance, decreased strength, increased fascial restrictions, increased muscle spasms, impaired tone, postural dysfunction, and pain.   ACTIVITY LIMITATIONS: continence  PARTICIPATION LIMITATIONS: community activity and occupation  PERSONAL FACTORS: 1 comorbidity: medical history  are also affecting patient's functional outcome.   REHAB POTENTIAL: Good  CLINICAL DECISION MAKING: Stable/uncomplicated  EVALUATION COMPLEXITY: Low   GOALS: Goals reviewed with patient? Yes  SHORT TERM GOALS: Target date: 04/19/23  Pt will be independent with HEP.   Baseline: Goal status: INITIAL  2.  Pt will be able to perform single leg stance of Lt LE and pull Rt shoe off and perform Lt rotation without any increase in Rt pelvic pain.  Baseline:  Goal status: INITIAL  3.  Pt will be independent with diaphragmatic breathing and down training activities in order to improve pelvic floor relaxation.  Baseline:  Goal status: INITIAL  4.  Pt will report pelvic pain no greater than 4/10. Baseline:  Goal status: INITIAL  5.   Baseline:  Goal status: INITIAL   LONG TERM GOALS: Target date: 09/06/23  Pt will be independent with advanced HEP.   Baseline:  Goal status: INITIAL  2.  Pt will be able to perform all gardening activities without any increase in Rt pelvic pain.  Baseline:  Goal status: INITIAL  3.  Pt will increase all impaired lumbar A/ROM by 25% in order to help restore normal lumbopelvic balance.  Baseline:  Goal status: INITIAL  4.  Pt will report no leaks with laughing, coughing, sneezing in order to improve comfort with interpersonal  relationships and community activities.   Baseline:  Goal status: INITIAL  5.  Pt will demonstrate good pelvic stability in single leg stance and no weight shift in squat to show improved core strength. Baseline:  Goal status: INITIAL  6.  Pt will report pelvic pain no greater than 2/10/  Baseline:  Goal status: INITIAL  PLAN:  PT FREQUENCY:  1-2x/week  PT DURATION: 6 months  PLANNED INTERVENTIONS: 97110-Therapeutic exercises, 97530- Therapeutic activity, [id number]- Neuromuscular re-education, 97535- Self Care, [id number]- Manual therapy, Dry Needling, and Biofeedback  PLAN FOR NEXT SESSION: Internal vaginal Lt pelvic floor muscle release to pt tolerance; manual techniques to bil adductors; adductor stretches; begin core training.    Josette Mares, PT, DPT01/07/251:42 PM

## 2023-04-27 ENCOUNTER — Ambulatory Visit: Payer: BC Managed Care – PPO

## 2023-05-30 ENCOUNTER — Ambulatory Visit: Payer: BC Managed Care – PPO | Attending: Obstetrics and Gynecology

## 2023-05-30 DIAGNOSIS — R293 Abnormal posture: Secondary | ICD-10-CM | POA: Diagnosis present

## 2023-05-30 DIAGNOSIS — M62838 Other muscle spasm: Secondary | ICD-10-CM | POA: Diagnosis present

## 2023-05-30 DIAGNOSIS — R279 Unspecified lack of coordination: Secondary | ICD-10-CM | POA: Insufficient documentation

## 2023-05-30 DIAGNOSIS — R102 Pelvic and perineal pain: Secondary | ICD-10-CM | POA: Insufficient documentation

## 2023-05-30 DIAGNOSIS — M6281 Muscle weakness (generalized): Secondary | ICD-10-CM | POA: Insufficient documentation

## 2023-05-30 NOTE — Patient Instructions (Signed)

## 2023-05-30 NOTE — Therapy (Signed)
 OUTPATIENT PHYSICAL THERAPY FEMALE PELVIC TREATMENT   Patient Name: Michele Bennett MRN: [id number] DOB:[birth year], 63 y.o., female Today's Date: 05/30/2023  END OF SESSION:  PT End of Session - 05/30/23 1532     Visit Number 2    Date for PT Re-Evaluation 09/06/23    Authorization Type BCBS    PT Start Time 1530    PT Stop Time 1610    PT Time Calculation (min) 40 min    Activity Tolerance Patient tolerated treatment well    Behavior During Therapy [facility] for tasks assessed/performed             Past Medical History:  Diagnosis Date   Allergy    Arthritis    Cancer (HCC) 2019   basal cell on chest and back   Cyst    hip; probable epidemoid inclusion cyst; resolution with antibiotics   Right knee meniscal tear  07/2011   Dr Elita Quick, Ortho; S/P cortisone injection   Spinal stenosis at L4-L5 level 2017   bilateral   Squamous cell carcinoma of leg, left 2022   Tear of left acetabular labrum 2006 ?   Dr Julius Bowels   Thyroid nodule    dx by Dr. Jillyn Hidden   Vitamin D deficiency    Past Surgical History:  Procedure Laterality Date   ANKLE SURGERY  1989   Post fracture    DILATION AND CURETTAGE OF UTERUS  1988   FOOT SURGERY     left- bone spurs, Dr Turner Daniels   HAND SURGERY     Thumb surgery x 2   KNEE ARTHROPLASTY     KNEE ARTHROSCOPY Right 2013   LAPAROTOMY  1988   For Dysmenorrhea   REPLACEMENT TOTAL KNEE Right 11/2018   REPLACEMENT TOTAL KNEE Left 04/27/2021   Patient Active Problem List   Diagnosis Date Noted   Chronic back pain 02/25/2022   Sleep difficulties 07/21/2021   Right flank pain 01/21/2021   Tingling of both feet 01/05/2021   Multiple thyroid nodules 07/10/2020   History of SCC (squamous cell carcinoma) of skin 05/16/2020   Thyroid nodule-benign-no follow-up needed 12/12/2019   Spondylosis without myelopathy or radiculopathy, lumbar region 03/14/2019   Chest tightness 10/05/2018   Primary osteoarthritis of both knees 12/09/2016   Plantar  fasciitis 12/09/2016   Primary osteoarthritis of both feet 12/09/2016   Primary osteoarthritis of both hands 12/09/2016   DDD (degenerative disc disease), lumbar 12/09/2016   Lateral epicondylitis, right elbow 12/09/2016   Lumbar radiculopathy 02/25/2016   DDD (degenerative disc disease), cervical 10/19/2012   PVCs (premature ventricular contractions) 03/12/2011   Sebaceous cyst 10/14/2010   Vitamin D deficiency 01/01/2008   DJD (degenerative joint disease), multiple sites 01/01/2008    PCP: Pincus Sanes, MD  REFERRING PROVIDER: Patton Salles, MD  REFERRING DIAG: N39.3 (ICD-10-CM) - Stress incontinence R10.31 (ICD-10-CM) - Right inguinal pain  THERAPY DIAG:  Pelvic pain  Other muscle spasm  Muscle weakness (generalized)  Unspecified lack of coordination  Abnormal posture  Rationale for Evaluation and Treatment: Rehabilitation  ONSET DATE: 09/15/20  SUBJECTIVE:  SUBJECTIVE STATEMENT: Pt states that Lt side is very easily flared up. She is better now, but for several weeks she was having significant pain. She feels like she has less flexibility on her Lt side compared to the Rt.   Fluid intake: Yes: 32-40 oz water, tea     PAIN: 05/30/23 Are you having pain? Yes NPRS scale: 4/10 Pain location:  pelvic pain/low back pain  Pain type: irritating Pain description: intermittent   Aggravating factors: wakes her up at night, pulling shoe off, prolonged sitting (back), transition from seated to standing Relieving factors: movement  PRECAUTIONS: None  RED FLAGS: None   WEIGHT BEARING RESTRICTIONS: No  FALLS:  Has patient fallen in last 6 months? No  LIVING ENVIRONMENT: Lives with: lives alone Lives in: House/apartment   OCCUPATION: horticultural therapy  PLOF:  Independent  PATIENT GOALS: decrease pain.   PERTINENT HISTORY:  Bil TKA Sexual abuse: No  BOWEL MOVEMENT: Pain with bowel movement: No Type of bowel movement:Frequency every other day and Strain No Fully empty rectum: Yes: - Leakage: No Pads: No Fiber supplement: No  URINATION: Pain with urination: No Fully empty bladder: Yes: - Stream:  Normal Urgency: No Frequency: WNL Leakage: Coughing, Sneezing, and Laughing when bladder is really full Pads: No  INTERCOURSE: Not sexually active   PREGNANCY: NA  PROLAPSE: None   OBJECTIVE:  Note: Objective measures were completed at Evaluation unless otherwise noted.  03/21/22: PFIQ-7: 43 COGNITION: Overall cognitive status: Within functional limits for tasks assessed     SENSATION: Light touch: Appears intact Proprioception: Appears intact  FUNCTIONAL TESTS:  Squat: Large Rt weight shift Single leg stance:  Rt: Lt pelvic drop  Lt: Rt pelvic drop  GAIT: Comments: WNL  POSTURE: rounded shoulders, forward head, decreased lumbar lordosis, decreased thoracic kyphosis, posterior pelvic tilt, and elevated Lt shoulder, elevated Rt iliac crest, Rt lumbar scoliosis   LUMBARAROM/PROM:  A/PROM A/PROM  Eval (% available)  Flexion 80  Extension 20  Right lateral flexion 75  Left lateral flexion 75  Right rotation 25  Left rotation 25   (Blank rows = not tested)   PALPATION:   General  No tenderness in abdomen; Bil adductor restriction                External Perineal Exam WNL                             Internal Pelvic Floor decreased muscle tone in Lt deep pelvic floor, tenderness throughout, trigger points present on Lt, pain and spasm in Lt obturator internus  Patient confirms identification and approves PT to assess internal pelvic floor and treatment Yes  PELVIC MMT:   MMT eval  Vaginal 2/5, 5 second endurance, 6 repeat contraction; difficulty with full relaxation  Diastasis Recti 1-2 finger width  separation without distortion  (Blank rows = not tested)        TONE: Lt low with areas of trigger points and spasm, Rt WNL  PROLAPSE: WNL  TODAY'S TREATMENT:  DATE:  05/30/23 Manual: Pt provides verbal consent for internal vaginal/rectal pelvic floor exam. Internal vaginal pelvic floor muscle release in supine to LT superficial and deep layers Internal obturator internus Lt trigger point release Soft tissue mobilization to Lt adductors Trigger Point Dry Needling  Initial Treatment: Pt instructed on Dry Needling rational, procedures, and possible side effects. Pt instructed to expect mild to moderate muscle soreness later in the day and/or into the next day.  Pt instructed in methods to reduce muscle soreness. Pt instructed to continue prescribed HEP. Patient was educated on signs and symptoms of infection and other risk factors and advised to seek medical attention should they occur.  Patient verbalized understanding of these instructions and education.   Patient Verbal Consent Given: Yes Education Handout Provided: Yes Muscles Treated: Lt obturator internus and Lt adductors  Electrical Stimulation Performed: No Treatment Response/Outcome: twitch response and release Exercises: Standing adductor stretch 2 x 60 sec bil Frog stretch 2 min Seated piriformis stretch 60 sec bil Prone windshield wipers Therapeutic activities: Pelvic floor muscle wand education   03/22/23  EVAL  Manual: Pt provides verbal consent for internal vaginal/rectal pelvic floor exam. Pelvic floor muscle release vaginally in supine  Neuromuscular re-education: Diaphragmatic breathing with pelvic floor muscle relaxation Cat cow Child's pose Happy baby Supine butterfly Lower trunk rotation   PATIENT EDUCATION:  Education details: See above Person educated: Patient Education  method: Explanation, Demonstration, Tactile cues, Verbal cues, and Handouts Education comprehension: verbalized understanding  HOME EXERCISE PROGRAM: [id number]  ASSESSMENT:  CLINICAL IMPRESSION: Pt is doing better right now, but states that she has had a lot of pain in the last couple of weeks. We decided to perform internal Lt sided pelvic floor muscle and obturator internus followd by dry needling to this area. We also performed dry needling in Lt adductors with significant twitch response and release. Soft tissue mobilization performed to Lt adductors with good tolerance. HEP updated with stretches for adductors and glutes with very good tolerance. She reported 0/10 pain at end of session and felt like there was no restriction keeping her from ambulating normally. She will continue to benefit from skilled PT intervention in order to decrease pelvic pain and begin/progress functional strengthening program.   OBJECTIVE IMPAIRMENTS: decreased activity tolerance, decreased coordination, decreased endurance, decreased strength, increased fascial restrictions, increased muscle spasms, impaired tone, postural dysfunction, and pain.   ACTIVITY LIMITATIONS: continence  PARTICIPATION LIMITATIONS: community activity and occupation  PERSONAL FACTORS: 1 comorbidity: medical history  are also affecting patient's functional outcome.   REHAB POTENTIAL: Good  CLINICAL DECISION MAKING: Stable/uncomplicated  EVALUATION COMPLEXITY: Low   GOALS: Goals reviewed with patient? Yes  SHORT TERM GOALS: Target date: 04/19/23 - updated 05/30/23  Pt will be independent with HEP.   Baseline: Goal status: IN PROGRESS 05/30/23  2.  Pt will be able to perform single leg stance of Lt LE and pull Rt shoe off and perform Lt rotation without any increase in Rt pelvic pain.  Baseline:  Goal status: IN PROGRESS 05/30/23  3.  Pt will be independent with diaphragmatic breathing and down training activities in order to  improve pelvic floor relaxation.  Baseline:  Goal status: IN PROGRESS 05/30/23  4.  Pt will report pelvic pain no greater than 4/10. Baseline:  Goal status: IN PROGRESS 05/30/23  5.   Baseline:  Goal status: IN PROGRESS 05/30/23   LONG TERM GOALS: Target date: 09/06/23 - updated 05/30/23  Pt will be independent with advanced HEP.  Baseline:  Goal status: IN PROGRESS 05/30/23  2.  Pt will be able to perform all gardening activities without any increase in Rt pelvic pain.  Baseline:  Goal status: IN PROGRESS 05/30/23  3.  Pt will increase all impaired lumbar A/ROM by 25% in order to help restore normal lumbopelvic balance.  Baseline:  Goal status: IN PROGRESS 05/30/23  4.  Pt will report no leaks with laughing, coughing, sneezing in order to improve comfort with interpersonal relationships and community activities.   Baseline:  Goal status: IN PROGRESS 05/30/23  5.  Pt will demonstrate good pelvic stability in single leg stance and no weight shift in squat to show improved core strength. Baseline:  Goal status: IN PROGRESS 05/30/23  6.  Pt will report pelvic pain no greater than 2/10/  Baseline:  Goal status: IN PROGRESS 05/30/23  PLAN:  PT FREQUENCY: 1-2x/week  PT DURATION: 6 months  PLANNED INTERVENTIONS: 97110-Therapeutic exercises, 97530- Therapeutic activity, 97112- Neuromuscular re-education, 97535- Self Care, [id number]- Manual therapy, Dry Needling, and Biofeedback  PLAN FOR NEXT SESSION: Internal vaginal Lt pelvic floor muscle release to pt tolerance; manual techniques to bil adductors; adductor stretches; begin core training. Dry needle Lt glutes.    Julio Alm, PT, DPT03/17/254:40 PM

## 2023-06-06 ENCOUNTER — Ambulatory Visit: Payer: BC Managed Care – PPO

## 2023-06-06 DIAGNOSIS — R102 Pelvic and perineal pain unspecified side: Secondary | ICD-10-CM

## 2023-06-06 DIAGNOSIS — M6281 Muscle weakness (generalized): Secondary | ICD-10-CM

## 2023-06-06 DIAGNOSIS — R279 Unspecified lack of coordination: Secondary | ICD-10-CM

## 2023-06-06 DIAGNOSIS — R293 Abnormal posture: Secondary | ICD-10-CM

## 2023-06-06 DIAGNOSIS — M62838 Other muscle spasm: Secondary | ICD-10-CM

## 2023-06-06 NOTE — Therapy (Signed)
 OUTPATIENT PHYSICAL THERAPY FEMALE PELVIC TREATMENT   Patient Name: Michele Bennett MRN: [id number] DOB:02-Feb-1961, 63 y.o., female Today's Date: 06/06/2023  END OF SESSION:  PT End of Session - 06/06/23 1529     Visit Number 3    Date for PT Re-Evaluation 09/06/23    Authorization Type [employer]    Authorization Time Period 05/25/2023-06/23/2023    Authorization - Visit Number 2    Authorization - Number of Visits 4    PT Start Time 1530    PT Stop Time 1610    PT Time Calculation (min) 40 min    Activity Tolerance Patient tolerated treatment well    Behavior During Therapy [facility] for tasks assessed/performed             Past Medical History:  Diagnosis Date   Allergy    Arthritis    Cancer (HCC) 2019   basal cell on chest and back   Cyst    hip; probable epidemoid inclusion cyst; resolution with antibiotics   Right knee meniscal tear  07/2011   Dr Elita Quick, Ortho; S/P cortisone injection   Spinal stenosis at L4-L5 level 2017   bilateral   Squamous cell carcinoma of leg, left 2022   Tear of left acetabular labrum 2006 ?   Dr Julius Bowels   Thyroid nodule    dx by Dr. Jillyn Hidden   Vitamin D deficiency    Past Surgical History:  Procedure Laterality Date   ANKLE SURGERY  1989   Post fracture    DILATION AND CURETTAGE OF UTERUS  1988   FOOT SURGERY     left- bone spurs, Dr Turner Daniels   HAND SURGERY     Thumb surgery x 2   KNEE ARTHROPLASTY     KNEE ARTHROSCOPY Right 2013   LAPAROTOMY  1988   For Dysmenorrhea   REPLACEMENT TOTAL KNEE Right 11/2018   REPLACEMENT TOTAL KNEE Left 04/27/2021   Patient Active Problem List   Diagnosis Date Noted   Chronic back pain 02/25/2022   Sleep difficulties 07/21/2021   Right flank pain 01/21/2021   Tingling of both feet 01/05/2021   Multiple thyroid nodules 07/10/2020   History of SCC (squamous cell carcinoma) of skin 05/16/2020   Thyroid nodule-benign-no follow-up needed 12/12/2019   Spondylosis without myelopathy or radiculopathy,  lumbar region 03/14/2019   Chest tightness 10/05/2018   Primary osteoarthritis of both knees 12/09/2016   Plantar fasciitis 12/09/2016   Primary osteoarthritis of both feet 12/09/2016   Primary osteoarthritis of both hands 12/09/2016   DDD (degenerative disc disease), lumbar 12/09/2016   Lateral epicondylitis, right elbow 12/09/2016   Lumbar radiculopathy 02/25/2016   DDD (degenerative disc disease), cervical 10/19/2012   PVCs (premature ventricular contractions) 03/12/2011   Sebaceous cyst 10/14/2010   Vitamin D deficiency 01/01/2008   DJD (degenerative joint disease), multiple sites 01/01/2008    PCP: Pincus Sanes, MD  REFERRING PROVIDER: Patton Salles, MD  REFERRING DIAG: N39.3 (ICD-10-CM) - Stress incontinence R10.31 (ICD-10-CM) - Right inguinal pain  THERAPY DIAG:  Pelvic pain  Other muscle spasm  Muscle weakness (generalized)  Unspecified lack of coordination  Abnormal posture  Rationale for Evaluation and Treatment: Rehabilitation  ONSET DATE: 09/15/20  SUBJECTIVE:  SUBJECTIVE STATEMENT: Pt states that she was not super sore after last visit. She states that pain has been better over the last week; she describes just having soreness in the last week.   Fluid intake: Yes: 32-40 oz water, tea     PAIN: 06/06/23 Are you having pain? Yes NPRS scale: 3-4/10 Pain location:  pelvic pain/low back pain  Pain type: irritating Pain description: intermittent   Aggravating factors: wakes her up at night, pulling shoe off, prolonged sitting (back), transition from seated to standing Relieving factors: movement  PRECAUTIONS: None  RED FLAGS: None   WEIGHT BEARING RESTRICTIONS: No  FALLS:  Has patient fallen in last 6 months? No  LIVING ENVIRONMENT: Lives with: lives  alone Lives in: House/apartment   OCCUPATION: horticultural therapy  PLOF: Independent  PATIENT GOALS: decrease pain.   PERTINENT HISTORY:  Bil TKA Sexual abuse: No  BOWEL MOVEMENT: Pain with bowel movement: No Type of bowel movement:Frequency every other day and Strain No Fully empty rectum: Yes: - Leakage: No Pads: No Fiber supplement: No  URINATION: Pain with urination: No Fully empty bladder: Yes: - Stream:  Normal Urgency: No Frequency: WNL Leakage: Coughing, Sneezing, and Laughing when bladder is really full Pads: No  INTERCOURSE: Not sexually active   PREGNANCY: NA  PROLAPSE: None   OBJECTIVE:  Note: Objective measures were completed at Evaluation unless otherwise noted.  03/21/22: PFIQ-7: 43 COGNITION: Overall cognitive status: Within functional limits for tasks assessed     SENSATION: Light touch: Appears intact Proprioception: Appears intact  FUNCTIONAL TESTS:  Squat: Large Rt weight shift Single leg stance:  Rt: Lt pelvic drop  Lt: Rt pelvic drop  GAIT: Comments: WNL  POSTURE: rounded shoulders, forward head, decreased lumbar lordosis, decreased thoracic kyphosis, posterior pelvic tilt, and elevated Lt shoulder, elevated Rt iliac crest, Rt lumbar scoliosis   LUMBARAROM/PROM:  A/PROM A/PROM  Eval (% available)  Flexion 80  Extension 20  Right lateral flexion 75  Left lateral flexion 75  Right rotation 25  Left rotation 25   (Blank rows = not tested)   PALPATION:   General  No tenderness in abdomen; Bil adductor restriction                External Perineal Exam WNL                             Internal Pelvic Floor decreased muscle tone in Lt deep pelvic floor, tenderness throughout, trigger points present on Lt, pain and spasm in Lt obturator internus  Patient confirms identification and approves PT to assess internal pelvic floor and treatment Yes  PELVIC MMT:   MMT eval  Vaginal 2/5, 5 second endurance, 6 repeat  contraction; difficulty with full relaxation  Diastasis Recti 1-2 finger width separation without distortion  (Blank rows = not tested)        TONE: Lt low with areas of trigger points and spasm, Rt WNL  PROLAPSE: WNL  TODAY'S TREATMENT:  DATE:  06/06/23 Manual: Pt provides verbal consent for internal vaginal/rectal pelvic floor exam. Internal vaginal pelvic floor muscle release in supine to LT superficial and deep layers Internal obturator internus Lt trigger point release Soft tissue mobilization to Lt adductors Trigger Point Dry Needling  Initial Treatment: Pt instructed on Dry Needling rational, procedures, and possible side effects. Pt instructed to expect mild to moderate muscle soreness later in the day and/or into the next day.  Pt instructed in methods to reduce muscle soreness. Pt instructed to continue prescribed HEP. Patient was educated on signs and symptoms of infection and other risk factors and advised to seek medical attention should they occur.  Patient verbalized understanding of these instructions and education.   Patient Verbal Consent Given: Yes Education Handout Provided: Yes Muscles Treated: Lt obturator internus and Lt adductors  Electrical Stimulation Performed: No Treatment Response/Outcome: twitch response and release Neuromuscular re-education: Seated resisted march red band with transversus abdominus and pelvic floor muscle 2 x 10 Seated hip adduction ball press with transversus abdominus and pelvic floor muscle 2 x 10 Seated hip abduction red band with transversus abdominus and pelvic floor muscle 2 x 10 Seated hip internal rotation red band with transversus abdominus and pelvic floor muscle 2 x 10  05/30/23 Manual: Pt provides verbal consent for internal vaginal/rectal pelvic floor exam. Internal vaginal pelvic floor muscle  release in supine to LT superficial and deep layers Internal obturator internus Lt trigger point release Soft tissue mobilization to Lt adductors Trigger Point Dry Needling  Initial Treatment: Pt instructed on Dry Needling rational, procedures, and possible side effects. Pt instructed to expect mild to moderate muscle soreness later in the day and/or into the next day.  Pt instructed in methods to reduce muscle soreness. Pt instructed to continue prescribed HEP. Patient was educated on signs and symptoms of infection and other risk factors and advised to seek medical attention should they occur.  Patient verbalized understanding of these instructions and education.   Patient Verbal Consent Given: Yes Education Handout Provided: Yes Muscles Treated: Lt obturator internus and Lt adductors  Electrical Stimulation Performed: No Treatment Response/Outcome: twitch response and release Exercises: Standing adductor stretch 2 x 60 sec bil Frog stretch 2 min Seated piriformis stretch 60 sec bil Prone windshield wipers Therapeutic activities: Pelvic floor muscle wand education   03/22/23  EVAL  Manual: Pt provides verbal consent for internal vaginal/rectal pelvic floor exam. Pelvic floor muscle release vaginally in supine  Neuromuscular re-education: Diaphragmatic breathing with pelvic floor muscle relaxation Cat cow Child's pose Happy baby Supine butterfly Lower trunk rotation   PATIENT EDUCATION:  Education details: See above Person educated: Patient Education method: Explanation, Demonstration, Tactile cues, Verbal cues, and Handouts Education comprehension: verbalized understanding  HOME EXERCISE PROGRAM: [id number]  ASSESSMENT:  CLINICAL IMPRESSION: Pt doing very well with decreased pain over the last week. Due to this, we continued dry needling and manual techniques to adductors, obturator internus, and pelvic floor muscles with excellent tolerance. She had more signfiicant  twitch response in adductors this week, likely leading to more soreness after today. We progressed exercises to include some hip/deep core strengthening with no increase in pain. HEP updated. She will continue to benefit from skilled PT intervention in order to decrease pelvic pain and begin/progress functional strengthening program.   OBJECTIVE IMPAIRMENTS: decreased activity tolerance, decreased coordination, decreased endurance, decreased strength, increased fascial restrictions, increased muscle spasms, impaired tone, postural dysfunction, and pain.   ACTIVITY LIMITATIONS: continence  PARTICIPATION LIMITATIONS: community activity  and occupation  PERSONAL FACTORS: 1 comorbidity: medical history  are also affecting patient's functional outcome.   REHAB POTENTIAL: Good  CLINICAL DECISION MAKING: Stable/uncomplicated  EVALUATION COMPLEXITY: Low   GOALS: Goals reviewed with patient? Yes  SHORT TERM GOALS: Target date: 04/19/23 - updated 05/30/23  Pt will be independent with HEP.   Baseline: Goal status: IN PROGRESS 05/30/23  2.  Pt will be able to perform single leg stance of Lt LE and pull Rt shoe off and perform Lt rotation without any increase in Rt pelvic pain.  Baseline:  Goal status: IN PROGRESS 05/30/23  3.  Pt will be independent with diaphragmatic breathing and down training activities in order to improve pelvic floor relaxation.  Baseline:  Goal status: IN PROGRESS 05/30/23  4.  Pt will report pelvic pain no greater than 4/10. Baseline:  Goal status: IN PROGRESS 05/30/23  5.   Baseline:  Goal status: IN PROGRESS 05/30/23   LONG TERM GOALS: Target date: 09/06/23 - updated 05/30/23  Pt will be independent with advanced HEP.   Baseline:  Goal status: IN PROGRESS 05/30/23  2.  Pt will be able to perform all gardening activities without any increase in Rt pelvic pain.  Baseline:  Goal status: IN PROGRESS 05/30/23  3.  Pt will increase all impaired lumbar A/ROM by 25%  in order to help restore normal lumbopelvic balance.  Baseline:  Goal status: IN PROGRESS 05/30/23  4.  Pt will report no leaks with laughing, coughing, sneezing in order to improve comfort with interpersonal relationships and community activities.   Baseline:  Goal status: IN PROGRESS 05/30/23  5.  Pt will demonstrate good pelvic stability in single leg stance and no weight shift in squat to show improved core strength. Baseline:  Goal status: IN PROGRESS 05/30/23  6.  Pt will report pelvic pain no greater than 2/10.  Baseline:  Goal status: IN PROGRESS 05/30/23  PLAN:  PT FREQUENCY: 1-2x/week  PT DURATION: 6 months  PLANNED INTERVENTIONS: 97110-Therapeutic exercises, 97530- Therapeutic activity, 97112- Neuromuscular re-education, 97535- Self Care, 56213- Manual therapy, Dry Needling, and Biofeedback  PLAN FOR NEXT SESSION: Internal vaginal Lt pelvic floor muscle release to pt tolerance; manual techniques to bil adductors; adductor stretches; begin core training. Dry needle Lt glutes.    Julio Alm, PT, DPT03/24/254:16 PM

## 2023-06-13 ENCOUNTER — Ambulatory Visit: Payer: BC Managed Care – PPO

## 2023-06-13 DIAGNOSIS — M62838 Other muscle spasm: Secondary | ICD-10-CM

## 2023-06-13 DIAGNOSIS — R102 Pelvic and perineal pain: Secondary | ICD-10-CM | POA: Diagnosis not present

## 2023-06-13 DIAGNOSIS — R279 Unspecified lack of coordination: Secondary | ICD-10-CM

## 2023-06-13 DIAGNOSIS — M6281 Muscle weakness (generalized): Secondary | ICD-10-CM

## 2023-06-13 DIAGNOSIS — R293 Abnormal posture: Secondary | ICD-10-CM

## 2023-06-13 NOTE — Therapy (Signed)
 OUTPATIENT PHYSICAL THERAPY FEMALE PELVIC TREATMENT   Patient Name: Michele Bennett MRN: [id number] DOB:09/08/1960, 63 y.o., female Today's Date: 06/13/2023  END OF SESSION:  PT End of Session - 06/13/23 1534     Visit Number 4    Date for PT Re-Evaluation 09/06/23    Authorization Type BCBS    Authorization Time Period 05/25/2023-06/23/2023    Authorization - Visit Number 3    Authorization - Number of Visits 4    PT Start Time 1530    PT Stop Time 1610    PT Time Calculation (min) 40 min    Activity Tolerance Patient tolerated treatment well    Behavior During Therapy [facility] for tasks assessed/performed             Past Medical History:  Diagnosis Date   Allergy    Arthritis    Cancer (HCC) 2019   basal cell on chest and back   Cyst    hip; probable epidemoid inclusion cyst; resolution with antibiotics   Right knee meniscal tear  07/2011   Dr Elita Quick, Ortho; S/P cortisone injection   Spinal stenosis at L4-L5 level 2017   bilateral   Squamous cell carcinoma of leg, left 2022   Tear of left acetabular labrum 2006 ?   Dr Julius Bowels   Thyroid nodule    dx by Dr. Jillyn Hidden   Vitamin D deficiency    Past Surgical History:  Procedure Laterality Date   ANKLE SURGERY  1989   Post fracture    DILATION AND CURETTAGE OF UTERUS  1988   FOOT SURGERY     left- bone spurs, Dr Turner Daniels   HAND SURGERY     Thumb surgery x 2   KNEE ARTHROPLASTY     KNEE ARTHROSCOPY Right 2013   LAPAROTOMY  1988   For Dysmenorrhea   REPLACEMENT TOTAL KNEE Right 11/2018   REPLACEMENT TOTAL KNEE Left 04/27/2021   Patient Active Problem List   Diagnosis Date Noted   Chronic back pain 02/25/2022   Sleep difficulties 07/21/2021   Right flank pain 01/21/2021   Tingling of both feet 01/05/2021   Multiple thyroid nodules 07/10/2020   History of SCC (squamous cell carcinoma) of skin 05/16/2020   Thyroid nodule-benign-no follow-up needed 12/12/2019   Spondylosis without myelopathy or radiculopathy,  lumbar region 03/14/2019   Chest tightness 10/05/2018   Primary osteoarthritis of both knees 12/09/2016   Plantar fasciitis 12/09/2016   Primary osteoarthritis of both feet 12/09/2016   Primary osteoarthritis of both hands 12/09/2016   DDD (degenerative disc disease), lumbar 12/09/2016   Lateral epicondylitis, right elbow 12/09/2016   Lumbar radiculopathy 02/25/2016   DDD (degenerative disc disease), cervical 10/19/2012   PVCs (premature ventricular contractions) 03/12/2011   Sebaceous cyst 10/14/2010   Vitamin D deficiency 01/01/2008   DJD (degenerative joint disease), multiple sites 01/01/2008    PCP: Pincus Sanes, MD  REFERRING PROVIDER: Patton Salles, MD  REFERRING DIAG: N39.3 (ICD-10-CM) - Stress incontinence R10.31 (ICD-10-CM) - Right inguinal pain  THERAPY DIAG:  Pelvic pain  Other muscle spasm  Muscle weakness (generalized)  Unspecified lack of coordination  Abnormal posture  Rationale for Evaluation and Treatment: Rehabilitation  ONSET DATE: 09/15/20  SUBJECTIVE:  SUBJECTIVE STATEMENT: Pt states that she feels like she is doing better and she hasn't really hurt since after her first appointment.   Fluid intake: Yes: 32-40 oz water, tea     PAIN: 06/13/23 Are you having pain? Yes NPRS scale: 2/10 Pain location:  pelvic pain/low back pain  Pain type: irritating Pain description: intermittent   Aggravating factors: wakes her up at night, pulling shoe off, prolonged sitting (back), transition from seated to standing Relieving factors: movement  PRECAUTIONS: None  RED FLAGS: None   WEIGHT BEARING RESTRICTIONS: No  FALLS:  Has patient fallen in last 6 months? No  LIVING ENVIRONMENT: Lives with: lives alone Lives in: House/apartment   OCCUPATION:  horticultural therapy  PLOF: Independent  PATIENT GOALS: decrease pain.   PERTINENT HISTORY:  Bil TKA Sexual abuse: No  BOWEL MOVEMENT: Pain with bowel movement: No Type of bowel movement:Frequency every other day and Strain No Fully empty rectum: Yes: - Leakage: No Pads: No Fiber supplement: No  URINATION: Pain with urination: No Fully empty bladder: Yes: - Stream:  Normal Urgency: No Frequency: WNL Leakage: Coughing, Sneezing, and Laughing when bladder is really full Pads: No  INTERCOURSE: Not sexually active   PREGNANCY: NA  PROLAPSE: None   OBJECTIVE:  Note: Objective measures were completed at Evaluation unless otherwise noted.  03/21/22: PFIQ-7: 43 COGNITION: Overall cognitive status: Within functional limits for tasks assessed     SENSATION: Light touch: Appears intact Proprioception: Appears intact  FUNCTIONAL TESTS:  Squat: Large Rt weight shift Single leg stance:  Rt: Lt pelvic drop  Lt: Rt pelvic drop  GAIT: Comments: WNL  POSTURE: rounded shoulders, forward head, decreased lumbar lordosis, decreased thoracic kyphosis, posterior pelvic tilt, and elevated Lt shoulder, elevated Rt iliac crest, Rt lumbar scoliosis   LUMBARAROM/PROM:  A/PROM A/PROM  Eval (% available)  Flexion 80  Extension 20  Right lateral flexion 75  Left lateral flexion 75  Right rotation 25  Left rotation 25   (Blank rows = not tested)   PALPATION:   General  No tenderness in abdomen; Bil adductor restriction                External Perineal Exam WNL                             Internal Pelvic Floor decreased muscle tone in Lt deep pelvic floor, tenderness throughout, trigger points present on Lt, pain and spasm in Lt obturator internus  Patient confirms identification and approves PT to assess internal pelvic floor and treatment Yes  PELVIC MMT:   MMT eval  Vaginal 2/5, 5 second endurance, 6 repeat contraction; difficulty with full relaxation  Diastasis  Recti 1-2 finger width separation without distortion  (Blank rows = not tested)        TONE: Lt low with areas of trigger points and spasm, Rt WNL  PROLAPSE: WNL  TODAY'S TREATMENT:  DATE:  06/13/23 Manual: Pt provides verbal consent for internal vaginal/rectal pelvic floor exam. Internal Lt obturator internus release and deep pelvic floor muscle release Instrument assisted soft tissue mobilization to Lt adductors and pelvic attachment points  External obturator internus release Lt Neuromuscular re-education: Quadruped hip hiking 10x bil Seated hip adduction with push pull 2 x 10 Therapeutic activities: Pelvic floor muscle wand training   06/06/23 Manual: Pt provides verbal consent for internal vaginal/rectal pelvic floor exam. Internal vaginal pelvic floor muscle release in supine to LT superficial and deep layers Internal obturator internus Lt trigger point release Soft tissue mobilization to Lt adductors Trigger Point Dry Needling  Initial Treatment: Pt instructed on Dry Needling rational, procedures, and possible side effects. Pt instructed to expect mild to moderate muscle soreness later in the day and/or into the next day.  Pt instructed in methods to reduce muscle soreness. Pt instructed to continue prescribed HEP. Patient was educated on signs and symptoms of infection and other risk factors and advised to seek medical attention should they occur.  Patient verbalized understanding of these instructions and education.   Patient Verbal Consent Given: Yes Education Handout Provided: Yes Muscles Treated: Lt obturator internus and Lt adductors  Electrical Stimulation Performed: No Treatment Response/Outcome: twitch response and release Neuromuscular re-education: Seated resisted march red band with transversus abdominus and pelvic floor muscle 2 x  10 Seated hip adduction ball press with transversus abdominus and pelvic floor muscle 2 x 10 Seated hip abduction red band with transversus abdominus and pelvic floor muscle 2 x 10 Seated hip internal rotation red band with transversus abdominus and pelvic floor muscle 2 x 10  05/30/23 Manual: Pt provides verbal consent for internal vaginal/rectal pelvic floor exam. Internal vaginal pelvic floor muscle release in supine to LT superficial and deep layers Internal obturator internus Lt trigger point release Soft tissue mobilization to Lt adductors Trigger Point Dry Needling  Initial Treatment: Pt instructed on Dry Needling rational, procedures, and possible side effects. Pt instructed to expect mild to moderate muscle soreness later in the day and/or into the next day.  Pt instructed in methods to reduce muscle soreness. Pt instructed to continue prescribed HEP. Patient was educated on signs and symptoms of infection and other risk factors and advised to seek medical attention should they occur.  Patient verbalized understanding of these instructions and education.   Patient Verbal Consent Given: Yes Education Handout Provided: Yes Muscles Treated: Lt obturator internus and Lt adductors  Electrical Stimulation Performed: No Treatment Response/Outcome: twitch response and release Exercises: Standing adductor stretch 2 x 60 sec bil Frog stretch 2 min Seated piriformis stretch 60 sec bil Prone windshield wipers Therapeutic activities: Pelvic floor muscle wand education     PATIENT EDUCATION:  Education details: See above Person educated: Patient Education method: Programmer, multimedia, Demonstration, Actor cues, Verbal cues, and Handouts Education comprehension: verbalized understanding  HOME EXERCISE PROGRAM: [id number]  ASSESSMENT:  CLINICAL IMPRESSION: Pt doing much better and states that she has not had severe pain since her first session. We did manual techniques to release some  continued restriction surrounding Lt pelvis and pelvic floor muscles, but tenison is better. Instrument assisted soft tissue mobilizatoin performed to Lt adductors and pelvic attachments getting into fascial restriction very well. She tolerated pelvic motor control exercise in seated and quadruped well today and HEP updated. She was able to use pelvic floor muscle wand with instruction to feel more comfortable. She will continue to benefit from skilled PT intervention in order to  decrease pelvic pain and begin/progress functional strengthening program.   OBJECTIVE IMPAIRMENTS: decreased activity tolerance, decreased coordination, decreased endurance, decreased strength, increased fascial restrictions, increased muscle spasms, impaired tone, postural dysfunction, and pain.   ACTIVITY LIMITATIONS: continence  PARTICIPATION LIMITATIONS: community activity and occupation  PERSONAL FACTORS: 1 comorbidity: medical history  are also affecting patient's functional outcome.   REHAB POTENTIAL: Good  CLINICAL DECISION MAKING: Stable/uncomplicated  EVALUATION COMPLEXITY: Low   GOALS: Goals reviewed with patient? Yes  SHORT TERM GOALS: Target date: 04/19/23 - updated 05/30/23  Pt will be independent with HEP.   Baseline: Goal status: IN PROGRESS 05/30/23  2.  Pt will be able to perform single leg stance of Lt LE and pull Rt shoe off and perform Lt rotation without any increase in Rt pelvic pain.  Baseline:  Goal status: IN PROGRESS 05/30/23  3.  Pt will be independent with diaphragmatic breathing and down training activities in order to improve pelvic floor relaxation.  Baseline:  Goal status: IN PROGRESS 05/30/23  4.  Pt will report pelvic pain no greater than 4/10. Baseline:  Goal status: IN PROGRESS 05/30/23  5.   Baseline:  Goal status: IN PROGRESS 05/30/23   LONG TERM GOALS: Target date: 09/06/23 - updated 05/30/23  Pt will be independent with advanced HEP.   Baseline:  Goal status:  IN PROGRESS 05/30/23  2.  Pt will be able to perform all gardening activities without any increase in Rt pelvic pain.  Baseline:  Goal status: IN PROGRESS 05/30/23  3.  Pt will increase all impaired lumbar A/ROM by 25% in order to help restore normal lumbopelvic balance.  Baseline:  Goal status: IN PROGRESS 05/30/23  4.  Pt will report no leaks with laughing, coughing, sneezing in order to improve comfort with interpersonal relationships and community activities.   Baseline:  Goal status: IN PROGRESS 05/30/23  5.  Pt will demonstrate good pelvic stability in single leg stance and no weight shift in squat to show improved core strength. Baseline:  Goal status: IN PROGRESS 05/30/23  6.  Pt will report pelvic pain no greater than 2/10.  Baseline:  Goal status: IN PROGRESS 05/30/23  PLAN:  PT FREQUENCY: 1-2x/week  PT DURATION: 6 months  PLANNED INTERVENTIONS: 97110-Therapeutic exercises, 97530- Therapeutic activity, 97112- Neuromuscular re-education, 97535- Self Care, 16109- Manual therapy, Dry Needling, and Biofeedback  PLAN FOR NEXT SESSION: Internal vaginal Lt pelvic floor muscle release to pt tolerance; manual techniques to bil adductors; adductor stretches; begin core training. Dry needle Lt glutes.    Julio Alm, PT, DPT03/31/254:18 PM

## 2023-06-20 ENCOUNTER — Ambulatory Visit: Payer: BC Managed Care – PPO | Attending: Obstetrics and Gynecology

## 2023-06-20 DIAGNOSIS — R293 Abnormal posture: Secondary | ICD-10-CM | POA: Insufficient documentation

## 2023-06-20 DIAGNOSIS — R279 Unspecified lack of coordination: Secondary | ICD-10-CM | POA: Diagnosis present

## 2023-06-20 DIAGNOSIS — M62838 Other muscle spasm: Secondary | ICD-10-CM | POA: Insufficient documentation

## 2023-06-20 DIAGNOSIS — M6281 Muscle weakness (generalized): Secondary | ICD-10-CM | POA: Diagnosis present

## 2023-06-20 DIAGNOSIS — R102 Pelvic and perineal pain: Secondary | ICD-10-CM | POA: Insufficient documentation

## 2023-06-20 NOTE — Therapy (Unsigned)
 OUTPATIENT PHYSICAL THERAPY FEMALE PELVIC TREATMENT   Patient Name: Michele Bennett MRN: [id number] DOB:February 17, 1961, 63 y.o., female Today's Date: 06/20/2023  END OF SESSION:  PT End of Session - 06/20/23 1534     Visit Number 5    Date for PT Re-Evaluation 09/06/23    Authorization Type BCBS    Authorization Time Period 05/25/2023-06/23/2023    Authorization - Visit Number 4    Authorization - Number of Visits 4    PT Start Time 1530    PT Stop Time 1610    PT Time Calculation (min) 40 min             Past Medical History:  Diagnosis Date   Allergy    Arthritis    Cancer (HCC) 2019   basal cell on chest and back   Cyst    hip; probable epidemoid inclusion cyst; resolution with antibiotics   Right knee meniscal tear  07/2011   Dr Elita Quick, Ortho; S/P cortisone injection   Spinal stenosis at L4-L5 level 2017   bilateral   Squamous cell carcinoma of leg, left 2022   Tear of left acetabular labrum 2006 ?   Dr Julius Bowels   Thyroid nodule    dx by Dr. Jillyn Hidden   Vitamin D deficiency    Past Surgical History:  Procedure Laterality Date   ANKLE SURGERY  1989   Post fracture    DILATION AND CURETTAGE OF UTERUS  1988   FOOT SURGERY     left- bone spurs, Dr Turner Daniels   HAND SURGERY     Thumb surgery x 2   KNEE ARTHROPLASTY     KNEE ARTHROSCOPY Right 2013   LAPAROTOMY  1988   For Dysmenorrhea   REPLACEMENT TOTAL KNEE Right 11/2018   REPLACEMENT TOTAL KNEE Left 04/27/2021   Patient Active Problem List   Diagnosis Date Noted   Chronic back pain 02/25/2022   Sleep difficulties 07/21/2021   Right flank pain 01/21/2021   Tingling of both feet 01/05/2021   Multiple thyroid nodules 07/10/2020   History of SCC (squamous cell carcinoma) of skin 05/16/2020   Thyroid nodule-benign-no follow-up needed 12/12/2019   Spondylosis without myelopathy or radiculopathy, lumbar region 03/14/2019   Chest tightness 10/05/2018   Primary osteoarthritis of both knees 12/09/2016   Plantar  fasciitis 12/09/2016   Primary osteoarthritis of both feet 12/09/2016   Primary osteoarthritis of both hands 12/09/2016   DDD (degenerative disc disease), lumbar 12/09/2016   Lateral epicondylitis, right elbow 12/09/2016   Lumbar radiculopathy 02/25/2016   DDD (degenerative disc disease), cervical 10/19/2012   PVCs (premature ventricular contractions) 03/12/2011   Sebaceous cyst 10/14/2010   Vitamin D deficiency 01/01/2008   DJD (degenerative joint disease), multiple sites 01/01/2008    PCP: Pincus Sanes, MD  REFERRING PROVIDER: Patton Salles, MD  REFERRING DIAG: N39.3 (ICD-10-CM) - Stress incontinence R10.31 (ICD-10-CM) - Right inguinal pain  THERAPY DIAG:  Pelvic pain  Other muscle spasm  Muscle weakness (generalized)  Unspecified lack of coordination  Abnormal posture  Rationale for Evaluation and Treatment: Rehabilitation  ONSET DATE: 09/15/20  SUBJECTIVE:  SUBJECTIVE STATEMENT: Pt went to [facility] over the weekend and did a lot of walking. She states that she is feeling 98% better from whe nshe came in.   Fluid intake: Yes: 32-40 oz water, tea     PAIN: 06/20/23 Are you having pain? Yes NPRS scale: 1/10 Pain location:  pelvic pain/low back pain  Pain type: irritating Pain description: intermittent   Aggravating factors: wakes her up at night, pulling shoe off, prolonged sitting (back), transition from seated to standing Relieving factors: movement  PRECAUTIONS: None  RED FLAGS: None   WEIGHT BEARING RESTRICTIONS: No  FALLS:  Has patient fallen in last 6 months? No  LIVING ENVIRONMENT: Lives with: lives alone Lives in: House/apartment   OCCUPATION: horticultural therapy  PLOF: Independent  PATIENT GOALS: decrease pain.   PERTINENT HISTORY:  Bil  TKA Sexual abuse: No  BOWEL MOVEMENT: Pain with bowel movement: No Type of bowel movement:Frequency every other day and Strain No Fully empty rectum: Yes: - Leakage: No Pads: No Fiber supplement: No  URINATION: Pain with urination: No Fully empty bladder: Yes: - Stream:  Normal Urgency: No Frequency: WNL Leakage: Coughing, Sneezing, and Laughing when bladder is really full Pads: No  INTERCOURSE: Not sexually active   PREGNANCY: NA  PROLAPSE: None   OBJECTIVE:  Note: Objective measures were completed at Evaluation unless otherwise noted.  03/21/22: PFIQ-7: 43 COGNITION: Overall cognitive status: Within functional limits for tasks assessed     SENSATION: Light touch: Appears intact Proprioception: Appears intact  FUNCTIONAL TESTS:  Squat: Large Rt weight shift Single leg stance:  Rt: Lt pelvic drop  Lt: Rt pelvic drop  GAIT: Comments: WNL  POSTURE: rounded shoulders, forward head, decreased lumbar lordosis, decreased thoracic kyphosis, posterior pelvic tilt, and elevated Lt shoulder, elevated Rt iliac crest, Rt lumbar scoliosis   LUMBARAROM/PROM:  A/PROM A/PROM  Eval (% available)  Flexion 80  Extension 20  Right lateral flexion 75  Left lateral flexion 75  Right rotation 25  Left rotation 25   (Blank rows = not tested)   PALPATION:   General  No tenderness in abdomen; Bil adductor restriction                External Perineal Exam WNL                             Internal Pelvic Floor decreased muscle tone in Lt deep pelvic floor, tenderness throughout, trigger points present on Lt, pain and spasm in Lt obturator internus  Patient confirms identification and approves PT to assess internal pelvic floor and treatment Yes  PELVIC MMT:   MMT eval  Vaginal 2/5, 5 second endurance, 6 repeat contraction; difficulty with full relaxation  Diastasis Recti 1-2 finger width separation without distortion  (Blank rows = not tested)        TONE: Lt low  with areas of trigger points and spasm, Rt WNL  PROLAPSE: WNL  TODAY'S TREATMENT:  DATE:  06/20/23 Manual: Pt provides verbal consent for internal vaginal/rectal pelvic floor exam. Internal Lt obturator internus release and deep pelvic floor muscle release Instrument assisted soft tissue mobilization to Lt adductors and pelvic attachment points  External obturator internus release Lt Exercises: Side lying hip adduction 2 x 10 Therapeutic activities: 3 way lunge on slider 10x each bil Heel slams 10x Jump squats 10x Lateral bound 10x bil   06/13/23 Manual: Pt provides verbal consent for internal vaginal/rectal pelvic floor exam. Internal Lt obturator internus release and deep pelvic floor muscle release Instrument assisted soft tissue mobilization to Lt adductors and pelvic attachment points  External obturator internus release Lt Neuromuscular re-education: Quadruped hip hiking 10x bil Seated hip adduction with push pull 2 x 10 Therapeutic activities: Pelvic floor muscle wand training   06/06/23 Manual: Pt provides verbal consent for internal vaginal/rectal pelvic floor exam. Internal vaginal pelvic floor muscle release in supine to LT superficial and deep layers Internal obturator internus Lt trigger point release Soft tissue mobilization to Lt adductors Trigger Point Dry Needling  Initial Treatment: Pt instructed on Dry Needling rational, procedures, and possible side effects. Pt instructed to expect mild to moderate muscle soreness later in the day and/or into the next day.  Pt instructed in methods to reduce muscle soreness. Pt instructed to continue prescribed HEP. Patient was educated on signs and symptoms of infection and other risk factors and advised to seek medical attention should they occur.  Patient verbalized understanding of these  instructions and education.   Patient Verbal Consent Given: Yes Education Handout Provided: Yes Muscles Treated: Lt obturator internus and Lt adductors  Electrical Stimulation Performed: No Treatment Response/Outcome: twitch response and release Neuromuscular re-education: Seated resisted march red band with transversus abdominus and pelvic floor muscle 2 x 10 Seated hip adduction ball press with transversus abdominus and pelvic floor muscle 2 x 10 Seated hip abduction red band with transversus abdominus and pelvic floor muscle 2 x 10 Seated hip internal rotation red band with transversus abdominus and pelvic floor muscle 2 x 10    PATIENT EDUCATION:  Education details: See above Person educated: Patient Education method: Programmer, multimedia, Demonstration, Actor cues, Verbal cues, and Handouts Education comprehension: verbalized understanding  HOME EXERCISE PROGRAM: [id number]  ASSESSMENT:  CLINICAL IMPRESSION: Pt doing well overall with reporting 98% improvement in her pain. She has good exercises for pain management when she does have increases and is using pelvic floor muscle wand. Due to progress and haivng met goals, she is prepared to discharge at this time; she was encouraged to call with any questions or concerns.   OBJECTIVE IMPAIRMENTS: decreased activity tolerance, decreased coordination, decreased endurance, decreased strength, increased fascial restrictions, increased muscle spasms, impaired tone, postural dysfunction, and pain.   ACTIVITY LIMITATIONS: continence  PARTICIPATION LIMITATIONS: community activity and occupation  PERSONAL FACTORS: 1 comorbidity: medical history  are also affecting patient's functional outcome.   REHAB POTENTIAL: Good  CLINICAL DECISION MAKING: Stable/uncomplicated  EVALUATION COMPLEXITY: Low   GOALS: Goals reviewed with patient? Yes  SHORT TERM GOALS: Target date: 04/19/23 - updated 05/30/23 - updated 06/20/23  Pt will be independent with  HEP.   Baseline: Goal status: MET 06/20/23  2.  Pt will be able to perform single leg stance of Lt LE and pull Rt shoe off and perform Lt rotation without any increase in Rt pelvic pain.  Baseline:  Goal status:MET 06/20/23  3.  Pt will be independent with diaphragmatic breathing and down training activities in order  to improve pelvic floor relaxation.  Baseline:  Goal status: MET 06/20/23  4.  Pt will report pelvic pain no greater than 4/10. Baseline:  Goal status: MET 06/20/23  5.   Baseline:  Goal status: MET 06/20/23   LONG TERM GOALS: Target date: 09/06/23 - updated 05/30/23 - updated 06/20/23  Pt will be independent with advanced HEP.   Baseline:  Goal status: MET 06/20/23  2.  Pt will be able to perform all gardening activities without any increase in Rt pelvic pain.  Baseline:  Goal status: MET 06/20/23  3.  Pt will increase all impaired lumbar A/ROM by 25% in order to help restore normal lumbopelvic balance.  Baseline:  Goal status: MET 06/20/23  4.  Pt will report no leaks with laughing, coughing, sneezing in order to improve comfort with interpersonal relationships and community activities.   Baseline:  Goal status: MET 06/20/23  5.  Pt will demonstrate good pelvic stability in single leg stance and no weight shift in squat to show improved core strength. Baseline:  Goal status: MET 06/20/23  6.  Pt will report pelvic pain no greater than 2/10.  Baseline:  Goal status: MET 06/20/23  PLAN:  PT FREQUENCY: DC  PT DURATION: DC  PLANNED INTERVENTIONS: DC  PLAN FOR NEXT SESSION: DC  PHYSICAL THERAPY DISCHARGE SUMMARY  Visits from Start of Care: 5  Current functional level related to goals / functional outcomes: Independent   Remaining deficits: See above   Education / Equipment: HEP   Patient agrees to discharge. Patient goals were met. Patient is being discharged due to being pleased with the current functional level.  Julio Alm, PT, DPT04/07/253:57  PM

## 2023-07-27 LAB — HM COLONOSCOPY

## 2023-09-08 ENCOUNTER — Encounter: Payer: Self-pay | Admitting: Internal Medicine

## 2023-09-08 NOTE — Progress Notes (Unsigned)
 Outside notes received. Information abstracted. Notes sent to scan.

## 2023-10-04 ENCOUNTER — Encounter: Payer: Self-pay | Admitting: Internal Medicine

## 2023-10-04 NOTE — Patient Instructions (Addendum)
 Blood work was ordered.       Medications changes include :   None    A referral was ordered neurology for evaluation of your snoring and someone will call you to schedule an appointment.     Return in about 1 year (around 10/04/2024) for Physical Exam.    Health Maintenance, Female Adopting a healthy lifestyle and getting preventive care are important in promoting health and wellness. Ask your health care provider about: The right schedule for you to have regular tests and exams. Things you can do on your own to prevent diseases and keep yourself healthy. What should I know about diet, weight, and exercise? Eat a healthy diet  Eat a diet that includes plenty of vegetables, fruits, low-fat dairy products, and lean protein. Do not eat a lot of foods that are high in solid fats, added sugars, or sodium. Maintain a healthy weight Body mass index (BMI) is used to identify weight problems. It estimates body fat based on height and weight. Your health care provider can help determine your BMI and help you achieve or maintain a healthy weight. Get regular exercise Get regular exercise. This is one of the most important things you can do for your health. Most adults should: Exercise for at least 150 minutes each week. The exercise should increase your heart rate and make you sweat (moderate-intensity exercise). Do strengthening exercises at least twice a week. This is in addition to the moderate-intensity exercise. Spend less time sitting. Even light physical activity can be beneficial. Watch cholesterol and blood lipids Have your blood tested for lipids and cholesterol at 62 years of age, then have this test every 5 years. Have your cholesterol levels checked more often if: Your lipid or cholesterol levels are high. You are older than 62 years of age. You are at high risk for heart disease. What should I know about cancer screening? Depending on your health history and family  history, you may need to have cancer screening at various ages. This may include screening for: Breast cancer. Cervical cancer. Colorectal cancer. Skin cancer. Lung cancer. What should I know about heart disease, diabetes, and high blood pressure? Blood pressure and heart disease High blood pressure causes heart disease and increases the risk of stroke. This is more likely to develop in people who have high blood pressure readings or are overweight. Have your blood pressure checked: Every 3-5 years if you are 45-72 years of age. Every year if you are 45 years old or older. Diabetes Have regular diabetes screenings. This checks your fasting blood sugar level. Have the screening done: Once every three years after age 33 if you are at a normal weight and have a low risk for diabetes. More often and at a younger age if you are overweight or have a high risk for diabetes. What should I know about preventing infection? Hepatitis B If you have a higher risk for hepatitis B, you should be screened for this virus. Talk with your health care provider to find out if you are at risk for hepatitis B infection. Hepatitis C Testing is recommended for: Everyone born from [birth year] through 1964. Anyone with known risk factors for hepatitis C. Sexually transmitted infections (STIs) Get screened for STIs, including gonorrhea and chlamydia, if: You are sexually active and are younger than 62 years of age. You are older than 62 years of age and your health care provider tells you that you are at risk for this  type of infection. Your sexual activity has changed since you were last screened, and you are at increased risk for chlamydia or gonorrhea. Ask your health care provider if you are at risk. Ask your health care provider about whether you are at high risk for HIV. Your health care provider may recommend a prescription medicine to help prevent HIV infection. If you choose to take medicine to prevent HIV, you  should first get tested for HIV. You should then be tested every 3 months for as long as you are taking the medicine. Pregnancy If you are about to stop having your period (premenopausal) and you may become pregnant, seek counseling before you get pregnant. Take 400 to 800 micrograms (mcg) of folic acid every day if you become pregnant. Ask for birth control (contraception) if you want to prevent pregnancy. Osteoporosis and menopause Osteoporosis is a disease in which the bones lose minerals and strength with aging. This can result in bone fractures. If you are 51 years old or older, or if you are at risk for osteoporosis and fractures, ask your health care provider if you should: Be screened for bone loss. Take a calcium or vitamin D  supplement to lower your risk of fractures. Be given hormone replacement therapy (HRT) to treat symptoms of menopause. Follow these instructions at home: Alcohol use Do not drink alcohol if: Your health care provider tells you not to drink. You are pregnant, may be pregnant, or are planning to become pregnant. If you drink alcohol: Limit how much you have to: 0-1 drink a day. Know how much alcohol is in your drink. In the [place] one drink equals one 12 oz bottle of beer (355 mL), one 5 oz glass of wine (148 mL), or one 1 oz glass of hard liquor (44 mL). Lifestyle Do not use any products that contain nicotine or tobacco. These products include cigarettes, chewing tobacco, and vaping devices, such as e-cigarettes. If you need help quitting, ask your health care provider. Do not use street drugs. Do not share needles. Ask your health care provider for help if you need support or information about quitting drugs. General instructions Schedule regular health, dental, and eye exams. Stay current with your vaccines. Tell your health care provider if: You often feel depressed. You have ever been abused or do not feel safe at home. Summary Adopting a healthy  lifestyle and getting preventive care are important in promoting health and wellness. Follow your health care provider's instructions about healthy diet, exercising, and getting tested or screened for diseases. Follow your health care provider's instructions on monitoring your cholesterol and blood pressure. This information is not intended to replace advice given to you by your health care provider. Make sure you discuss any questions you have with your health care provider. Document Revised: 07/21/2020 Document Reviewed: 07/21/2020 Elsevier Patient Education  2024 [employer].

## 2023-10-04 NOTE — Progress Notes (Unsigned)
 Subjective:    Patient ID: Michele Bennett, female    DOB: 1960/05/23, 63 y.o.   MRN: [id number]      HPI Alyona is here for a Physical exam and her chronic medical problems.   Snoring - friend told her on a recent trip.  Sleep is still bad.  Seeing orthopedics as needed for back and neck pain.   Medications and allergies reviewed with patient and updated if appropriate.  Current Outpatient Medications on File Prior to Visit  Medication Sig Dispense Refill   acetaminophen (TYLENOL) 500 MG tablet Take by mouth as needed.      Cholecalciferol (VITAMIN D3) 2000 UNITS TABS Take 5,000 Units by mouth daily.     diclofenac  sodium (VOLTAREN ) 1 % GEL Apply 4 g topically 4 (four) times daily. (Patient taking differently: Apply 4 g topically as needed.) 100 g 3   estradiol  (DOTTI ) 0.075 MG/24HR Place 1 patch onto the skin 2 (two) times a week. 24 patch 3   fexofenadine (ALLEGRA) 180 MG tablet Take 180 mg by mouth as needed.     gabapentin (NEURONTIN) 100 MG capsule Take 100 mg by mouth at bedtime.     magnesium oxide (MAG-OX) 400 MG tablet Take 400 mg by mouth daily.     meloxicam  (MOBIC ) 15 MG tablet Take 15 mg by mouth daily.     methocarbamol (ROBAXIN) 500 MG tablet Take 500 mg by mouth 2 (two) times daily.     Misc Natural Products (TART CHERRY ADVANCED PO) Take by mouth.     progesterone  (PROMETRIUM ) 100 MG capsule Take 1 capsule (100 mg total) by mouth at bedtime. 90 capsule 3   triamcinolone  (KENALOG ) 0.1 % paste Use as directed 1 Application in the mouth or throat 2 (two) times daily. 5 g 12   TURMERIC PO Take by mouth daily.     Zinc 25 MG TABS Take 1 tablet by mouth daily. 20MG      No current facility-administered medications on file prior to visit.    Review of Systems  Constitutional:  Negative for fever.  Eyes:  Negative for visual disturbance.  Respiratory:  Negative for cough, shortness of breath and wheezing.   Cardiovascular:  Negative for chest pain,  palpitations and leg swelling.  Gastrointestinal:  Negative for abdominal pain, blood in stool, constipation and diarrhea.       No gerd  Genitourinary:  Negative for dysuria.  Musculoskeletal:  Positive for arthralgias, back pain and neck pain.  Skin:  Negative for rash.  Neurological:  Positive for numbness (left upper leg). Negative for dizziness, light-headedness and headaches.  Psychiatric/Behavioral:  Negative for dysphoric mood. The patient is not nervous/anxious.        Objective:   Vitals:   10/05/23 0842  BP: 122/76  Pulse: 60  Temp: 98.7 F (37.1 C)  SpO2: 99%   Filed Weights   10/05/23 0842  Weight: 147 lb (66.7 kg)   Body mass index is 23.37 kg/m.  BP Readings from Last 3 Encounters:  10/05/23 122/76  10/25/22 118/78  09/03/22 122/70    Wt Readings from Last 3 Encounters:  10/05/23 147 lb (66.7 kg)  10/25/22 149 lb (67.6 kg)  09/03/22 149 lb 9.6 oz (67.9 kg)       Physical Exam Constitutional: She appears well-developed and well-nourished. No distress.  HENT:  Head: Normocephalic and atraumatic.  Right Ear: External ear normal. Normal ear canal and TM Left Ear: External ear normal.  Normal ear  canal and TM Mouth/Throat: Oropharynx is clear and moist.  Eyes: Conjunctivae normal.  Neck: Neck supple. No tracheal deviation present. No thyromegaly present.  No carotid bruit  Cardiovascular: Normal rate, regular rhythm and normal heart sounds.   No murmur heard.  No edema. Pulmonary/Chest: Effort normal and breath sounds normal. No respiratory distress. She has no wheezes. She has no rales.  Breast: deferred   Abdominal: Soft. She exhibits no distension. There is no tenderness.  Lymphadenopathy: She has no cervical adenopathy. MSK: Left winged scapula Skin: Skin is warm and dry. She is not diaphoretic.  Psychiatric: She has a normal mood and affect. Her behavior is normal.     Lab Results  Component Value Date   WBC 4.1 09/09/2022   HGB 11.7  (L) 09/09/2022   HCT 35.6 (L) 09/09/2022   PLT 284.0 09/09/2022   GLUCOSE 88 09/09/2022   CHOL 176 09/09/2022   TRIG 67.0 09/09/2022   HDL 70.30 09/09/2022   LDLCALC 92 09/09/2022   ALT 18 09/09/2022   AST 22 09/09/2022   NA 140 09/09/2022   K 4.1 09/09/2022   CL 104 09/09/2022   CREATININE 1.00 09/09/2022   BUN 15 09/09/2022   CO2 30 09/09/2022   TSH 1.06 09/09/2022   HGBA1C 5.5 01/05/2021         Assessment & Plan:   Physical exam: Screening blood work  ordered Exercise  walking, stretching, working on balance Weight is normal Substance abuse  none   Reviewed recommended immunizations.   Health Maintenance  Topic Date Due   COVID-19 Vaccine (3 - Pfizer risk series) 10/20/2023 (Originally 07/13/2019)   INFLUENZA VACCINE  10/14/2023   MAMMOGRAM  10/27/2023   DTaP/Tdap/Td (3 - Td or Tdap) 12/15/2025   Cervical Cancer Screening (HPV/Pap Cotest)  10/25/2027   Colonoscopy  07/27/2030   Hepatitis C Screening  Completed   HIV Screening  Completed   Zoster Vaccines- Shingrix   Completed   Hepatitis B Vaccines  Aged Out   HPV VACCINES  Aged Out   Meningococcal B Vaccine  Aged Out          See Problem List for Assessment and Plan of chronic medical problems.

## 2023-10-05 ENCOUNTER — Ambulatory Visit (INDEPENDENT_AMBULATORY_CARE_PROVIDER_SITE_OTHER): Admitting: Internal Medicine

## 2023-10-05 VITALS — BP 122/76 | HR 60 | Temp 98.7°F | Ht 66.5 in | Wt 147.0 lb

## 2023-10-05 DIAGNOSIS — E559 Vitamin D deficiency, unspecified: Secondary | ICD-10-CM

## 2023-10-05 DIAGNOSIS — M5030 Other cervical disc degeneration, unspecified cervical region: Secondary | ICD-10-CM | POA: Diagnosis not present

## 2023-10-05 DIAGNOSIS — M958 Other specified acquired deformities of musculoskeletal system: Secondary | ICD-10-CM

## 2023-10-05 DIAGNOSIS — G479 Sleep disorder, unspecified: Secondary | ICD-10-CM

## 2023-10-05 DIAGNOSIS — G8929 Other chronic pain: Secondary | ICD-10-CM

## 2023-10-05 DIAGNOSIS — R0683 Snoring: Secondary | ICD-10-CM

## 2023-10-05 DIAGNOSIS — E041 Nontoxic single thyroid nodule: Secondary | ICD-10-CM | POA: Diagnosis not present

## 2023-10-05 DIAGNOSIS — Z0000 Encounter for general adult medical examination without abnormal findings: Secondary | ICD-10-CM | POA: Diagnosis not present

## 2023-10-05 DIAGNOSIS — M549 Dorsalgia, unspecified: Secondary | ICD-10-CM

## 2023-10-05 NOTE — Assessment & Plan Note (Signed)
 New She was not aware of this she sees orthopedics but was not mentioned Possibly related to muscle tension left side of neck

## 2023-10-05 NOTE — Assessment & Plan Note (Addendum)
 Chronic Seeing endocrine -- has had changes in the nodules No biopsy to date Endo is watching them Check Tfts,  lipids, CMP, CBC

## 2023-10-05 NOTE — Assessment & Plan Note (Signed)
 Chronic Taking vitamin D daily Check vitamin D level

## 2023-10-05 NOTE — Assessment & Plan Note (Signed)
 New Went on a trip with a friend recently and was told that she snored She does have's poor sleep quality-when she wakes up she has difficulty going up sleep Will refer to neurology for evaluation of possible OSA

## 2023-10-05 NOTE — Assessment & Plan Note (Signed)
Chronic Following with orthopedics  

## 2023-10-05 NOTE — Assessment & Plan Note (Addendum)
 Chronic No difficulty falling asleep -- issues staying asleep  Has been taking gabapentin 100 mg nightly and it does help, but Michele Bennett is concerned about possible memory issues and did stop it a few days ago Taking Tylenol at night OTC meds and trazodone  not effective Ambien helps some Discussed options Continue gabapentin 100 mg nightly since this is probably low risk Michele Bennett was told recently that Michele Bennett snores-Will refer for sleep apnea evaluation

## 2023-10-05 NOTE — Assessment & Plan Note (Signed)
 Chronic neck pain Following with orthopedics

## 2023-10-06 ENCOUNTER — Other Ambulatory Visit (INDEPENDENT_AMBULATORY_CARE_PROVIDER_SITE_OTHER)

## 2023-10-06 ENCOUNTER — Ambulatory Visit: Payer: Self-pay | Admitting: Internal Medicine

## 2023-10-06 DIAGNOSIS — E041 Nontoxic single thyroid nodule: Secondary | ICD-10-CM | POA: Diagnosis not present

## 2023-10-06 DIAGNOSIS — E559 Vitamin D deficiency, unspecified: Secondary | ICD-10-CM

## 2023-10-06 LAB — CBC WITH DIFFERENTIAL/PLATELET
Basophils Absolute: 0.1 K/uL (ref 0.0–0.1)
Basophils Relative: 1.2 % (ref 0.0–3.0)
Eosinophils Absolute: 0.1 K/uL (ref 0.0–0.7)
Eosinophils Relative: 2.2 % (ref 0.0–5.0)
HCT: 38.3 % (ref 36.0–46.0)
Hemoglobin: 12.9 g/dL (ref 12.0–15.0)
Lymphocytes Relative: 35.2 % (ref 12.0–46.0)
Lymphs Abs: 1.7 K/uL (ref 0.7–4.0)
MCHC: 33.6 g/dL (ref 30.0–36.0)
MCV: 94 fl (ref 78.0–100.0)
Monocytes Absolute: 0.5 K/uL (ref 0.1–1.0)
Monocytes Relative: 10.1 % (ref 3.0–12.0)
Neutro Abs: 2.5 K/uL (ref 1.4–7.7)
Neutrophils Relative %: 51.3 % (ref 43.0–77.0)
Platelets: 290 K/uL (ref 150.0–400.0)
RBC: 4.07 Mil/uL (ref 3.87–5.11)
RDW: 13.7 % (ref 11.5–15.5)
WBC: 4.8 K/uL (ref 4.0–10.5)

## 2023-10-06 LAB — LIPID PANEL
Cholesterol: 175 mg/dL (ref 0–200)
HDL: 74.3 mg/dL (ref >39.00)
LDL Cholesterol: 89 mg/dL (ref 0–99)
NonHDL: 100.2
Total CHOL/HDL Ratio: 2
Triglycerides: 56 mg/dL (ref 0.0–149.0)
VLDL: 11.2 mg/dL (ref 0.0–40.0)

## 2023-10-06 LAB — COMPREHENSIVE METABOLIC PANEL WITH GFR
ALT: 19 U/L (ref 0–35)
AST: 20 U/L (ref 0–37)
Albumin: 4.3 g/dL (ref 3.5–5.2)
Alkaline Phosphatase: 62 U/L (ref 39–117)
BUN: 14 mg/dL (ref 6–23)
CO2: 28 meq/L (ref 19–32)
Calcium: 9.5 mg/dL (ref 8.4–10.5)
Chloride: 105 meq/L (ref 96–112)
Creatinine, Ser: 1.01 mg/dL (ref 0.40–1.20)
GFR: 59.94 mL/min — ABNORMAL LOW (ref >60.00)
Glucose, Bld: 101 mg/dL — ABNORMAL HIGH (ref 70–99)
Potassium: 3.8 meq/L (ref 3.5–5.1)
Sodium: 140 meq/L (ref 135–145)
Total Bilirubin: 0.7 mg/dL (ref 0.2–1.2)
Total Protein: 6.8 g/dL (ref 6.0–8.3)

## 2023-10-06 LAB — VITAMIN D 25 HYDROXY (VIT D DEFICIENCY, FRACTURES): VITD: 58.47 ng/mL (ref 30.00–100.00)

## 2023-10-06 LAB — T4, FREE: Free T4: 0.92 ng/dL (ref 0.60–1.60)

## 2023-10-06 LAB — TSH: TSH: 0.8 u[IU]/mL (ref 0.35–5.50)

## 2023-10-19 ENCOUNTER — Other Ambulatory Visit: Payer: Self-pay | Admitting: Nurse Practitioner

## 2023-10-19 DIAGNOSIS — Z79890 Hormone replacement therapy (postmenopausal): Secondary | ICD-10-CM

## 2023-10-19 NOTE — Telephone Encounter (Signed)
 Med refill request: Dotti  patches  Last AEX: 10/25/22 Next AEX: 10/26/23 Last MMG (if hormonal med) 10/27/22 BIRADS Cat 1 neg  Refill authorized: Last rx 10/25/22 #24 with 3 refills. Please approve or deny

## 2023-10-21 ENCOUNTER — Other Ambulatory Visit: Payer: Self-pay | Admitting: Obstetrics and Gynecology

## 2023-10-21 DIAGNOSIS — Z1231 Encounter for screening mammogram for malignant neoplasm of breast: Secondary | ICD-10-CM

## 2023-10-25 NOTE — Progress Notes (Signed)
 63 y.o. G0P0000 Single Caucasian female here for annual exam.    Taking HRT.  No bleeding.  Vasomotor symptoms are controlled.  She would like to continue.   No urinary incontinence.   Dealing with arthritis and spine issues.   Doing house projects.   PCP: Geofm Glade PARAS, MD  Endocrinology:  Dr. Jannetta GLENWOOD [facility]  Patient's last menstrual period was 02/23/2018 (approximate).           Sexually active: No.  The current method of family planning is post menopausal status.    Menopausal hormone therapy:  Dotti  and progesterone   Exercising: Yes.    Walking, stretching, gardening  Smoker:  no  OB History  Gravida Para Term Preterm AB Living  0 0 0 0 0 0  SAB IAB Ectopic Multiple Live Births  0 0 0 0 0     HEALTH MAINTENANCE: Last 2 paps:  10/25/22 neg, HR HPV neg, 04/22/17 neg, HPV neg History of abnormal Pap or positive HPV:  no Mammogram:   10/27/22 Breast Density Cat B, BIRADS Cat 1 neg.  Has appointment in a couple of weeks.   Colonoscopy:  07/27/23 - normal - Dr. Kristie - due in 10 years per patient.  Bone Density:  12/12/19  Result  normal    Immunization History  Administered Date(s) Administered   Influenza Split 12/21/2011   Influenza Whole 12/20/2007, 12/09/2008   Influenza,inj,Quad PF,6+ Mos 12/20/2012, 01/02/2015, 12/16/2015, 12/09/2016, 01/10/2018, 12/22/2018, 12/21/2019, 12/31/2020, 02/15/2022   PFIZER(Purple Top)SARS-COV-2 Vaccination 05/25/2019, 06/15/2019   Td 10/04/2005   Tdap 12/16/2015   Zoster Recombinant(Shingrix ) 01/10/2018, 03/17/2018      reports that she has never smoked. She has never used smokeless tobacco. She reports current alcohol use. She reports that she does not use drugs.  Past Medical History:  Diagnosis Date   Allergy    Arthritis    Cancer (HCC) 2019   basal cell on chest and back   Cyst    hip; probable epidemoid inclusion cyst; resolution with antibiotics   Right knee meniscal tear  07/2011   Dr Lesleigh, Ortho; S/P  cortisone injection   Spinal stenosis at L4-L5 level 2017   bilateral   Squamous cell carcinoma of leg, left 2022   Tear of left acetabular labrum 2006 ?   Dr Elihue   Thyroid  nodule    dx by Dr. Baird   Vitamin D  deficiency     Past Surgical History:  Procedure Laterality Date   ANKLE SURGERY  1989   Post fracture    DILATION AND CURETTAGE OF UTERUS  1988   FOOT SURGERY     left- bone spurs, Dr Liam   HAND SURGERY     Thumb surgery x 2   KNEE ARTHROPLASTY     KNEE ARTHROSCOPY Right 2013   LAPAROTOMY  1988   For Dysmenorrhea   REPLACEMENT TOTAL KNEE Right 11/2018   REPLACEMENT TOTAL KNEE Left 04/27/2021    Current Outpatient Medications  Medication Sig Dispense Refill   acetaminophen (TYLENOL) 500 MG tablet Take by mouth as needed.      Cholecalciferol (VITAMIN D3) 2000 UNITS TABS Take 5,000 Units by mouth daily.     diclofenac  sodium (VOLTAREN ) 1 % GEL Apply 4 g topically 4 (four) times daily. (Patient taking differently: Apply 4 g topically as needed.) 100 g 3   fexofenadine (ALLEGRA) 180 MG tablet Take 180 mg by mouth as needed.     gabapentin (NEURONTIN) 100 MG capsule Take 100  mg by mouth at bedtime.     magnesium oxide (MAG-OX) 400 MG tablet Take 400 mg by mouth daily.     meloxicam  (MOBIC ) 15 MG tablet Take 15 mg by mouth daily.     Misc Natural Products (TART CHERRY ADVANCED PO) Take by mouth.     triamcinolone  (KENALOG ) 0.1 % paste Use as directed 1 Application in the mouth or throat 2 (two) times daily. 5 g 12   TURMERIC PO Take by mouth daily.     Zinc 25 MG TABS Take 1 tablet by mouth daily. 20MG      [START ON 10/27/2023] estradiol  (DOTTI ) 0.075 MG/24HR Place 1 patch onto the skin 2 (two) times a week. 24 patch 3   methocarbamol (ROBAXIN) 500 MG tablet Take 500 mg by mouth 2 (two) times daily. (Patient not taking: Reported on 10/26/2023)     progesterone  (PROMETRIUM ) 100 MG capsule Take 1 capsule (100 mg total) by mouth at bedtime. 90 capsule 3   No current  facility-administered medications for this visit.    ALLERGIES: Celebrex [celecoxib]  Family History  Problem Relation Age of Onset   Parkinsonism Father    Colon polyps Father        No colon cancer   Transient ischemic attack Father    Breast cancer Mother 79   Coronary artery disease Mother        ? radiation related   Arthritis Mother        DJD   Kidney disease Mother    Cancer Mother    Hypertension Mother    Hyperlipidemia Mother    Thyroid  disease Mother    Arthritis Maternal Grandmother        DJD   Diabetes Maternal Grandfather    Heart attack Neg Hx     Review of Systems  All other systems reviewed and are negative.   PHYSICAL EXAM:  BP 122/84   Pulse 73   Ht 5' 7.5 (1.715 m)   Wt 148 lb (67.1 kg)   LMP 02/23/2018 (Approximate)   SpO2 99%   BMI 22.84 kg/m     General appearance: alert, cooperative and appears stated age Head: normocephalic, without obvious abnormality, atraumatic Neck: no adenopathy, supple, symmetrical, trachea midline and thyroid  normal to inspection and palpation Lungs: clear to auscultation bilaterally Breasts: normal appearance, no masses or tenderness, No nipple retraction or dimpling, No nipple discharge or bleeding, No axillary adenopathy Heart: regular rate and rhythm Abdomen: soft, non-tender; no masses, no organomegaly Extremities: extremities normal, atraumatic, no cyanosis or edema Skin: skin color, texture, turgor normal. No rashes or lesions Lymph nodes: cervical, supraclavicular, and axillary nodes normal. Neurologic: grossly normal  Pelvic: External genitalia:  no lesions              No abnormal inguinal nodes palpated.              Urethra:  normal appearing urethra with no masses, tenderness or lesions              Bartholins and Skenes: normal                 Vagina: normal appearing vagina with normal color and discharge, no lesions              Cervix: no lesions              Pap taken: no Bimanual Exam:   Uterus:  normal size, contour, position, consistency, mobility, non-tender  Adnexa: no mass, fullness, tenderness              Rectal exam: yes.  Confirms.              Anus:  normal sphincter tone, no lesions  Chaperone was present for exam:  Dereck BROCKS, CMA  ASSESSMENT: Well woman visit with gynecologic exam. HRT. Fibroids.  Cervical stenosis.  FH breast cancer on mother.  HSV 2.  No outbreaks.   PHQ-2-9: 0  PLAN: Mammogram screening discussed. Self breast awareness reviewed. Pap and HRV collected:  no.  Due in 2029. Guidelines for Calcium, Vitamin D , regular exercise program including cardiovascular and weight bearing exercise. Discused WHI and use of HRT which can increase risk of PE, DVT, MI, stroke and breast cancer.  Medication refills:  Dotti  0.075 mg twice weekly, ##24, RF 3 and Prometrium  100 mg q hs, #90, RF 3.  Labs with PCP.  Follow up:  yearly and prn.

## 2023-10-26 ENCOUNTER — Encounter: Payer: Self-pay | Admitting: Obstetrics and Gynecology

## 2023-10-26 ENCOUNTER — Ambulatory Visit (INDEPENDENT_AMBULATORY_CARE_PROVIDER_SITE_OTHER): Payer: BC Managed Care – PPO | Admitting: Obstetrics and Gynecology

## 2023-10-26 VITALS — BP 122/84 | HR 73 | Ht 67.5 in | Wt 148.0 lb

## 2023-10-26 DIAGNOSIS — Z1331 Encounter for screening for depression: Secondary | ICD-10-CM

## 2023-10-26 DIAGNOSIS — Z79890 Hormone replacement therapy (postmenopausal): Secondary | ICD-10-CM

## 2023-10-26 DIAGNOSIS — Z01419 Encounter for gynecological examination (general) (routine) without abnormal findings: Secondary | ICD-10-CM | POA: Diagnosis not present

## 2023-10-26 MED ORDER — PROGESTERONE MICRONIZED 100 MG PO CAPS: 100.0000 mg | ORAL_CAPSULE | Freq: Every evening | ORAL | 3 refills | Status: AC

## 2023-10-26 MED ORDER — ESTRADIOL 0.075 MG/24HR TD PTTW: 1.0000 | MEDICATED_PATCH | TRANSDERMAL | 3 refills | Status: AC

## 2023-10-26 NOTE — Patient Instructions (Signed)

## 2023-11-07 ENCOUNTER — Encounter: Payer: Self-pay | Admitting: Neurology

## 2023-11-07 ENCOUNTER — Ambulatory Visit
Admission: RE | Admit: 2023-11-07 | Discharge: 2023-11-07 | Disposition: A | Discharge status: Home / Self Care | Source: Ambulatory Visit | Attending: Obstetrics and Gynecology | Admitting: Obstetrics and Gynecology

## 2023-11-07 ENCOUNTER — Ambulatory Visit (INDEPENDENT_AMBULATORY_CARE_PROVIDER_SITE_OTHER): Admitting: Neurology

## 2023-11-07 VITALS — BP 121/73 | HR 64 | Ht 67.0 in | Wt 149.0 lb

## 2023-11-07 DIAGNOSIS — G4700 Insomnia, unspecified: Secondary | ICD-10-CM | POA: Diagnosis not present

## 2023-11-07 DIAGNOSIS — Z820 Family history of epilepsy and other diseases of the nervous system: Secondary | ICD-10-CM

## 2023-11-07 DIAGNOSIS — R0683 Snoring: Secondary | ICD-10-CM

## 2023-11-07 DIAGNOSIS — Z9189 Other specified personal risk factors, not elsewhere classified: Secondary | ICD-10-CM | POA: Diagnosis not present

## 2023-11-07 DIAGNOSIS — Z1231 Encounter for screening mammogram for malignant neoplasm of breast: Secondary | ICD-10-CM

## 2023-11-07 NOTE — Patient Instructions (Signed)

## 2023-11-07 NOTE — Progress Notes (Signed)
 Subjective:    Patient ID: Michele Bennett is a 63 y.o. female.  HPI    True Mar, MD, PhD [facility] [address] [address] [place], [place] [address]  Dear Dr. Geofm,   I saw your patient, Michele Bennett, upon your kind request in my sleep clinic today for initial consultation of her sleep disorder, in particular, concern for underlying obstructive sleep apnea. The patient is unaccompanied today. As you know, Michele Bennett is a 63 year old female with an underlying medical history of allergies, arthritis, status post bilateral knee replacements, basal cell cancer, lumbar spinal stenosis, vitamin D  deficiency, thyroid  nodule, and allergies, who reports chronic difficulty maintaining sleep for the past 2 years or so.  She does have some snoring.  She has a family history of sleep apnea affecting her father.  Her Epworth sleepiness score is 3 out of 24, fatigue severity score is 41 out of 63.  She works as a Hotel manager.  She goes to bed around 11 and rise time is around 8.  She does not sleep through the night.  She has tried over-the-counter melatonin which gave her bad dreams, she tried prescription trazodone  which did not help and Ambien also did not help.  She lives alone, has a puppy in the home. She drinks caffeine in the form of hot tea, 2 cups/day and 2 16 ounce servings of iced tea, typically none after 3 PM.  She does not have a TV in her bedroom.  She does not wake up with headaches, she has no nightly nocturia.  She is a non-smoker and drinks alcohol occasionally.  Her Past Medical History Is Significant For: Past Medical History:  Diagnosis Date   Allergy    Arthritis    Cancer (HCC) 2019   basal cell on chest and back   Cyst    hip; probable epidemoid inclusion cyst; resolution with antibiotics   Right knee meniscal tear  07/2011   Dr Lesleigh, Ortho; S/P cortisone injection   Spinal stenosis at L4-L5 level 2017    bilateral   Squamous cell carcinoma of leg, left 2022   Tear of left acetabular labrum 2006 ?   Dr Elihue   Thyroid  nodule    dx by Dr. Baird   Vitamin D  deficiency     Her Past Surgical History Is Significant For: Past Surgical History:  Procedure Laterality Date   ANKLE SURGERY  1989   Post fracture    DILATION AND CURETTAGE OF UTERUS  1988   FOOT SURGERY     left- bone spurs, Dr Liam   HAND SURGERY     Thumb surgery x 2   KNEE ARTHROPLASTY     KNEE ARTHROSCOPY Right 2013   LAPAROTOMY  1988   For Dysmenorrhea   REPLACEMENT TOTAL KNEE Right 11/2018   REPLACEMENT TOTAL KNEE Left 04/27/2021    Her Family History Is Significant For: Family History  Problem Relation Age of Onset   Breast cancer Mother 32   Coronary artery disease Mother        ? radiation related   Arthritis Mother        DJD   Kidney disease Mother    Cancer Mother    Hypertension Mother    Hyperlipidemia Mother    Thyroid  disease Mother    Parkinsonism Father    Colon polyps Father        No colon cancer   Transient ischemic attack Father  Sleep apnea Father    Arthritis Maternal Grandmother        DJD   Diabetes Maternal Grandfather    Heart attack Neg Hx     Her Social History Is Significant For: Social History   Socioeconomic History   Marital status: Single    Spouse name: Not on file   Number of children: 0   Years of education: Not on file   Highest education level: Not on file  Occupational History   Occupation: Self-employed Hotel manager  Tobacco Use   Smoking status: Never   Smokeless tobacco: Never  Vaping Use   Vaping status: Never Used  Substance and Sexual Activity   Alcohol use: Yes    Alcohol/week: 7.0 standard drinks of alcohol    Types: 7 Cans of beer per week    Comment: 2 glasses of wine or beer a week   Drug use: No   Sexual activity: Not Currently    Partners: Male    Birth control/protection: Post-menopausal  Other Topics Concern   Not  on file  Social History Narrative   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  Walks dog, gardening, PT for back   Social Drivers of Health   Financial Resource Strain: Patient Declined (10/02/2023)   Overall Financial Resource Strain (CARDIA)    Difficulty of Paying Living Expenses: Patient declined  Food Insecurity: Patient Declined (10/02/2023)   Hunger Vital Sign    Worried About Running Out of Food in the Last Year: Patient declined    Ran Out of Food in the Last Year: Patient declined  Transportation Needs: Patient Declined (10/02/2023)   PRAPARE - Administrator, Civil Service (Medical): Patient declined    Lack of Transportation (Non-Medical): Patient declined  Physical Activity: Insufficiently Active (10/02/2023)   Exercise Vital Sign    Days of Exercise per Week: 5 days    Minutes of Exercise per Session: 20 min  Stress: No Stress Concern Present (10/02/2023)   [employer] of Occupational Health - Occupational Stress Questionnaire    Feeling of Stress: Only a little  Social Connections: Unknown (10/02/2023)   Social Connection and Isolation Panel    Frequency of Communication with Friends and Family: Patient declined    Frequency of Social Gatherings with Friends and Family: Patient declined    Attends Religious Services: Patient declined    Database administrator or Organizations: Patient declined    Attends Engineer, structural: Not on file    Marital Status: Never married    Her Allergies Are:  Allergies  Allergen Reactions   Celebrex [Celecoxib] Other (See Comments)    Heart races  :   Her Current Medications Are:  Outpatient Encounter Medications as of 11/07/2023  Medication Sig   acetaminophen (TYLENOL) 500 MG tablet Take by mouth as needed.    Cholecalciferol (VITAMIN D3) 2000 UNITS TABS Take 5,000 Units by mouth daily.   diclofenac  sodium (VOLTAREN ) 1 % GEL Apply 4 g topically 4 (four) times daily. (Patient taking differently: Apply 4 g  topically as needed.)   estradiol  (DOTTI ) 0.075 MG/24HR Place 1 patch onto the skin 2 (two) times a week.   fexofenadine (ALLEGRA) 180 MG tablet Take 180 mg by mouth as needed.   gabapentin (NEURONTIN) 100 MG capsule Take 100 mg by mouth at bedtime.   magnesium oxide (MAG-OX) 400 MG tablet Take 400 mg by mouth daily.   meloxicam  (MOBIC ) 15 MG tablet Take 15 mg by mouth daily. (Patient  taking differently: Take 15 mg by mouth as needed.)   Misc Natural Products (TART CHERRY ADVANCED PO) Take by mouth.   progesterone  (PROMETRIUM ) 100 MG capsule Take 1 capsule (100 mg total) by mouth at bedtime.   triamcinolone  (KENALOG ) 0.1 % paste Use as directed 1 Application in the mouth or throat 2 (two) times daily. (Patient taking differently: Use as directed 1 Application in the mouth or throat as needed.)   TURMERIC PO Take by mouth daily.   Zinc 25 MG TABS Take 1 tablet by mouth daily. 20MG    methocarbamol (ROBAXIN) 500 MG tablet Take 500 mg by mouth 2 (two) times daily. (Patient not taking: Reported on 10/26/2023)   No facility-administered encounter medications on file as of 11/07/2023.  :   Review of Systems:  Out of a complete 14 point review of systems, all are reviewed and negative with the exception of these symptoms as listed below:  Review of Systems  Neurological:        Pt here for sleep consult Pt snores,fatigue Pt denies sleep study,cpap machine,hypertension ,headaches   ESS:3 FSS:41    Objective:  Neurological Exam  Physical Exam Physical Examination:   Vitals:   11/07/23 1110  BP: 121/73  Pulse: 64    General Examination: The patient is a very pleasant 63 y.o. female in no acute distress. She appears well-developed and well-nourished and well groomed.   HEENT: Normocephalic, atraumatic, pupils are equal, round and reactive to light, extraocular tracking is good without limitation to gaze excursion or nystagmus noted. Hearing is grossly intact. Face is symmetric with normal  facial animation. Speech is clear with no dysarthria noted. There is no hypophonia. There is no lip, neck/head, jaw or voice tremor. Neck is supple with full range of passive and active motion. There are no carotid bruits on auscultation. Oropharynx exam reveals: mild mouth dryness, good dental hygiene, mild airway crowding secondary to small airway entry, tonsils small, elongated tongue, Mallampati class III, neck circumference 13 5/8 inches.  Minimal overbite.  Tongue protrudes centrally and palate elevates symmetrically.    Chest: Clear to auscultation without wheezing, rhonchi or crackles noted.  Heart: S1+S2+0, regular and normal without murmurs, rubs or gallops noted.   Abdomen: Soft, non-tender and non-distended.  Extremities: There is no pitting edema in the distal lower extremities bilaterally.  Right ankle area puffy area compared to left, she had surgery to the right ankle.  Skin: Warm and dry without trophic changes noted.   Musculoskeletal: exam reveals no obvious joint deformities.   Neurologically:  Mental status: The patient is awake, alert and oriented in all 4 spheres. Her immediate and remote memory, attention, language skills and fund of knowledge are appropriate. There is no evidence of aphasia, agnosia, apraxia or anomia. Speech is clear with normal prosody and enunciation. Thought process is linear. Mood is normal and affect is normal.  Cranial nerves II - XII are as described above under HEENT exam.  Motor exam: Normal bulk, strength and tone is noted. There is no obvious action or resting tremor.  Fine motor skills and coordination: grossly intact.  Cerebellar testing: No dysmetria or intention tremor. There is no truncal or gait ataxia.  Sensory exam: intact to light touch in the upper and lower extremities.  Gait, station and balance: She stands easily. No veering to one side is noted. No leaning to one side is noted. Posture is age-appropriate and stance is narrow  based. Gait shows normal stride length and normal pace.  No problems turning are noted.   Assessment and Plan:  In summary, Jovanka Westgate is a very pleasant 63 y.o.-year old female with an underlying medical history of allergies, arthritis, status post bilateral knee replacements, basal cell cancer, lumbar spinal stenosis, vitamin D  deficiency, thyroid  nodule, and allergies, who presents for evaluation of her chronic sleep difficulty, particularly maintaining sleep for about 2 years.  A laboratory attended sleep study is typically considered gold standard for evaluation of sleep disordered breathing.   I had a long chat with the patient about my findings and the diagnosis of sleep apnea, particularly OSA, its prognosis and treatment options. We talked about medical/conservative treatments, surgical interventions and non-pharmacological approaches for symptom control. I explained, in particular, the risks and ramifications of untreated moderate to severe OSA, especially with respect to developing cardiovascular disease down the road, including congestive heart failure (CHF), difficult to treat hypertension, cardiac arrhythmias (particularly A-fib), neurovascular complications including TIA, stroke and dementia. Even type 2 diabetes has, in part, been linked to untreated OSA. Symptoms of untreated OSA may include (but may not be limited to) daytime sleepiness, nocturia (i.e. frequent nighttime urination), memory problems, mood irritability and suboptimally controlled or worsening mood disorder such as depression and/or anxiety, lack of energy, lack of motivation, physical discomfort, as well as recurrent headaches, especially morning or nocturnal headaches. We talked about the importance of maintaining a healthy lifestyle and striving for healthy weight. In addition, we talked about the importance of striving for and maintaining good sleep hygiene.  She is advised to scale back on her caffeine intake a  little bit. I recommended a sleep study at this time. I outlined the differences between a laboratory attended sleep study which is considered more comprehensive and accurate over the option of a home sleep test (HST); the latter may lead to underestimation of sleep disordered breathing in some instances and does not help with diagnosing upper airway resistance syndrome and is not accurate enough to diagnose primary central sleep apnea typically. I outlined possible surgical and non-surgical treatment options of OSA, including the use of a positive airway pressure (PAP) device (i.e. CPAP, AutoPAP/APAP or BiPAP in certain circumstances), a custom-made dental device (aka oral appliance, which would require a referral to a specialist dentist or orthodontist typically, and is generally speaking not considered for patients with full dentures or edentulous state), upper airway surgical options, such as traditional UPPP (which is not considered a first-line treatment) or the Inspire device (hypoglossal nerve stimulator, which would involve a referral for consultation with an ENT surgeon, after careful selection, following inclusion criteria - also not first-line treatment). I explained the PAP treatment option to the patient in detail, as this is generally considered first-line treatment.  The patient indicated that she would likely not be willing to try PAP therapy, if the need arises.  We will pick up our discussion about the next steps and treatment options after testing.  We will keep her posted as to the test results by phone call and/or MyChart messaging where possible.  We will plan to follow-up in sleep clinic accordingly as well.  I answered all her questions today and the patient was in agreement.   I encouraged her to call with any interim questions, concerns, problems or updates or email us  through MyChart.  Generally speaking, sleep test authorizations may take up to 2 weeks, sometimes less, sometimes  longer, the patient is encouraged to get in touch with us  if they do not hear back  from the sleep lab staff directly within the next 2 weeks.  Thank you very much for allowing me to participate in the care of this nice patient. If I can be of any further assistance to you please do not hesitate to call me at [phone].  Sincerely,   True Mar, MD, PhD

## 2023-11-10 ENCOUNTER — Ambulatory Visit: Payer: Self-pay | Admitting: Obstetrics and Gynecology

## 2023-12-21 ENCOUNTER — Encounter: Payer: Self-pay | Admitting: Internal Medicine

## 2023-12-21 DIAGNOSIS — M150 Primary generalized (osteo)arthritis: Secondary | ICD-10-CM

## 2024-02-21 ENCOUNTER — Telehealth: Payer: Self-pay | Admitting: Rheumatology

## 2024-02-21 NOTE — Telephone Encounter (Signed)
 Returned patient's call and patient is following up on medical records. I provided patient with medical records phone number and advised they will be sending the requested records.

## 2024-02-21 NOTE — Telephone Encounter (Signed)
 Patient left a voicemail stating she was following up on the paperwork.  Patient requested a return call.

## 2024-03-22 ENCOUNTER — Encounter: Payer: Self-pay | Admitting: Internal Medicine

## 2024-03-25 ENCOUNTER — Encounter: Payer: Self-pay | Admitting: Internal Medicine

## 2024-03-25 NOTE — Patient Instructions (Incomplete)
" ° ° ° ° °  Blood work was ordered.       Medications changes include :   meloxicam  7.5 mg daily as needed    A referral was ordered [facility] and someone will call you to schedule an appointment.     "

## 2024-03-25 NOTE — Progress Notes (Unsigned)
" ° ° °  Subjective:    Patient ID: Michele Bennett, female    DOB: March 27, 1960, 64 y.o.   MRN: [id number]      HPI [name] is here for No chief complaint on file.   Chronic sinus issues   Severe generalized primary OA.    [facility]   [facility]   Medications and allergies reviewed with patient and updated if appropriate.  Medications Ordered Prior to Encounter[1]  Review of Systems     Objective:  There were no vitals filed for this visit. BP Readings from Last 3 Encounters:  11/07/23 121/73  10/26/23 122/84  10/05/23 122/76   Wt Readings from Last 3 Encounters:  11/07/23 149 lb (67.6 kg)  10/26/23 148 lb (67.1 kg)  10/05/23 147 lb (66.7 kg)   There is no height or weight on file to calculate BMI.    Physical Exam         Assessment & Plan:    See Problem List for Assessment and Plan of chronic medical problems.         [1]  Current Outpatient Medications on File Prior to Visit  Medication Sig Dispense Refill   acetaminophen (TYLENOL) 500 MG tablet Take by mouth as needed.      Cholecalciferol (VITAMIN D3) 2000 UNITS TABS Take 5,000 Units by mouth daily.     diclofenac  sodium (VOLTAREN ) 1 % GEL Apply 4 g topically 4 (four) times daily. (Patient taking differently: Apply 4 g topically as needed.) 100 g 3   estradiol  (DOTTI ) 0.075 MG/24HR Place 1 patch onto the skin 2 (two) times a week. 24 patch 3   fexofenadine (ALLEGRA) 180 MG tablet Take 180 mg by mouth as needed.     gabapentin (NEURONTIN) 100 MG capsule Take 100 mg by mouth at bedtime.     magnesium oxide (MAG-OX) 400 MG tablet Take 400 mg by mouth daily.     meloxicam  (MOBIC ) 15 MG tablet Take 15 mg by mouth daily. (Patient taking differently: Take 15 mg by mouth as needed.)     methocarbamol (ROBAXIN) 500 MG tablet Take 500 mg by mouth 2 (two) times daily. (Patient not taking: Reported on 10/26/2023)     Misc Natural Products (TART CHERRY ADVANCED PO) Take by  mouth.     progesterone  (PROMETRIUM ) 100 MG capsule Take 1 capsule (100 mg total) by mouth at bedtime. 90 capsule 3   triamcinolone  (KENALOG ) 0.1 % paste Use as directed 1 Application in the mouth or throat 2 (two) times daily. (Patient taking differently: Use as directed 1 Application in the mouth or throat as needed.) 5 g 12   TURMERIC PO Take by mouth daily.     Zinc 25 MG TABS Take 1 tablet by mouth daily. 20MG      No current facility-administered medications on file prior to visit.   "

## 2024-03-26 ENCOUNTER — Encounter: Payer: Self-pay | Admitting: Internal Medicine

## 2024-03-26 ENCOUNTER — Ambulatory Visit: Admitting: Internal Medicine

## 2024-03-26 VITALS — BP 122/70 | HR 63 | Temp 98.3°F | Ht 67.0 in | Wt 149.0 lb

## 2024-03-26 DIAGNOSIS — M19041 Primary osteoarthritis, right hand: Secondary | ICD-10-CM | POA: Diagnosis not present

## 2024-03-26 DIAGNOSIS — M19042 Primary osteoarthritis, left hand: Secondary | ICD-10-CM | POA: Diagnosis not present

## 2024-03-26 DIAGNOSIS — J329 Chronic sinusitis, unspecified: Secondary | ICD-10-CM

## 2024-03-26 DIAGNOSIS — R944 Abnormal results of kidney function studies: Secondary | ICD-10-CM | POA: Insufficient documentation

## 2024-03-26 DIAGNOSIS — I7300 Raynaud's syndrome without gangrene: Secondary | ICD-10-CM | POA: Insufficient documentation

## 2024-03-26 DIAGNOSIS — M958 Other specified acquired deformities of musculoskeletal system: Secondary | ICD-10-CM

## 2024-03-26 LAB — BASIC METABOLIC PANEL WITH GFR
BUN: 15 mg/dL (ref 6–23)
CO2: 31 meq/L (ref 19–32)
Calcium: 9.4 mg/dL (ref 8.4–10.5)
Chloride: 101 meq/L (ref 96–112)
Creatinine, Ser: 0.93 mg/dL (ref 0.40–1.20)
GFR: 65.96 mL/min
Glucose, Bld: 96 mg/dL (ref 70–99)
Potassium: 3.5 meq/L (ref 3.5–5.1)
Sodium: 138 meq/L (ref 135–145)

## 2024-03-26 LAB — C-REACTIVE PROTEIN: CRP: <0.5 mg/dL — ABNORMAL LOW (ref 1.0–20.0)

## 2024-03-26 LAB — SEDIMENTATION RATE: Sed Rate: 6 mm/h (ref 0–30)

## 2024-03-26 MED ORDER — MELOXICAM 7.5 MG PO TABS: 7.5000 mg | ORAL_TABLET | Freq: Every day | ORAL | 0 refills | Status: AC | PRN

## 2024-03-26 NOTE — Assessment & Plan Note (Signed)
 New Toes turn white intermittently They are frequently cold  Likely raynaud's Good circulation - not PAD Keep warm toes warm -- if they turn white - gently warm them

## 2024-03-26 NOTE — Assessment & Plan Note (Signed)
 Improved Has been doing exercise and had trigger injection Continue regular exercises

## 2024-03-26 NOTE — Assessment & Plan Note (Addendum)
 Chronic Severe with swelling, weakness This limits her greatly No hx of psoriasis Check ANA, RF, esr, crp, CCP Has been seeing rheum but needs to switch due to hospital charges when she would see the doctor Consider referral to different rheum Meloxicam  7.5 mg daily prn - will limit

## 2024-03-26 NOTE — Assessment & Plan Note (Signed)
 Had decreased GFR with recent blood work Taking meloxciam as needed - trying to limit it BMP today

## 2024-03-28 ENCOUNTER — Ambulatory Visit: Payer: Self-pay | Admitting: Internal Medicine

## 2024-03-28 LAB — ANA: Anti Nuclear Antibody (ANA): NEGATIVE

## 2024-03-28 LAB — RHEUMATOID FACTOR: Rheumatoid fact SerPl-aCnc: <10 [IU]/mL

## 2024-03-28 LAB — CYCLIC CITRUL PEPTIDE ANTIBODY, IGG: Cyclic Citrullin Peptide Ab: <16 U

## 2024-04-06 ENCOUNTER — Encounter (INDEPENDENT_AMBULATORY_CARE_PROVIDER_SITE_OTHER): Payer: Self-pay | Admitting: Otolaryngology

## 2024-04-06 ENCOUNTER — Ambulatory Visit (INDEPENDENT_AMBULATORY_CARE_PROVIDER_SITE_OTHER): Admitting: Otolaryngology

## 2024-04-06 VITALS — BP 115/77 | HR 65 | Ht 67.0 in | Wt 146.0 lb

## 2024-04-06 DIAGNOSIS — J343 Hypertrophy of nasal turbinates: Secondary | ICD-10-CM | POA: Diagnosis not present

## 2024-04-06 DIAGNOSIS — J342 Deviated nasal septum: Secondary | ICD-10-CM

## 2024-04-06 DIAGNOSIS — R0981 Nasal congestion: Secondary | ICD-10-CM

## 2024-04-06 DIAGNOSIS — J310 Chronic rhinitis: Secondary | ICD-10-CM | POA: Diagnosis not present

## 2024-04-06 NOTE — Progress Notes (Signed)
 CC: Recurrent nasal drainage and nasal congestion  Discussed the use of AI scribe software for clinical note transcription with the patient, who gave verbal consent to proceed.  History of Present Illness Michele Bennett is a 64 year old female who presents today complaining of chronic nasal drainage and nasal congestion.  For over six months, she has experienced daily morning episodes of blowing her nose with either blood-tinged or thick green mucus. These symptoms are limited to the morning and resolve for the remainder of the day. She denies frank epistaxis or dripping blood, but notes occasional blood mixed with mucus when blowing her nose.  She describes intermittent nasal congestion, with a sensation of pressure or 'bubbles' behind her eyes, and perceives greater obstruction on the right side. She does not have significant difficulty breathing through her nose during the day, but airflow feels uneven. She is prone to annual sinus infections, typically in the fall, which are treated with antibiotics.  She has allergies to dust and mold and uses Allertec (generic Allegra) during the fall for symptom control. She does not tolerate nasal sprays or intranasal medications, and has previously attempted nasal irrigation with a Nettie pot device but found it uncomfortable.  At night, she sometimes experiences dysphagia and significant xerostomia, describing her mouth as feeling 'like there's peanut butter stuck,' which she attributes to mouth breathing due to nasal congestion.  She denies history of nasal polyps, recent infections, or other obstructive symptoms.   Past Medical History:  Diagnosis Date   Allergy    Arthritis    Cancer (HCC) 2019   basal cell on chest and back   Cyst    hip; probable epidemoid inclusion cyst; resolution with antibiotics   Right knee meniscal tear  07/2011   Dr Lesleigh, Ortho; S/P cortisone injection   Spinal stenosis at L4-L5 level 2017   bilateral    Squamous cell carcinoma of leg, left 2022   Tear of left acetabular labrum 2006 ?   Dr Elihue   Thyroid  nodule    dx by Dr. Baird   Vitamin D  deficiency     Past Surgical History:  Procedure Laterality Date   ANKLE SURGERY  1989   Post fracture    DILATION AND CURETTAGE OF UTERUS  1988   FOOT SURGERY     left- bone spurs, Dr Liam   HAND SURGERY     Thumb surgery x 2   KNEE ARTHROPLASTY     KNEE ARTHROSCOPY Right 2013   LAPAROTOMY  1988   For Dysmenorrhea   REPLACEMENT TOTAL KNEE Right 11/2018   REPLACEMENT TOTAL KNEE Left 04/27/2021    Family History  Problem Relation Age of Onset   Breast cancer Mother 46   Coronary artery disease Mother        ? radiation related   Arthritis Mother        DJD   Kidney disease Mother    Cancer Mother    Hypertension Mother    Hyperlipidemia Mother    Thyroid  disease Mother    Parkinsonism Father    Colon polyps Father        No colon cancer   Transient ischemic attack Father    Sleep apnea Father    Arthritis Maternal Grandmother        DJD   Diabetes Maternal Grandfather    Heart attack Neg Hx     Social History:  reports that she has never smoked. She has never used smokeless tobacco.  She reports current alcohol use of about 7.0 standard drinks of alcohol per week. She reports that she does not use drugs.  Allergies: Allergies[1]  Prior to Admission medications  Medication Sig Start Date End Date Taking? Authorizing Provider  acetaminophen (TYLENOL) 500 MG tablet Take by mouth as needed.  11/21/18  Yes [provider]  Cholecalciferol (VITAMIN D3) 2000 UNITS TABS Take 5,000 Units by mouth daily.   Yes [provider]  diclofenac  sodium (VOLTAREN ) 1 % GEL Apply 4 g topically 4 (four) times daily. Patient taking differently: Apply 4 g topically as needed. 01/02/15  Yes Tish Elsie FALCON, MD  estradiol  (DOTTI ) 0.075 MG/24HR Place 1 patch onto the skin 2 (two) times a week. 10/27/23  Yes Cathlyn JAYSON Nikki Bobie FORBES, MD  fexofenadine (ALLEGRA) 180 MG tablet Take 180 mg by mouth as needed.   Yes [provider]  gabapentin (NEURONTIN) 100 MG capsule Take 100 mg by mouth at bedtime.   Yes [provider]  magnesium oxide (MAG-OX) 400 MG tablet Take 400 mg by mouth daily.   Yes [provider]  meloxicam  (MOBIC ) 7.5 MG tablet Take 1 tablet (7.5 mg total) by mouth daily as needed for pain. 03/26/24  Yes Burns, Glade PARAS, MD  Misc Natural Products (TART CHERRY ADVANCED PO) Take by mouth.   Yes [provider]  progesterone  (PROMETRIUM ) 100 MG capsule Take 1 capsule (100 mg total) by mouth at bedtime. 10/26/23  Yes Cathlyn JAYSON Nikki Bobie FORBES, MD  triamcinolone  (KENALOG ) 0.1 % paste Use as directed 1 Application in the mouth or throat 2 (two) times daily. Patient taking differently: Use as directed 1 Application in the mouth or throat as needed. 12/01/21  Yes Burns, Glade PARAS, MD  TURMERIC PO Take by mouth daily.   Yes [provider]  Zinc 25 MG TABS Take 1 tablet by mouth daily. 20MG    Yes [provider]    Blood pressure 115/77, pulse 65, height 5' 7 (1.702 m), weight 146 lb (66.2 kg), last menstrual period 02/23/2018, SpO2 98%. Exam: General: Communicates without difficulty, well nourished, no acute distress. Head: Normocephalic, no evidence injury, no tenderness, facial buttresses intact without stepoff. Face/sinus: No tenderness to palpation and percussion. Facial movement is normal and symmetric. Eyes: PERRL, EOMI. No scleral icterus, conjunctivae clear. Neuro: CN II exam reveals vision grossly intact.  No nystagmus at any point of gaze. Ears: Auricles well formed without lesions.  Ear canals are intact without mass or lesion.  No erythema or edema is appreciated.  The TMs are intact without fluid. Nose: External evaluation reveals normal support and skin without lesions.  Dorsum is intact.  Anterior rhinoscopy reveals congested mucosa over anterior aspect of  inferior turbinates and deviated septum.  No purulence noted. Oral:  Oral cavity and oropharynx are intact, symmetric, without erythema or edema.  Mucosa is moist without lesions. Neck: Full range of motion without pain.  There is no significant lymphadenopathy.  No masses palpable.  Thyroid  bed within normal limits to palpation.  Parotid glands and submandibular glands equal bilaterally without mass.  Trachea is midline. Neuro:  CN 2-12 grossly intact.   Procedure:  Flexible Nasal Endoscopy: Description: Risks, benefits, and alternatives of flexible endoscopy were explained to the patient.  Specific mention was made of the risk of throat numbness with difficulty swallowing, possible bleeding from the nose and mouth, and pain from the procedure.  The patient gave oral consent to proceed.  The flexible scope  was inserted into the right nasal cavity.  Endoscopy of the interior nasal cavity, superior, inferior, and middle meatus was performed. The sphenoid-ethmoid recess was examined. Edematous mucosa was noted.  No polyp, mass, or lesion was appreciated. Nasal septal deviation noted. Olfactory cleft was clear.  Nasopharynx was clear.  Turbinates were hypertrophied but without mass.  The procedure was repeated on the contralateral side with similar findings.  The patient tolerated the procedure well.  Assessment & Plan Chronic rhinitis with nasal mucosal congestion, right with nasal septal deviation, and bilateral inferior turbinate hypertrophy. Chronic mild septal deviation with septal spur, inferior turbinate hypertrophy, and associated allergic rhinitis, presenting as morning nasal congestion, thickened green mucus, and occasional blood-tinged discharge. No evidence of acute sinusitis, polyps, or significant obstruction on endoscopic examination. Septal deviation is mild and does not warrant surgical intervention. Symptoms likely exacerbated by nocturnal mouth breathing, resulting in oral dryness and thickened  secretions. - Initiated daily intranasal corticosteroid spray (Flonase Sensimist, 2 sprays per nostril once daily) with instruction on proper technique to minimize risk of epistaxis. - Recommended over-the-counter nasal saline irrigation (squeeze bottle or automated system such as Navage) to reduce nasal irritation and improve symptoms. - Advised monitoring for changes in ocular symptoms given borderline intraocular pressure and to follow up with ophthalmology as needed. - Provided reassurance regarding absence of infection, polyps, or concerning findings on nasal endoscopy. - Recommended follow-up in several months to assess response to therapy and reconsider management if symptoms persist or worsen.    Michele Bennett W Anne Sebring 04/06/2024, 10:04 AM      [1]  Allergies Allergen Reactions   Celebrex [Celecoxib] Other (See Comments)    Heart races

## 2024-07-04 ENCOUNTER — Ambulatory Visit (INDEPENDENT_AMBULATORY_CARE_PROVIDER_SITE_OTHER): Admitting: Otolaryngology

## 2024-10-29 ENCOUNTER — Ambulatory Visit: Admitting: Obstetrics and Gynecology
